# Patient Record
Sex: Female | Born: 1945 | Race: Black or African American | Hispanic: No | Smoking: Former smoker
Health system: Southern US, Community
[De-identification: ages and names within clinical notes are randomized; demographics above are authoritative.]

## PROBLEM LIST (undated history)

## (undated) ENCOUNTER — Ambulatory Visit: Payer: Medicare HMO

## (undated) DIAGNOSIS — C801 Malignant (primary) neoplasm, unspecified: Secondary | ICD-10-CM

## (undated) DIAGNOSIS — I82409 Acute embolism and thrombosis of unspecified deep veins of unspecified lower extremity: Secondary | ICD-10-CM

## (undated) DIAGNOSIS — Z87442 Personal history of urinary calculi: Secondary | ICD-10-CM

## (undated) DIAGNOSIS — T7840XA Allergy, unspecified, initial encounter: Secondary | ICD-10-CM

## (undated) DIAGNOSIS — D649 Anemia, unspecified: Secondary | ICD-10-CM

## (undated) DIAGNOSIS — C189 Malignant neoplasm of colon, unspecified: Secondary | ICD-10-CM

## (undated) DIAGNOSIS — I499 Cardiac arrhythmia, unspecified: Secondary | ICD-10-CM

## (undated) HISTORY — DX: Anemia, unspecified: D64.9

## (undated) HISTORY — PX: KIDNEY STONE SURGERY: SHX686

## (undated) HISTORY — PX: COLON SURGERY: SHX602

## (undated) HISTORY — PX: TONSILLECTOMY: SUR1361

## (undated) HISTORY — DX: Cardiac arrhythmia, unspecified: I49.9

## (undated) HISTORY — DX: Allergy, unspecified, initial encounter: T78.40XA

## (undated) HISTORY — PX: CHOLECYSTECTOMY: SHX55

## (undated) HISTORY — DX: Malignant neoplasm of colon, unspecified: C18.9

## (undated) HISTORY — PX: OTHER SURGICAL HISTORY: SHX169

## (undated) HISTORY — PX: COLONOSCOPY: SHX174

---

## 1978-06-25 DIAGNOSIS — Z5189 Encounter for other specified aftercare: Secondary | ICD-10-CM

## 1978-06-25 HISTORY — DX: Encounter for other specified aftercare: Z51.89

## 2004-02-06 ENCOUNTER — Other Ambulatory Visit: Payer: Self-pay

## 2004-11-30 ENCOUNTER — Emergency Department (HOSPITAL_COMMUNITY): Admission: EM | Admit: 2004-11-30 | Discharge: 2004-11-30 | Payer: Self-pay | Admitting: *Deleted

## 2005-09-28 ENCOUNTER — Emergency Department (HOSPITAL_COMMUNITY): Admission: EM | Admit: 2005-09-28 | Discharge: 2005-09-28 | Payer: Self-pay | Admitting: Emergency Medicine

## 2006-01-13 ENCOUNTER — Emergency Department (HOSPITAL_COMMUNITY): Admission: EM | Admit: 2006-01-13 | Discharge: 2006-01-13 | Payer: Self-pay | Admitting: Emergency Medicine

## 2008-03-11 ENCOUNTER — Emergency Department (HOSPITAL_COMMUNITY): Admission: EM | Admit: 2008-03-11 | Discharge: 2008-03-11 | Payer: Self-pay | Admitting: Emergency Medicine

## 2008-11-14 ENCOUNTER — Emergency Department (HOSPITAL_COMMUNITY): Admission: EM | Admit: 2008-11-14 | Discharge: 2008-11-14 | Payer: Self-pay | Admitting: Emergency Medicine

## 2010-10-03 LAB — PROTIME-INR
INR: 1 (ref 0.00–1.49)
Prothrombin Time: 13.9 seconds (ref 11.6–15.2)

## 2010-10-03 LAB — BASIC METABOLIC PANEL
CO2: 30 mEq/L (ref 19–32)
Calcium: 9.1 mg/dL (ref 8.4–10.5)
Creatinine, Ser: 0.6 mg/dL (ref 0.4–1.2)
GFR calc Af Amer: >60 mL/min (ref >60)

## 2010-10-03 LAB — CBC
Hemoglobin: 12.9 g/dL (ref 12.0–15.0)
MCV: 88.9 fL (ref 78.0–100.0)
RBC: 4.23 MIL/uL (ref 3.87–5.11)
WBC: 7.1 10*3/uL (ref 4.0–10.5)

## 2010-10-03 LAB — DIFFERENTIAL
Basophils Absolute: 0 10*3/uL (ref 0.0–0.1)
Basophils Relative: 1 % (ref 0–1)
Eosinophils Absolute: 0.2 10*3/uL (ref 0.0–0.7)
Monocytes Relative: 7 % (ref 3–12)
Neutro Abs: 3.1 10*3/uL (ref 1.7–7.7)

## 2011-03-16 ENCOUNTER — Emergency Department (HOSPITAL_COMMUNITY)
Admission: EM | Admit: 2011-03-16 | Discharge: 2011-03-16 | Disposition: A | Discharge status: Home / Self Care | Payer: BC Managed Care – PPO | Attending: Emergency Medicine | Admitting: Emergency Medicine

## 2011-03-16 DIAGNOSIS — R5383 Other fatigue: Secondary | ICD-10-CM | POA: Insufficient documentation

## 2011-03-16 DIAGNOSIS — R42 Dizziness and giddiness: Secondary | ICD-10-CM | POA: Insufficient documentation

## 2011-03-16 DIAGNOSIS — R5381 Other malaise: Secondary | ICD-10-CM | POA: Insufficient documentation

## 2011-03-16 DIAGNOSIS — N390 Urinary tract infection, site not specified: Secondary | ICD-10-CM | POA: Insufficient documentation

## 2011-03-16 DIAGNOSIS — K089 Disorder of teeth and supporting structures, unspecified: Secondary | ICD-10-CM | POA: Insufficient documentation

## 2011-03-16 LAB — CBC
Hemoglobin: 13.1 g/dL (ref 12.0–15.0)
MCH: 29.8 pg (ref 26.0–34.0)
Platelets: 288 10*3/uL (ref 150–400)
RBC: 4.39 MIL/uL (ref 3.87–5.11)
WBC: 7.6 10*3/uL (ref 4.0–10.5)

## 2011-03-16 LAB — URINALYSIS, ROUTINE W REFLEX MICROSCOPIC
Glucose, UA: NEGATIVE mg/dL
Ketones, ur: NEGATIVE mg/dL
Nitrite: POSITIVE — AB
Specific Gravity, Urine: 1.021 (ref 1.005–1.030)

## 2011-03-16 LAB — DIFFERENTIAL
Basophils Absolute: 0 10*3/uL (ref 0.0–0.1)
Basophils Relative: 0 % (ref 0–1)
Monocytes Absolute: 0.5 10*3/uL (ref 0.1–1.0)
Monocytes Relative: 7 % (ref 3–12)
Neutrophils Relative %: 44 % (ref 43–77)

## 2011-03-16 LAB — URINE MICROSCOPIC-ADD ON

## 2011-03-16 LAB — BASIC METABOLIC PANEL
BUN: 10 mg/dL (ref 6–23)
Chloride: 104 mEq/L (ref 96–112)
Creatinine, Ser: 0.71 mg/dL (ref 0.50–1.10)
Potassium: 3.3 mEq/L — ABNORMAL LOW (ref 3.5–5.1)

## 2011-03-20 LAB — URINE CULTURE: Colony Count: >=100000

## 2011-07-31 ENCOUNTER — Emergency Department (HOSPITAL_COMMUNITY)
Admission: EM | Admit: 2011-07-31 | Discharge: 2011-07-31 | Disposition: A | Discharge status: Home / Self Care | Payer: BC Managed Care – PPO | Attending: Emergency Medicine | Admitting: Emergency Medicine

## 2011-07-31 ENCOUNTER — Encounter (HOSPITAL_COMMUNITY): Payer: Self-pay | Admitting: Emergency Medicine

## 2011-07-31 DIAGNOSIS — K089 Disorder of teeth and supporting structures, unspecified: Secondary | ICD-10-CM | POA: Insufficient documentation

## 2011-07-31 DIAGNOSIS — K029 Dental caries, unspecified: Secondary | ICD-10-CM | POA: Insufficient documentation

## 2011-07-31 DIAGNOSIS — K0889 Other specified disorders of teeth and supporting structures: Secondary | ICD-10-CM

## 2011-07-31 MED ORDER — CLINDAMYCIN HCL 150 MG PO CAPS: 150.0000 mg | ORAL_CAPSULE | Freq: Four times a day (QID) | ORAL | Status: DC

## 2011-07-31 MED ORDER — IBUPROFEN 600 MG PO TABS: 600.0000 mg | ORAL_TABLET | Freq: Three times a day (TID) | ORAL | Status: DC | PRN

## 2011-07-31 MED ORDER — CLINDAMYCIN HCL 150 MG PO CAPS: 150.0000 mg | ORAL_CAPSULE | Freq: Four times a day (QID) | ORAL | Status: AC

## 2011-07-31 MED ORDER — HYDROCODONE-ACETAMINOPHEN 5-325 MG PO TABS: 1.0000 | ORAL_TABLET | ORAL | Status: AC | PRN

## 2011-07-31 MED ORDER — HYDROCODONE-ACETAMINOPHEN 5-500 MG PO TABS: 1.0000 | ORAL_TABLET | Freq: Four times a day (QID) | ORAL | Status: DC | PRN

## 2011-07-31 MED ORDER — OXYCODONE-ACETAMINOPHEN 5-325 MG PO TABS
1.0000 | ORAL_TABLET | Freq: Once | ORAL | Status: DC
  Filled 2011-07-31: qty 1

## 2011-07-31 MED ORDER — HYDROCODONE-ACETAMINOPHEN 5-325 MG PO TABS: 1.0000 | ORAL_TABLET | ORAL | Status: DC | PRN

## 2011-07-31 MED ORDER — ACETAMINOPHEN 325 MG PO TABS
650.0000 mg | ORAL_TABLET | Freq: Once | ORAL | Status: AC
  Administered 2011-07-31: 650 mg via ORAL
  Filled 2011-07-31: qty 2

## 2011-07-31 NOTE — ED Notes (Signed)
Pt reports cap came off her tooth several weeks ago and pain has gotten worse. States was at work last night and had BM at 330am and had another BM with blood this am prior to arrival. Pt states she saw red on the stool only, not a large amount, states tooth pain is her biggest problem. Pt denies abd pain but states her abd is sore sometimes.

## 2011-07-31 NOTE — ED Provider Notes (Signed)
History     CSN: [id number]  Arrival date & time 07/31/11  1191   First MD Initiated Contact with Patient 07/31/11 0920      Chief Complaint  Patient presents with  . Dental Pain     Patient is a 66 y.o. female presenting with tooth pain. The history is provided by the patient.  Dental Pain  the patient reports several weeks of worsening right upper tooth pain.  Her pain is constant.  She has not seen a dentist.  She has helped insurance and dental insurance however she has not seen a physician or a dentist in many years.  She's never had a colonoscopy or mammogram.  She reports that she does not like physicians or dentists and she does not like to followup with them.  She finally presented today because her pain became more severe.  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  Her pain is moderate to severe.  Her pain is worsened by chewing and movement.  Her pain is improved by nothing.  She does not like to take narcotics she only likes to take ibuprofen and Tylenol.  History reviewed. No pertinent past medical history.  Past Surgical History  Procedure Date  . Cholecystectomy     No family history on file.  History  Substance Use Topics  . Smoking status: Current Everyday Smoker  . Smokeless tobacco: Not on file  . Alcohol Use: No    OB History    Grav Para Term Preterm Abortions TAB SAB Ect Mult Living                  Review of Systems  All other systems reviewed and are negative.    Allergies  Aspirin and Penicillins  Home Medications   Current Outpatient Rx  Name Route Sig Dispense Refill  . ACETAMINOPHEN 500 MG PO TABS Oral Take 1,000 mg by mouth every 6 (six) hours as needed. For pain    . BENZOCAINE 7.5 % MT GEL Mouth/Throat Use as directed 1 application in the mouth or throat 3 (three) times daily as needed.    Marland Kitchen CLINDAMYCIN HCL 150 MG PO CAPS Oral Take 1 capsule (150 mg total) by mouth every 6 (six) hours. 28 capsule 0  . HYDROCODONE-ACETAMINOPHEN 5-325 MG  PO TABS Oral Take 1 tablet by mouth every 4 (four) hours as needed for pain. 15 tablet 0    BP 142/78  Pulse 72  Temp 98.1 F (36.7 C)  Resp 16  Ht 5\' 5"  (1.651 m)  Wt 160 lb (72.576 kg)  BMI 26.63 kg/m2  SpO2 100%  Physical Exam  Constitutional: She is oriented to person, place, and time. She appears well-developed and well-nourished.  HENT:       Poor dentition throughout.  The patient is missing several teeth.  She has tenderness of her right upper first molar with evidence of decay.  She has no gingival swelling or fluctuance.  Her posterior pharynx is patent.  She has no sublingual swelling  Eyes: EOM are normal.  Neck: Normal range of motion.  Pulmonary/Chest: Effort normal.  Musculoskeletal: Normal range of motion.  Neurological: She is alert and oriented to person, place, and time.  Psychiatric: She has a normal mood and affect.    ED Course  Procedures (including critical care time)  Labs Reviewed - No data to display No results found.   1. Pain, dental       MDM  Dental Pain. Home with  antibiotics and pain medicine. Recommend dental follow up. No signs of gingival abscess. Tolerating secretions. Airway patent. No sub lingular swelling         Lyanne Co, MD 07/31/11 [phone]

## 2011-10-02 ENCOUNTER — Emergency Department (HOSPITAL_COMMUNITY)
Admission: EM | Admit: 2011-10-02 | Discharge: 2011-10-02 | Disposition: A | Discharge status: Home Health | Payer: BC Managed Care – PPO | Attending: Emergency Medicine | Admitting: Emergency Medicine

## 2011-10-02 ENCOUNTER — Emergency Department (HOSPITAL_COMMUNITY): Payer: BC Managed Care – PPO

## 2011-10-02 ENCOUNTER — Encounter (HOSPITAL_COMMUNITY): Payer: Self-pay | Admitting: Emergency Medicine

## 2011-10-02 DIAGNOSIS — K639 Disease of intestine, unspecified: Secondary | ICD-10-CM

## 2011-10-02 DIAGNOSIS — K6389 Other specified diseases of intestine: Secondary | ICD-10-CM | POA: Insufficient documentation

## 2011-10-02 DIAGNOSIS — R1031 Right lower quadrant pain: Secondary | ICD-10-CM | POA: Insufficient documentation

## 2011-10-02 LAB — LIPASE, BLOOD: Lipase: 30 U/L (ref 11–59)

## 2011-10-02 LAB — URINE MICROSCOPIC-ADD ON

## 2011-10-02 LAB — COMPREHENSIVE METABOLIC PANEL
AST: 18 U/L (ref 0–37)
Albumin: 3.9 g/dL (ref 3.5–5.2)
Calcium: 9.6 mg/dL (ref 8.4–10.5)
Chloride: 104 mEq/L (ref 96–112)
Creatinine, Ser: 0.6 mg/dL (ref 0.50–1.10)
Total Protein: 7.2 g/dL (ref 6.0–8.3)

## 2011-10-02 LAB — DIFFERENTIAL
Basophils Absolute: 0 K/uL (ref 0.0–0.1)
Basophils Relative: 0 % (ref 0–1)
Eosinophils Absolute: 0.2 K/uL (ref 0.0–0.7)
Eosinophils Relative: 2 % (ref 0–5)
Lymphocytes Relative: 35 % (ref 12–46)
Lymphs Abs: 3.7 K/uL (ref 0.7–4.0)
Monocytes Absolute: 0.7 K/uL (ref 0.1–1.0)
Monocytes Relative: 6 % (ref 3–12)
Neutro Abs: 5.8 K/uL (ref 1.7–7.7)
Neutrophils Relative %: 56 % (ref 43–77)

## 2011-10-02 LAB — CBC
HCT: 38.7 % (ref 36.0–46.0)
Hemoglobin: 12.7 g/dL (ref 12.0–15.0)
MCH: 30 pg (ref 26.0–34.0)
MCHC: 32.8 g/dL (ref 30.0–36.0)
MCV: 91.5 fL (ref 78.0–100.0)
Platelets: 252 K/uL (ref 150–400)
RBC: 4.23 MIL/uL (ref 3.87–5.11)
RDW: 13.1 % (ref 11.5–15.5)
WBC: 10.4 K/uL (ref 4.0–10.5)

## 2011-10-02 LAB — URINALYSIS, ROUTINE W REFLEX MICROSCOPIC
Hgb urine dipstick: NEGATIVE
Specific Gravity, Urine: 1.014 (ref 1.005–1.030)

## 2011-10-02 MED ORDER — MORPHINE SULFATE 4 MG/ML IJ SOLN
4.0000 mg | Freq: Once | INTRAMUSCULAR | Status: AC
  Administered 2011-10-02: 4 mg via INTRAVENOUS
  Filled 2011-10-02: qty 1

## 2011-10-02 MED ORDER — ONDANSETRON 4 MG PO TBDP: 4.0000 mg | ORAL_TABLET | Freq: Three times a day (TID) | ORAL | Status: DC | PRN

## 2011-10-02 MED ORDER — ONDANSETRON HCL 4 MG/2ML IJ SOLN
4.0000 mg | Freq: Once | INTRAMUSCULAR | Status: AC
  Administered 2011-10-02: 4 mg via INTRAVENOUS
  Filled 2011-10-02: qty 2

## 2011-10-02 MED ORDER — HYDROCODONE-ACETAMINOPHEN 5-325 MG PO TABS: 1.0000 | ORAL_TABLET | ORAL | Status: DC | PRN

## 2011-10-02 MED ORDER — IOHEXOL 300 MG/ML  SOLN
100.0000 mL | Freq: Once | INTRAMUSCULAR | Status: AC | PRN
  Administered 2011-10-02: 100 mL via INTRAVENOUS

## 2011-10-02 MED ORDER — SODIUM CHLORIDE 0.9 % IV SOLN
Freq: Once | INTRAVENOUS | Status: AC
  Administered 2011-10-02: 03:00:00 via INTRAVENOUS

## 2011-10-02 NOTE — ED Provider Notes (Signed)
Medical screening examination/treatment/procedure(s) were conducted as a shared visit with non-physician practitioner(s) and myself.  I personally evaluated the patient during the encounter. Patient with right lower abdominal pain occurring during shift at work. Pain improves with curling up and worse with laying flat. CT scan with mass noted in the opposite side of abdomen, no specific cause for right-sided pain. Patient has been set up for further evaluation of this mass by GI tomorrow  Robin Mackie, MD 10/02/11 [phone]

## 2011-10-02 NOTE — ED Notes (Signed)
Per MD, Otter discontinued pelvic cart.

## 2011-10-02 NOTE — Discharge Instructions (Signed)
Your bloodwork today was normal. Your CT scan did not show an obvious cause for your pain today, but did show a possible lesion of the colon. You have been set up for an appointment with Willette Cluster, NP with [facility] tomorrow morning at 10 am. Please plan to arrive 15 minutes early for the appointment. You have been given medications to treat pain and nausea if needed. Return to the ER with worsening pain, high fever, nausea and vomiting not controlled by medication, or other worrisome symptoms.  Abdominal Pain Abdominal pain can be caused by many things. Your caregiver decides the seriousness of your pain by an examination and possibly blood tests and X-rays. Many cases can be observed and treated at home. Most abdominal pain is not caused by a disease and will probably improve without treatment. However, in many cases, more time must pass before a clear cause of the pain can be found. Before that point, it may not be known if you need more testing, or if hospitalization or surgery is needed. HOME CARE INSTRUCTIONS   Do not take laxatives unless directed by your caregiver.   Take pain medicine only as directed by your caregiver.   Only take over-the-counter or prescription medicines for pain, discomfort, or fever as directed by your caregiver.   Try a clear liquid diet (broth, tea, or water) for as long as directed by your caregiver. Slowly move to a bland diet as tolerated.  SEEK IMMEDIATE MEDICAL CARE IF:   The pain does not go away.   You have a fever.   You keep throwing up (vomiting).   The pain is felt only in portions of the abdomen. Pain in the right side could possibly be appendicitis. In an adult, pain in the left lower portion of the abdomen could be colitis or diverticulitis.   You pass bloody or black tarry stools.  MAKE SURE YOU:   Understand these instructions.   Will watch your condition.   Will get help right away if you are not doing well or get worse.  Document  Released: 03/21/2005 Document Revised: 05/31/2011 Document Reviewed: 01/28/2008 [facility] Patient Information 2012 [place], [place].

## 2011-10-02 NOTE — ED Provider Notes (Signed)
History     CSN: [id number]  Arrival date & time 10/02/11  0008   First MD Initiated Contact with Patient 10/02/11 0154      Chief Complaint  Patient presents with  . Abdominal Pain    (Consider location/radiation/quality/duration/timing/severity/associated sxs/prior treatment) HPI History from patient. 66 year old female presents with abdominal pain. 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 Pain is described as sharp and is constant in nature. Pain improves with sitting up or leaning forward. No treatment prior to arrival. This was accompanied by a brief episode of nausea without emesis. She has had no diarrhea, urinary symptoms, or vaginal discharge or bleeding. Denies fever, chills. Surgical history is pertinent for cholecystectomy.  History reviewed. No pertinent past medical history.  Past Surgical History  Procedure Date  . Cholecystectomy   . Goiter removal      Family History  Problem Relation Age of Onset  . Cancer Mother   . Hypertension Other     History  Substance Use Topics  . Smoking status: Current Everyday Smoker  . Smokeless tobacco: Not on file  . Alcohol Use: No    OB History    Grav Para Term Preterm Abortions TAB SAB Ect Mult Living                  Review of Systems  Constitutional: Negative for fever, chills, activity change, appetite change and unexpected weight change.  Respiratory: Negative for chest tightness and shortness of breath.   Cardiovascular: Negative for chest pain and palpitations.  Gastrointestinal: Negative for nausea, vomiting and abdominal pain.  Musculoskeletal: Negative for myalgias.  Skin: Negative for color change and rash.  Neurological: Negative for dizziness and weakness.    Allergies  Aspirin; Advil; and Penicillins  Home Medications   Current Outpatient  Rx  Name Route Sig Dispense Refill  . ACETAMINOPHEN 500 MG PO TABS Oral Take 1,000 mg by mouth every 6 (six) hours as needed. For pain      BP 136/68  Pulse 52  Temp(Src) 97.7 F (36.5 C) (Oral)  Resp 16  Ht 5\' 5"  (1.651 m)  Wt 166 lb (75.297 kg)  BMI 27.62 kg/m2  SpO2 99%  Physical Exam  Nursing note and vitals reviewed. Constitutional: She is oriented to person, place, and time. She appears well-developed and well-nourished. No distress.  HENT:  Head: Normocephalic and atraumatic.  Right Ear: External ear normal.  Left Ear: External ear normal.  Eyes: EOM are normal. Pupils are equal, round, and reactive to light.  Neck: Normal range of motion.  Cardiovascular: Normal rate, regular rhythm and normal heart sounds.   Pulmonary/Chest: Effort normal and breath sounds normal. She exhibits no tenderness.  Abdominal: Soft. Bowel sounds are normal. There is no rebound and no guarding.       Mild tenderness to palpation to suprapubic area of her midline and right lower quadrant. No palpable mass. No rebound or guarding. More uncomfortable with lying flat.  Musculoskeletal: Normal range of motion.  Neurological: She is alert and oriented to person, place, and time.  Skin: Skin is warm and dry. No rash noted. She is not diaphoretic.  Psychiatric: She has a normal mood and affect.    ED Course  Procedures (including critical care time)  Labs Reviewed  URINALYSIS, ROUTINE W REFLEX MICROSCOPIC - Abnormal;  Notable for the following:    Leukocytes, UA MODERATE (*)    All other components within normal limits  URINE MICROSCOPIC-ADD ON - Abnormal; Notable for the following:    Squamous Epithelial / LPF FEW (*)    Bacteria, UA MANY (*)    All other components within normal limits  CBC  DIFFERENTIAL  COMPREHENSIVE METABOLIC PANEL  LIPASE, BLOOD  URINE CULTURE   Ct Abdomen Pelvis W Contrast  10/02/2011  **ADDENDUM** CREATED: 10/02/2011 08:23:45  These results were called by telephone on  10/02/2011  at  0820 hours to  Dr. Oletta Lamas, who verbally acknowledged these results.  **END ADDENDUM** SIGNED BY: Charline Bills, M.D.    10/02/2011  *RADIOLOGY REPORT*  Clinical Data: Right lower quadrant abdominal pain radiating to back, nausea  CT ABDOMEN AND PELVIS WITH CONTRAST  Technique:  Multidetector CT imaging of the abdomen and pelvis was performed following the standard protocol during bolus administration of intravenous contrast.  Contrast: OMNIPAQUE IOHEXOL 300 MG/ML  SOLN  Comparison: None.  Findings: Patchy atelectasis at the lung bases.  1.8 cm hemangioma at the right hepatic dome (series 2/image 13). 1.1 cm cyst in the lateral segment left hepatic lobe (series 2/image 19).  9 mm hypoenhancing splenic lesion (series 2/image 18), likely benign.  Pancreas and adrenal glands are within normal limits.  Status post cholecystectomy.  No intrahepatic ductal dilatation.  Small bilateral renal cysts measuring up to 11 mm (series 7/image 16).  No hydronephrosis.  No evidence of bowel obstruction.  Wall thickening involving a loop of jejunum in the left lower abdomen (for example, series 2/image 57), presumed to be related to peristalsis.  Normal appendix. 3.3 x 4.0 cm soft tissue lesion in the distal transverse colon (series 2/image 33), worrisome for possible primary colonic neoplasm.  Atherosclerotic calcifications of the abdominal aorta and branch vessels.  No suspicious abdominopelvic lymphadenopathy.  Small volume pelvic ascites.  The uterus is notable for a prominent endometrium, measuring 2.1 cm (series 2/image 73), possibly corresponding to biopsy-proven endometrial polyp although poorly evaluated by CT.  Dystrophic calcifications along the left aspect of the uterus.  No adnexal masses.  Bladder is within normal limits.  Degenerative changes of the visualized thoracolumbar spine.  IMPRESSION:  3.3 x 4.0 cm soft tissue lesion in the distal transverse colon, worrisome for possible primary colonic  neoplasm.  Colonoscopy is suggested for further evaluation.  Normal appendix.  No evidence of bowel obstruction.  Endometrium measures 2.1 cm, enlarged, possibly corresponding to biopsy-proven endometrial polyp although poorly evaluated by CT.  Original Report Authenticated By: Charline Bills, M.D.     1. Abdominal pain   2. Lesion of colon       MDM  Pt with abd pain, located most in the RLQ. Labs are generally unremarkable. She does have moderate leukocytes in her urine with 11-20 white cells. Urine is sent for culture. Given the sudden onset of her pain, I am unsure that this is the etiology of her pain. Based on her presentation, we did proceed with a CT of the abdomen and pelvis. This showed an incidental finding of a 3.3 x 4.0 soft tissue lesion in the distal transverse colon which is worrisome for possible primary colonic malignancy. The patient does not have a primary care doctor whom she sees. I contacted the PA on-call for [facility] to secure followup for the patient. An appointment was made for tomorrow morning at 10 AM. I discussed the CT findings with the patient  and the importance of keeping followup appointment with GI. She verbalized understanding and stated that she would be sure to keep the appointment. Given prescriptions for pain and nausea medicine. Return precautions discussed.        Grant Fontana, [place] 10/02/11 [id number]

## 2011-10-02 NOTE — ED Notes (Signed)
Pt states she was at work tonight and started having abd pain that goes into her back  Pt states pain is worse when she stands up straight  Pt states the pain is mainly in her lower abdomen and radiates around to her back  Pt feels nausea but has not had any vomiting

## 2011-10-03 ENCOUNTER — Encounter: Payer: Self-pay | Admitting: Gastroenterology

## 2011-10-03 ENCOUNTER — Encounter: Payer: Self-pay | Admitting: Nurse Practitioner

## 2011-10-03 ENCOUNTER — Ambulatory Visit (INDEPENDENT_AMBULATORY_CARE_PROVIDER_SITE_OTHER): Payer: BC Managed Care – PPO | Admitting: Nurse Practitioner

## 2011-10-03 VITALS — BP 128/80 | HR 64 | Ht 65.0 in | Wt 166.0 lb

## 2011-10-03 DIAGNOSIS — R933 Abnormal findings on diagnostic imaging of other parts of digestive tract: Secondary | ICD-10-CM

## 2011-10-03 MED ORDER — MOVIPREP 100 G PO SOLR: ORAL | Status: DC

## 2011-10-03 NOTE — Patient Instructions (Addendum)
You have been given a separate informational sheet regarding your tobacco use, the importance of quitting and local resources to help you quit.  We scheduled the Colonoscopy with Dr. Rob Bunting on 10-16-2011. Directions provided.  Colonoscopy A colonoscopy is an exam to evaluate your entire colon. In this exam, your colon is cleansed. A long fiberoptic tube is inserted through your rectum and into your colon. The fiberoptic scope (endoscope) is a long bundle of enclosed and very flexible fibers. These fibers transmit light to the area examined and send images from that area to your caregiver. Discomfort is usually minimal. You may be given a drug to help you sleep (sedative) during or prior to the procedure. This exam helps to detect lumps (tumors), polyps, inflammation, and areas of bleeding. Your caregiver may also take a small piece of tissue (biopsy) that will be examined under a microscope. LET YOUR CAREGIVER KNOW ABOUT:   Allergies to food or medicine.   Medicines taken, including vitamins, herbs, eyedrops, over-the-counter medicines, and creams.   Use of steroids (by mouth or creams).   Previous problems with anesthetics or numbing medicines.   History of bleeding problems or blood clots.   Previous surgery.   Other health problems, including diabetes and kidney problems.   Possibility of pregnancy, if this applies.  BEFORE THE PROCEDURE   A clear liquid diet may be required for 2 days before the exam.   Ask your caregiver about changing or stopping your regular medications.   Liquid injections (enemas) or laxatives may be required.   A large amount of electrolyte solution may be given to you to drink over a short period of time. This solution is used to clean out your colon.   You should be present 60 minutes prior to your procedure or as directed by your caregiver.  AFTER THE PROCEDURE   If you received a sedative or pain relieving medication, you will need to arrange  for someone to drive you home.   Occasionally, there is a little blood passed with the first bowel movement. Do not be concerned.  FINDING OUT THE RESULTS OF YOUR TEST Not all test results are available during your visit. If your test results are not back during the visit, make an appointment with your caregiver to find out the results. Do not assume everything is normal if you have not heard from your caregiver or the medical facility. It is important for you to follow up on all of your test results. HOME CARE INSTRUCTIONS   It is not unusual to pass moderate amounts of gas and experience mild abdominal cramping following the procedure. This is due to air being used to inflate your colon during the exam. Walking or a warm pack on your belly (abdomen) may help.   You may resume all normal meals and activities after sedatives and medicines have worn off.   Only take over-the-counter or prescription medicines for pain, discomfort, or fever as directed by your caregiver. Do not use aspirin or blood thinners if a biopsy was taken. Consult your caregiver for medicine usage if biopsies were taken.  SEEK IMMEDIATE MEDICAL CARE IF:   You have a fever.   You pass large blood clots or fill a toilet with blood following the procedure. This may also occur 10 to 14 days following the procedure. This is more likely if a biopsy was taken.   You develop abdominal pain that keeps getting worse and cannot be relieved with medicine.  Document Released:  06/08/2000 Document Revised: 05/31/2011 Document Reviewed: 01/22/2008 [facility] Patient Information 2012 [place], [place].

## 2011-10-03 NOTE — Progress Notes (Signed)
10/03/2011 [name] [id number] [birth year]   HISTORY OF PRESENT ILLNESS: Patient is a 66 year old female who went to ED yesterday for evaluation of RLQ pain. Labs including CBC,CMET, and lipase were normal. CT scan of the abdomen and pelvis with contrast revealed a 3.3 x 4.0 cm soft tissue lesion in the distal transverse colon and some wall thickening involving a loop of jejunum in the left lower abdomen, presumed to be related to peristalsis. No suspicious lymphadenopathy. Patient has not had any bowel changes or rectal bleeding. No weight loss, in fact she has gained weight.   History reviewed. No pertinent past medical history. Past Surgical History  Procedure Date  . Cholecystectomy   . Goiter removal      reports that she has been smoking.  She has never used smokeless tobacco. She reports that she does not drink alcohol or use illicit drugs. family history includes Breast cancer in her mother; Heart disease in her mother; and Hypertension in her other. Allergies  Allergen Reactions  . Aspirin Nausea And Vomiting  . Advil (Ibuprofen) Palpitations  . Penicillins Rash      Outpatient Encounter Prescriptions as of 10/03/2011  Medication Sig Dispense Refill  . acetaminophen (TYLENOL) 500 MG tablet Take 1,000 mg by mouth every 6 (six) hours as needed. For pain      . DISCONTD: HYDROcodone-acetaminophen (NORCO) 5-325 MG per tablet Take 1 tablet by mouth every 4 (four) hours as needed for pain.  20 tablet  0  . DISCONTD: ondansetron (ZOFRAN ODT) 4 MG disintegrating tablet Take 1 tablet (4 mg total) by mouth every 8 (eight) hours as needed for nausea.  20 tablet  0    REVIEW OF SYSTEMS  : positive for headaches. All other systems reviewed and negative except where noted in the History of Present Illness.  PHYSICAL EXAM: BP 128/80  Pulse 64  Ht 5\' 5"  (1.651 m)  Wt 166 lb (75.297 kg)  BMI 27.62 kg/m2 General: Well developed black female in no acute distress Head: Normocephalic  and atraumatic Eyes:  sclerae anicteric,conjunctive pink. Ears: Normal auditory acuity Mouth: No deformity or lesions Neck: Supple, no masses.  Lungs: Clear throughout to auscultation Heart: Regular rate and rhythm Abdomen: Soft, non distended, mild to moderate right lower quadrant tenderness. No masses or hepatomegaly noted. Normal Bowel sounds Rectal: deferred until time of colon Musculoskeletal: Symmetrical with no gross deformities  Skin: No lesions on visible extremities Extremities: No edema or deformities noted Neurological: Alert oriented, grossly nonfocal Cervical Nodes:  No significant cervical adenopathy Psychological:  Alert and cooperative. Normal mood and affect  ASSESSMENT AND PLAN;   Colon mass. CT scan in the ED done for RLQ pain reveals a  3.3 x 4.0 cm distal transverse colon mass   Patient needs a colonoscopy for further evaluation. The risks, benefits, and alternatives to colonoscopy with possible biopsy and possible polypectomy were discussed with the patient and she consents to proceed.

## 2011-10-04 NOTE — Progress Notes (Signed)
i agree with the plan outlined in this note 

## 2011-10-16 ENCOUNTER — Encounter: Payer: Self-pay | Admitting: Gastroenterology

## 2011-10-16 ENCOUNTER — Ambulatory Visit (AMBULATORY_SURGERY_CENTER): Payer: BC Managed Care – PPO | Admitting: Gastroenterology

## 2011-10-16 VITALS — BP 167/74 | HR 68 | Temp 97.7°F | Resp 28 | Ht 65.0 in | Wt 166.0 lb

## 2011-10-16 DIAGNOSIS — D126 Benign neoplasm of colon, unspecified: Secondary | ICD-10-CM

## 2011-10-16 DIAGNOSIS — R933 Abnormal findings on diagnostic imaging of other parts of digestive tract: Secondary | ICD-10-CM

## 2011-10-16 DIAGNOSIS — K5730 Diverticulosis of large intestine without perforation or abscess without bleeding: Secondary | ICD-10-CM

## 2011-10-16 MED ORDER — SODIUM CHLORIDE 0.9 % IV SOLN: 500.0000 mL | INTRAVENOUS | Status: DC

## 2011-10-16 NOTE — Patient Instructions (Addendum)
One of your biggest health concerns is your smoking.  This increases your risk for most cancers and serious cardiovascular diseases such as strokes, heart attacks.  You should try your best to stop.  If you need assistance, please contact your PCP or Smoking Cessation Class at [facility] [phone]) or [place] (1-800-QUIT-NOW). YOU HAD AN ENDOSCOPIC PROCEDURE TODAY AT [facility]: Refer to the procedure report that was given to you for any specific questions about what was found during the examination.  If the procedure report does not answer your questions, please call your gastroenterologist to clarify.  If you requested that your care partner not be given the details of your procedure findings, then the procedure report has been included in a sealed envelope for you to review at your convenience later.  YOU SHOULD EXPECT: Some feelings of bloating in the abdomen. Passage of more gas than usual.  Walking can help get rid of the air that was put into your GI tract during the procedure and reduce the bloating. If you had a lower endoscopy (such as a colonoscopy or flexible sigmoidoscopy) you may notice spotting of blood in your stool or on the toilet paper. If you underwent a bowel prep for your procedure, then you may not have a normal bowel movement for a few days.  DIET: Your first meal following the procedure should be a light meal and then it is ok to progress to your normal diet.  A half-sandwich or bowl of soup is an example of a good first meal.  Heavy or fried foods are harder to digest and may make you feel nauseous or bloated.  Likewise meals heavy in dairy and vegetables can cause extra gas to form and this can also increase the bloating.  Drink plenty of fluids but you should avoid alcoholic beverages for 24 hours.  ACTIVITY: Your care partner should take you home directly after the procedure.  You should plan to take it easy, moving slowly for the rest of the  day.  You can resume normal activity the day after the procedure however you should NOT DRIVE or use heavy machinery for 24 hours (because of the sedation medicines used during the test).    SYMPTOMS TO REPORT IMMEDIATELY: A gastroenterologist can be reached at any hour.  During normal business hours, 8:30 AM to 5:00 PM Monday through Friday, call [phone].  After hours and on weekends, please call the GI answering service at [phone] who will take a message and have the physician on call contact you.   Following lower endoscopy (colonoscopy or flexible sigmoidoscopy):  Excessive amounts of blood in the stool  Significant tenderness or worsening of abdominal pains  Swelling of the abdomen that is new, acute  Fever of 100F or higher  Following upper endoscopy (EGD)  Vomiting of blood or coffee ground material  New chest pain or pain under the shoulder blades  Painful or persistently difficult swallowing  New shortness of breath  Fever of 100F or higher  Black, tarry-looking stools  FOLLOW UP: If any biopsies were taken you will be contacted by phone or by letter within the next 1-3 weeks.  Call your gastroenterologist if you have not heard about the biopsies in 3 weeks.  Our staff will call the home number listed on your records the next business day following your procedure to check on you and address any questions or concerns that you may have at that time regarding the information  given to you following your procedure. This is a courtesy call and so if there is no answer at the home number and we have not heard from you through the emergency physician on call, we will assume that you have returned to your regular daily activities without incident.  SIGNATURES/CONFIDENTIALITY: You and/or your care partner have signed paperwork which will be entered into your electronic medical record.  These signatures attest to the fact that that the information above on your After Visit  Summary has been reviewed and is understood.  Full responsibility of the confidentiality of this discharge information lies with you and/or your care-partner.

## 2011-10-16 NOTE — Progress Notes (Signed)
Patient did not have preoperative order for IV antibiotic SSI prophylaxis. (G8918)  Patient did not experience any of the following events: a burn prior to discharge; a fall within the facility; wrong site/side/patient/procedure/implant event; or a hospital transfer or hospital admission upon discharge from the facility. (G8907)  

## 2011-10-16 NOTE — Op Note (Signed)
[facility] [address] [place], [place]  [address]  COLONOSCOPY PROCEDURE REPORT  PATIENT:  [name], [name]  MR#:  [id number] BIRTHDATE:  27-Jul-1945, 65 yrs. old  GENDER:  female ENDOSCOPIST:  Rachael Fee, MD PROCEDURE DATE:  10/16/2011 PROCEDURE:  Colonoscopy with biopsy and snare polypectomy, Colonoscopy with submucosal injection ASA CLASS:  Class II INDICATIONS:  recent CT scan, done for abd pain, showed mass in transverse colon MEDICATIONS:   Fentanyl 75 mcg IV, These medications were titrated to patient response per physician's verbal order, Versed 7 mg IV  DESCRIPTION OF PROCEDURE:   After the risks benefits and alternatives of the procedure were thoroughly explained, informed consent was obtained.  Digital rectal exam was performed and revealed no rectal masses.   The LB [id number]-[id number] endoscope was introduced through the anus and advanced to the cecum, which was identified by both the appendix and ileocecal valve, without limitations.  The quality of the prep was good..  The instrument was then slowly withdrawn as the colon was fully examined. <<PROCEDUREIMAGES>>  FINDINGS:  There was a 4cm, nearly circumferential, soft, villous but nodular mass in distal transverse colon. This is clearly neoplastic. Biopsies taken (path jar 2) and then the site was labeled with [place] Ink injection on distal edge of mass (see image1 and image8).  There were two sessile polyps. One was 3mm across, located in transverse colon, removed with cold snare (pathology jar 1). The other was 7mm, semipedunculated, removed with snare/cautery, located in sigmoid colon (path jar 1) (see image5 and image10).  Mild diverticulosis was found in the sigmoid to descending colon segments (see image12).  This was otherwise a normal examination of the colon (see image13, image2, and image4). Retroflexed views in the rectum revealed no abnormalities. COMPLICATIONS:  None  ENDOSCOPIC  IMPRESSION: 1) Neoplastic, villous, nodular (but soft) circumferential 4cm mass in distal transverse colon. This was biopsied and then the site was labeled with [place] Ink. 2) Two polyps, both were removed and sent to pathology 3) Mild diverticulosis in the sigmoid to descending colon segments 4) Otherwise normal examination  RECOMMENDATIONS: Dr. Christella Hartigan' office will set up further staging (CT scan chest, CEA level) as well as an expedited referral to [facility] to consider segmental resection.  ______________________________ Rachael Fee, MD  n. eSIGNED:   Rachael Fee at 10/16/2011 11:36 AM  [place], Steward [place], [phone]

## 2011-10-17 ENCOUNTER — Telehealth: Payer: Self-pay

## 2011-10-17 DIAGNOSIS — R933 Abnormal findings on diagnostic imaging of other parts of digestive tract: Secondary | ICD-10-CM

## 2011-10-17 NOTE — Telephone Encounter (Signed)
Unable to leave message. No answer

## 2011-10-18 ENCOUNTER — Telehealth: Payer: Self-pay

## 2011-10-18 NOTE — Telephone Encounter (Signed)
blanca to notify pt  

## 2011-10-18 NOTE — Telephone Encounter (Signed)
I spoke with the pt's son and asked him if he could please have the pt call me by 5 today so that I can give her the information for her CT scan

## 2011-10-18 NOTE — Telephone Encounter (Signed)
Message copied by Donata Duff on Thu Oct 18, 2011 10:37 AM ------      Message from: Marnette Burgess      Created: Thu Oct 18, 2011 10:31 AM      Regarding: Referral      Contact: [phone]       Patient scheduled to see Dr. Harriette Bouillon on 11/08/11 @ 3:30pm, arrive @ 3:00pm.  If you have any questions please call.            Thank You,      Elane Fritz      ----- Message -----         From: Donata Duff, CMA         Sent: 10/17/2011   4:40 PM           To: Marnette Burgess            Pt needs expedited appt for segmental resection for Neoplastic villous nodular circumferential 4 cm mass in distal transverse colon

## 2011-10-18 NOTE — Telephone Encounter (Signed)
Pt has been given her lab and CT appointment she was also given her [facility] appointment

## 2011-10-18 NOTE — Telephone Encounter (Signed)
With IV contrast only for CT scan per Dr Christella Hartigan

## 2011-10-18 NOTE — Telephone Encounter (Signed)
No answer on home phone and no voice mail 

## 2011-10-19 ENCOUNTER — Other Ambulatory Visit: Payer: BC Managed Care – PPO

## 2011-10-19 ENCOUNTER — Ambulatory Visit (HOSPITAL_COMMUNITY)
Admission: RE | Admit: 2011-10-19 | Discharge: 2011-10-19 | Disposition: A | Discharge status: Home / Self Care | Payer: BC Managed Care – PPO | Source: Ambulatory Visit | Attending: Gastroenterology | Admitting: Gastroenterology

## 2011-10-19 ENCOUNTER — Telehealth: Payer: Self-pay | Admitting: Gastroenterology

## 2011-10-19 DIAGNOSIS — D739 Disease of spleen, unspecified: Secondary | ICD-10-CM | POA: Insufficient documentation

## 2011-10-19 DIAGNOSIS — C189 Malignant neoplasm of colon, unspecified: Secondary | ICD-10-CM | POA: Insufficient documentation

## 2011-10-19 DIAGNOSIS — R933 Abnormal findings on diagnostic imaging of other parts of digestive tract: Secondary | ICD-10-CM

## 2011-10-19 DIAGNOSIS — D1803 Hemangioma of intra-abdominal structures: Secondary | ICD-10-CM | POA: Insufficient documentation

## 2011-10-19 DIAGNOSIS — R918 Other nonspecific abnormal finding of lung field: Secondary | ICD-10-CM | POA: Insufficient documentation

## 2011-10-19 DIAGNOSIS — K7689 Other specified diseases of liver: Secondary | ICD-10-CM | POA: Insufficient documentation

## 2011-10-19 MED ORDER — IOHEXOL 300 MG/ML  SOLN
80.0000 mL | Freq: Once | INTRAMUSCULAR | Status: AC | PRN
  Administered 2011-10-19: 80 mL via INTRAVENOUS

## 2011-10-19 NOTE — Telephone Encounter (Signed)
Dr Christella Hartigan spoke with the pt

## 2011-11-08 ENCOUNTER — Encounter (INDEPENDENT_AMBULATORY_CARE_PROVIDER_SITE_OTHER): Payer: Self-pay

## 2011-11-08 ENCOUNTER — Ambulatory Visit (INDEPENDENT_AMBULATORY_CARE_PROVIDER_SITE_OTHER): Payer: BC Managed Care – PPO | Admitting: Surgery

## 2011-11-08 ENCOUNTER — Encounter (INDEPENDENT_AMBULATORY_CARE_PROVIDER_SITE_OTHER): Payer: Self-pay | Admitting: Surgery

## 2011-11-08 VITALS — BP 140/80 | HR 72 | Temp 97.2°F | Ht 65.0 in | Wt 164.8 lb

## 2011-11-08 DIAGNOSIS — C189 Malignant neoplasm of colon, unspecified: Secondary | ICD-10-CM

## 2011-11-08 NOTE — Progress Notes (Signed)
Patient ID: [name], female   DOB: June 17, 1946, 66 y.o.   MRN: [id number]  No chief complaint on file.   HPI [name] is a 66 y.o. female.   HPI Patient sent at the request of Dr. Christella Hartigan due to a colon mass in the distal transverse colon. She's having abdominal pain about a month ago and was seen in the emergency room. She was found to have a lesion involving her distal transverse colon. Colonoscopy is performed for which showed a circumferential 4 cm mass core biopsy proven to be invasive carcinoma. CT was otherwise normal. Patient denies any recent weight loss, blood or stool or loss of appetite. She feels well today. Past Medical History  Diagnosis Date  . Kidney stone   . Abdominal pain     Past Surgical History  Procedure Date  . Cholecystectomy   . Goiter removal    . Kidney stone surgery     Family History  Problem Relation Age of Onset  . Breast cancer Mother   . Heart disease Mother   . Hypertension Other     Social History History  Substance Use Topics  . Smoking status: Current Everyday Smoker -- 0.2 packs/day    Types: Cigarettes  . Smokeless tobacco: Never Used  . Alcohol Use: No    Allergies  Allergen Reactions  . Aspirin Nausea And Vomiting  . Advil (Ibuprofen) Palpitations  . Penicillins Rash    No current outpatient prescriptions on file.   Current Facility-Administered Medications  Medication Dose Route Frequency Provider Last Rate Last Dose  . 0.9 %  sodium chloride infusion  500 mL Intravenous Continuous Rachael Fee, MD        Review of Systems Review of Systems  Constitutional: Negative for fever, chills and unexpected weight change.  HENT: Negative for hearing loss, congestion, sore throat, trouble swallowing and voice change.   Eyes: Negative for visual disturbance.  Respiratory: Negative for cough and wheezing.   Cardiovascular: Negative for chest pain, palpitations and leg swelling.  Gastrointestinal: Negative for  nausea, vomiting, abdominal pain, diarrhea, constipation, blood in stool, abdominal distention and anal bleeding.  Genitourinary: Negative for hematuria, vaginal bleeding and difficulty urinating.  Musculoskeletal: Negative for arthralgias.  Skin: Negative for rash and wound.  Neurological: Negative for seizures, syncope and headaches.  Hematological: Negative for adenopathy. Does not bruise/bleed easily.  Psychiatric/Behavioral: Negative for confusion.    Blood pressure 140/80, pulse 72, temperature 97.2 F (36.2 C), height 5\' 5"  (1.651 m), weight 164 lb 12.8 oz (74.753 kg).  Physical Exam Physical Exam  Constitutional: She is oriented to person, place, and time. She appears well-developed and well-nourished.  HENT:  Head: Normocephalic and atraumatic.  Eyes: EOM are normal. Pupils are equal, round, and reactive to light.  Neck: Normal range of motion. Neck supple.  Cardiovascular: Normal rate and regular rhythm.   Pulmonary/Chest: Effort normal and breath sounds normal.  Abdominal: Soft. Bowel sounds are normal. She exhibits no distension. There is no tenderness.  Musculoskeletal: Normal range of motion.  Neurological: She is alert and oriented to person, place, and time.  Skin: Skin is warm and dry.    Data Reviewed Colonoscopy   Lesion at distal transverse colon biopsy proven adenocarcinoma it was tattooed.  CT lesion distal  transverse colon no evidence of metastasis   Assessment    Colon cancer distal transverse colon    Plan    Laparoscopic partial colectomy.The procedure was discussed with the patient.  Laparoscopic partial colectomy discussed with the patient as well as non operative treatments. The risks of operative management include bleeding,  Infection,  Leak of anastamosis,  Ostomy formation, open procedure,  Sepsis,  Abcess,  Hernia,  DVT,  Pulmonary complications,  Cardiovascular  complications,  Injury to ureter,  Bladder,kidney,and anesthesia risks,  And  death. The patient understands.  Questions answered.   The success of the procedure is 50-100% for treating the patients symptoms. They agree to proceed.       Docia Klar A. 11/08/2011, 4:22 PM

## 2011-11-08 NOTE — Patient Instructions (Signed)
Laparoscopic Colon Resection Laparoscopic colon resection is a relatively new procedure and is not performed in all centers. It may be done to remove a piece of the colon (large intestine) that may be sore and reddened (inflamed). It may be done to remove a portion of bowel that is blocked. The intestine may be blocked because of colon cancer. It is sometimes used to treat diseases of the bowel in which there are multiple small outgrowths from the bowel wall (polyps), which may predispose a person to cancer. LET YOUR CAREGIVER KNOW ABOUT:  Allergies.   Medications taken including herbs, eye drops, over the counter medications, and creams.   Use of steroids (by mouth or creams).   Previous problems with anesthetics or novocaine.   Possibility of pregnancy, if this applies.   History of blood clots (thrombophlebitis).   History of bleeding or blood problems.   Previous surgery.   Other health problems.  RISKS AND COMPLICATIONS Some problems, which occur following this procedure, include:  Infection: A germ starts growing in the wound. This can usually be treated with medicine that kills germs (antibiotics).   Bleeding following surgery may be a complication of almost all surgeries. Your surgeon takes every precaution to keep this from happening.   Damage to other organs may occur. If damage to other organs or excessive bleeding should occur it may be necessary to convert the laparoscopic procedure into an open abdominal (belly) procedure. This means the surgery is performed by opening the abdomen and performing the surgery under direct vision. Scarring from previous surgeries or disease may also be a cause to change this procedure to an open abdominal operation.   Sometimes a leak can occur in the line where the bowel was sewn together after the portion of bowel was removed.   It is possible for the bowel to become obstructed in the area where it was sewn together. When this happens, it  is sometimes necessary to operate again to repair this. This may be accomplished using the laparoscope or opening the abdomen and operating in the usual manner without the laparoscope.  BEFORE THE PROCEDURE You should be present 2 hours prior to your procedure or as instructed.  PROCEDURE  Laparoscopic means a laparoscope (a small pencil sized telescope) is used. You are made to sleep with medicine (anesthetized). Your surgeon inflates your belly (abdomen) with a needle like device (trocar and cannula). The inflation is done with a harmless gas (carbon dioxide). This makes your organs easier to see. The laparoscope is inserted into your abdomen through a small slit (incision) that allows your surgeon to see into the abdomen. Other small instruments, such as probes and operating instruments, are inserted into the abdomen through other small openings (ports). These ports allow the surgeon to perform the operation. Often surgeons attach a video camera to the laparoscope to enlarge the view. During the procedure the portion of bowel to be removed is taken out through one of the ports. A port may have to be enlarged if the bowel is too large to be removed. In this case a small incision will be made and some times the bowel is reconnected (anastamosis) outside the abdomen. After the procedure, the gas is released, and your incisions are closed with stitches (sutures). Because these incisions are small (usually less than one-half inch), there is usually minimal discomfort following the procedure. AFTER THE PROCEDURE The recovery time, if there are no problems, is shortened compared to regular surgery. You will rest in   a recovery room until you are stable and doing well. Following this, barring other problems you will be allowed to return to your room. Recovery times vary depending on what is found at surgery, the age of the patient, general health, etc. SEEK IMMEDIATE MEDICAL CARE IF:   There is redness, swelling,  or increasing pain in the wound area.   Pus is coming from the wound.   An unexplained oral temperature above 102 F (38.9 C) develops or as directed.   You notice a foul smell coming from the wound or dressing.   There is a breaking open of a wound (edges not staying together) after sutures have been removed.   You develop increasing abdominal pain.  Document Released: 09/01/2002 Document Revised: 05/31/2011 Document Reviewed: 07/11/2007 [facility] Patient Information 2012 [place], [place].   Laparoscopic Colon Resection  Care After Please read the instructions outlined below. Refer to these instructions for the next few weeks. These discharge instructions provide you with general information on caring for yourself after surgery. Your caregiver may also give you specific instructions. While your treatment has been planned according to the most current medical practices available, unavoidable complications occasionally occur. If you have any problems or questions after discharge, please call your caregiver. HOME CARE INSTRUCTIONS   It will be normal to be sore for a couple weeks following surgery. See your caregiver if this seems to be getting worse rather than better.   Only take over-the-counter or prescription medicines for pain, discomfort, or fever as directed by your caregiver.   Use showers for bathing, until seen or as instructed.   Change dressings if necessary or as directed.   You may resume normal diet and activities as directed or allowed.   Avoid lifting or driving until you are instructed otherwise.   Make an appointment to see your surgeon for suture (stitches) or staple removal when instructed.   You may use ice on your incision for twenty minutes four times per day for the first two days.  SEEK MEDICAL CARE IF:   You have increased bleeding from wounds.   You see redness, swelling, or have increasing pain in wounds.   You have pus coming from a wound.   You  develop an unexplained temperature over 101 F (38.3 C).   You have a foul smell coming from the wound or dressing.   There is a breaking open of a wound (edges not staying together) after sutures or staples have been removed.  SEEK IMMEDIATE MEDICAL CARE IF:  You develop a rash.   You have difficulty breathing   You develop any reaction or side effects to medications given.   You develop lightheadedness or feel faint.   Your abdomen becomes distended (gets larger).   You develop nausea or vomiting or pass blood from the bowels.  Document Released: 12/29/2004 Document Revised: 05/31/2011 Document Reviewed: 07/11/2007 [facility] Patient Information 2012 [place], [place].

## 2011-11-24 DIAGNOSIS — C801 Malignant (primary) neoplasm, unspecified: Secondary | ICD-10-CM

## 2011-11-24 HISTORY — PX: PORTACATH PLACEMENT: SHX2246

## 2011-11-24 HISTORY — DX: Malignant (primary) neoplasm, unspecified: C80.1

## 2011-11-29 ENCOUNTER — Encounter (HOSPITAL_COMMUNITY): Payer: Self-pay | Admitting: Pharmacy Technician

## 2011-11-30 ENCOUNTER — Encounter (HOSPITAL_COMMUNITY): Payer: Self-pay

## 2011-11-30 ENCOUNTER — Encounter (HOSPITAL_COMMUNITY)
Admission: RE | Admit: 2011-11-30 | Discharge: 2011-11-30 | Disposition: A | Discharge status: Home / Self Care | Payer: BC Managed Care – PPO | Source: Ambulatory Visit | Attending: Surgery | Admitting: Surgery

## 2011-11-30 ENCOUNTER — Ambulatory Visit (HOSPITAL_COMMUNITY)
Admission: RE | Admit: 2011-11-30 | Discharge: 2011-11-30 | Disposition: A | Discharge status: Home / Self Care | Payer: BC Managed Care – PPO | Source: Ambulatory Visit | Attending: Surgery | Admitting: Surgery

## 2011-11-30 DIAGNOSIS — Z01812 Encounter for preprocedural laboratory examination: Secondary | ICD-10-CM | POA: Insufficient documentation

## 2011-11-30 DIAGNOSIS — C189 Malignant neoplasm of colon, unspecified: Secondary | ICD-10-CM | POA: Insufficient documentation

## 2011-11-30 DIAGNOSIS — Z01810 Encounter for preprocedural cardiovascular examination: Secondary | ICD-10-CM | POA: Insufficient documentation

## 2011-11-30 HISTORY — DX: Malignant (primary) neoplasm, unspecified: C80.1

## 2011-11-30 LAB — DIFFERENTIAL
Basophils Relative: 1 % (ref 0–1)
Eosinophils Absolute: 0.3 10*3/uL (ref 0.0–0.7)
Monocytes Relative: 7 % (ref 3–12)
Neutro Abs: 3 10*3/uL (ref 1.7–7.7)
Neutrophils Relative %: 43 % (ref 43–77)

## 2011-11-30 LAB — CBC
HCT: 40.4 % (ref 36.0–46.0)
Hemoglobin: 13.1 g/dL (ref 12.0–15.0)
MCHC: 32.4 g/dL (ref 30.0–36.0)
MCV: 91 fL (ref 78.0–100.0)
RDW: 13.1 % (ref 11.5–15.5)

## 2011-11-30 LAB — COMPREHENSIVE METABOLIC PANEL
Albumin: 3.8 g/dL (ref 3.5–5.2)
BUN: 10 mg/dL (ref 6–23)
Chloride: 105 mEq/L (ref 96–112)
Creatinine, Ser: 0.64 mg/dL (ref 0.50–1.10)
GFR calc Af Amer: >90 mL/min (ref >90)
Glucose, Bld: 100 mg/dL — ABNORMAL HIGH (ref 70–99)
Total Bilirubin: 0.2 mg/dL — ABNORMAL LOW (ref 0.3–1.2)
Total Protein: 7.2 g/dL (ref 6.0–8.3)

## 2011-11-30 LAB — SURGICAL PCR SCREEN: MRSA, PCR: NEGATIVE

## 2011-11-30 NOTE — Pre-Procedure Instructions (Signed)
Reviewed pre-op instructions with patient using Teach back method. 

## 2011-11-30 NOTE — Patient Instructions (Signed)
20 [name]  11/30/2011   Your procedure is scheduled on:  Thursday 12/06/2011 at 100pm  Report to [facility] at 1030 AM.  Call this number if you have problems the morning of surgery: [phone]   Remember:FOLLOW ONE DAY BOWEL PREP FROM DR.CORNETT'S OFFICE ON WED. 12/05/2011 -TAKE ANTIBIOTICS AS PRESCRIBED AND FOLLOW CLEAR LIQUID DIET ON WED. 12/05/2011   Do not eat food:AFTER Tuesday 12/04/2011-CLEAR LIQUID DIET ON WED. 12/05/2011  May have clear liquids:up to 6 Hours before arrival-FROM MIDNIGHT TIL 0700AM -AFTER 0700AM NOTHING  Clear liquids include soda, tea, black coffee, apple or grape juice, broth.  Take these medicines the morning of surgery with A SIP OF WATER: NONE   Do not wear jewelry, make-up or nail polish.  Do not wear lotions, powders, or perfumes. You may wear deodorant.  Do not shave 48 hours prior to surgery. Men may shave face and neck.  Do not bring valuables to the hospital.  Contacts, dentures or bridgework may not be worn into surgery.  Leave suitcase in the car. After surgery it may be brought to your room.  For patients admitted to the hospital, checkout time is 11:00 AM the day of discharge.       Special Instructions: CHG Shower Use Special Wash: 1/2 bottle night before surgery and 1/2 bottle morning of surgery.   Please read over the following fact sheets that you were given: MRSA Information, Blood transfusion sheet, Incentive Spirometry sheet, Sleep Apnea sheet

## 2011-12-06 ENCOUNTER — Encounter (HOSPITAL_COMMUNITY): Payer: Self-pay | Admitting: *Deleted

## 2011-12-06 ENCOUNTER — Encounter (HOSPITAL_COMMUNITY)
Admission: RE | Disposition: A | Discharge status: Home / Self Care | Payer: Self-pay | Source: Ambulatory Visit | Attending: Surgery

## 2011-12-06 ENCOUNTER — Ambulatory Visit (HOSPITAL_COMMUNITY)
Admission: RE | Admit: 2011-12-06 | Discharge: 2011-12-06 | Disposition: A | Discharge status: Home / Self Care | Payer: BC Managed Care – PPO | Source: Ambulatory Visit | Attending: Surgery | Admitting: Surgery

## 2011-12-06 DIAGNOSIS — Z01812 Encounter for preprocedural laboratory examination: Secondary | ICD-10-CM | POA: Insufficient documentation

## 2011-12-06 DIAGNOSIS — Z538 Procedure and treatment not carried out for other reasons: Secondary | ICD-10-CM | POA: Insufficient documentation

## 2011-12-06 DIAGNOSIS — C189 Malignant neoplasm of colon, unspecified: Secondary | ICD-10-CM | POA: Insufficient documentation

## 2011-12-06 LAB — TYPE AND SCREEN
ABO/RH(D): A POS
Antibody Screen: NEGATIVE

## 2011-12-06 SURGERY — LAPAROSCOPIC PARTIAL COLECTOMY
Anesthesia: General

## 2011-12-06 MED ORDER — SODIUM CHLORIDE 0.9 % IV SOLN
1.0000 g | INTRAVENOUS | Status: DC
  Filled 2011-12-06: qty 1

## 2011-12-06 MED ORDER — ALVIMOPAN 12 MG PO CAPS
12.0000 mg | ORAL_CAPSULE | Freq: Once | ORAL | Status: AC
  Administered 2011-12-06: 12 mg via ORAL

## 2011-12-06 MED ORDER — ALVIMOPAN 12 MG PO CAPS
ORAL_CAPSULE | ORAL | Status: AC
  Filled 2011-12-06: qty 1

## 2011-12-06 SURGICAL SUPPLY — 66 items
ADH SKN CLS APL DERMABOND .7 (GAUZE/BANDAGES/DRESSINGS)
APPLIER CLIP 5 13 M/L LIGAMAX5 (MISCELLANEOUS)
APR CLP MED LRG 5 ANG JAW (MISCELLANEOUS)
BLADE EXTENDED COATED 6.5IN (ELECTRODE) ×1 IMPLANT
BLADE HEX COATED 2.75 (ELECTRODE) ×1 IMPLANT
BLADE SURG SZ10 CARB STEEL (BLADE) ×2 IMPLANT
CABLE HIGH FREQUENCY MONO STRZ (ELECTRODE) ×1 IMPLANT
CANISTER SUCTION 2500CC (MISCELLANEOUS) ×1 IMPLANT
CANNULA ENDOPATH XCEL 11M (ENDOMECHANICALS) IMPLANT
CELLS DAT CNTRL 66122 CELL SVR (MISCELLANEOUS) IMPLANT
CLIP APPLIE 5 13 M/L LIGAMAX5 (MISCELLANEOUS) IMPLANT
CLOTH BEACON ORANGE TIMEOUT ST (SAFETY) ×1 IMPLANT
COVER MAYO STAND STRL (DRAPES) ×1 IMPLANT
DECANTER SPIKE VIAL GLASS SM (MISCELLANEOUS) ×1 IMPLANT
DERMABOND ADVANCED (GAUZE/BANDAGES/DRESSINGS)
DERMABOND ADVANCED .7 DNX12 (GAUZE/BANDAGES/DRESSINGS) IMPLANT
DRAPE LAPAROSCOPIC ABDOMINAL (DRAPES) ×1 IMPLANT
DRAPE LG THREE QUARTER DISP (DRAPES) ×1 IMPLANT
DRAPE WARM FLUID 44X44 (DRAPE) ×2 IMPLANT
ELECT REM PT RETURN 9FT ADLT (ELECTROSURGICAL)
ELECTRODE REM PT RTRN 9FT ADLT (ELECTROSURGICAL) ×1 IMPLANT
ENSEAL DEVICE STD TIP 35CM (ENDOMECHANICALS) IMPLANT
FILTER SMOKE EVAC LAPAROSHD (FILTER) ×1 IMPLANT
GLOVE BIOGEL PI IND STRL 7.0 (GLOVE) ×1 IMPLANT
GLOVE BIOGEL PI INDICATOR 7.0 (GLOVE)
GLOVE INDICATOR 8.0 STRL GRN (GLOVE) ×2 IMPLANT
GLOVE SS BIOGEL STRL SZ 8 (GLOVE) ×1 IMPLANT
GLOVE SUPERSENSE BIOGEL SZ 8 (GLOVE)
GOWN STRL NON-REIN LRG LVL3 (GOWN DISPOSABLE) ×1 IMPLANT
GOWN STRL REIN XL XLG (GOWN DISPOSABLE) ×2 IMPLANT
HAND ACTIVATED (MISCELLANEOUS) ×1 IMPLANT
KIT BASIN OR (CUSTOM PROCEDURE TRAY) ×1 IMPLANT
LEGGING LITHOTOMY PAIR STRL (DRAPES) ×1 IMPLANT
LIGASURE IMPACT 36 18CM CVD LR (INSTRUMENTS) ×1 IMPLANT
NS IRRIG 1000ML POUR BTL (IV SOLUTION) ×2 IMPLANT
PENCIL BUTTON HOLSTER BLD 10FT (ELECTRODE) ×1 IMPLANT
RETRACTOR WND ALEXIS 18 MED (MISCELLANEOUS) ×1 IMPLANT
RTRCTR WOUND ALEXIS 18CM MED (MISCELLANEOUS)
SCISSORS LAP 5X35 DISP (ENDOMECHANICALS) ×1 IMPLANT
SET IRRIG TUBING LAPAROSCOPIC (IRRIGATION / IRRIGATOR) ×1 IMPLANT
SPONGE GAUZE 4X4 12PLY (GAUZE/BANDAGES/DRESSINGS) ×1 IMPLANT
SPONGE LAP 18X18 X RAY DECT (DISPOSABLE) ×2 IMPLANT
STAPLER VISISTAT 35W (STAPLE) ×1 IMPLANT
SUCTION POOLE TIP (SUCTIONS) ×1 IMPLANT
SUT PDS AB 1 CTX 36 (SUTURE) ×2 IMPLANT
SUT PROLENE 2 0 SH DA (SUTURE) IMPLANT
SUT SILK 2 0 (SUTURE)
SUT SILK 2 0 SH CR/8 (SUTURE) ×1 IMPLANT
SUT SILK 2-0 18XBRD TIE 12 (SUTURE) ×1 IMPLANT
SUT SILK 3 0 (SUTURE)
SUT SILK 3 0 SH CR/8 (SUTURE) ×1 IMPLANT
SUT SILK 3-0 18XBRD TIE 12 (SUTURE) ×1 IMPLANT
SUT VIC AB 3-0 SH 18 (SUTURE) IMPLANT
SUT VIC AB 3-0 SH 8-18 (SUTURE) IMPLANT
SUT VICRYL 2 0 18  UND BR (SUTURE)
SUT VICRYL 2 0 18 UND BR (SUTURE) IMPLANT
SYR BULB IRRIGATION 50ML (SYRINGE) ×1 IMPLANT
TOWEL OR 17X26 10 PK STRL BLUE (TOWEL DISPOSABLE) ×1 IMPLANT
TRAY FOLEY CATH 14FRSI W/METER (CATHETERS) ×1 IMPLANT
TRAY LAP CHOLE (CUSTOM PROCEDURE TRAY) ×1 IMPLANT
TROCAR BLADELESS OPT 5 75 (ENDOMECHANICALS) ×2 IMPLANT
TROCAR XCEL BLUNT TIP 100MML (ENDOMECHANICALS) ×1 IMPLANT
TROCAR XCEL NON-BLD 11X100MML (ENDOMECHANICALS) IMPLANT
TUBING INSUFFLATION 10FT LAP (TUBING) ×1 IMPLANT
YANKAUER SUCT BULB TIP 10FT TU (MISCELLANEOUS) ×1 IMPLANT
YANKAUER SUCT BULB TIP NO VENT (SUCTIONS) ×1 IMPLANT

## 2011-12-06 NOTE — Progress Notes (Signed)
States compliant with bowel prep as directed by doctor. 

## 2011-12-06 NOTE — Progress Notes (Signed)
Dr. Luisa Hart called regarding patient ate Pancakes and bacon at 12 noon on 12/05/2011. States she was instructed to take antibiotics and clear liquids. No laxative. Had a formed brown BM this am at around 8am. Spoke with Dr. Luisa Hart who states she was instructed to do Miralax and Surgery to be canceled.

## 2011-12-06 NOTE — Progress Notes (Signed)
Prior to calling Dr. Mignon Pine. Patient told me she was instructed to take antibiotics Was not prescribed a Laxative because Dr. Luisa Hart did not want her to get dehydrated. Dr. Mignon Pine in formed of this statement. Phone given to patient for doctor to speak with patient.

## 2011-12-07 ENCOUNTER — Encounter (INDEPENDENT_AMBULATORY_CARE_PROVIDER_SITE_OTHER): Payer: Self-pay

## 2011-12-07 ENCOUNTER — Encounter (HOSPITAL_COMMUNITY): Payer: Self-pay | Admitting: Pharmacy Technician

## 2011-12-18 ENCOUNTER — Encounter (HOSPITAL_COMMUNITY): Payer: Self-pay | Admitting: Anesthesiology

## 2011-12-18 ENCOUNTER — Encounter (HOSPITAL_COMMUNITY): Payer: Self-pay | Admitting: Surgery

## 2011-12-18 ENCOUNTER — Ambulatory Visit (HOSPITAL_COMMUNITY): Payer: BC Managed Care – PPO | Admitting: Anesthesiology

## 2011-12-18 ENCOUNTER — Encounter (HOSPITAL_COMMUNITY)
Admission: RE | Disposition: A | Discharge status: Home / Self Care | Payer: Self-pay | Source: Ambulatory Visit | Attending: Surgery

## 2011-12-18 ENCOUNTER — Inpatient Hospital Stay (HOSPITAL_COMMUNITY)
Admission: RE | Admit: 2011-12-18 | Discharge: 2011-12-24 | DRG: 149 | Disposition: A | Discharge status: Home / Self Care | Payer: BC Managed Care – PPO | Source: Ambulatory Visit | Attending: Surgery | Admitting: Surgery

## 2011-12-18 DIAGNOSIS — Z888 Allergy status to other drugs, medicaments and biological substances status: Secondary | ICD-10-CM

## 2011-12-18 DIAGNOSIS — C189 Malignant neoplasm of colon, unspecified: Secondary | ICD-10-CM | POA: Insufficient documentation

## 2011-12-18 DIAGNOSIS — I10 Essential (primary) hypertension: Secondary | ICD-10-CM | POA: Diagnosis present

## 2011-12-18 DIAGNOSIS — Z87442 Personal history of urinary calculi: Secondary | ICD-10-CM

## 2011-12-18 DIAGNOSIS — C184 Malignant neoplasm of transverse colon: Principal | ICD-10-CM | POA: Diagnosis present

## 2011-12-18 DIAGNOSIS — Z9889 Other specified postprocedural states: Secondary | ICD-10-CM

## 2011-12-18 DIAGNOSIS — Z886 Allergy status to analgesic agent status: Secondary | ICD-10-CM

## 2011-12-18 DIAGNOSIS — F17200 Nicotine dependence, unspecified, uncomplicated: Secondary | ICD-10-CM | POA: Diagnosis present

## 2011-12-18 DIAGNOSIS — Z9089 Acquired absence of other organs: Secondary | ICD-10-CM

## 2011-12-18 DIAGNOSIS — Z880 Allergy status to penicillin: Secondary | ICD-10-CM

## 2011-12-18 DIAGNOSIS — C19 Malignant neoplasm of rectosigmoid junction: Secondary | ICD-10-CM

## 2011-12-18 HISTORY — DX: Malignant neoplasm of colon, unspecified: C18.9

## 2011-12-18 HISTORY — PX: COLON RESECTION: SHX5231

## 2011-12-18 LAB — CBC
HCT: 37.5 % (ref 36.0–46.0)
Hemoglobin: 12.6 g/dL (ref 12.0–15.0)
RBC: 4.22 MIL/uL (ref 3.87–5.11)
WBC: 12.1 10*3/uL — ABNORMAL HIGH (ref 4.0–10.5)

## 2011-12-18 LAB — CREATININE, SERUM
GFR calc Af Amer: >90 mL/min (ref >90)
GFR calc non Af Amer: >90 mL/min (ref >90)

## 2011-12-18 SURGERY — COLON RESECTION LAPAROSCOPIC
Anesthesia: General | Site: Abdomen | Wound class: Clean Contaminated

## 2011-12-18 MED ORDER — 0.9 % SODIUM CHLORIDE (POUR BTL) OPTIME
TOPICAL | Status: DC | PRN
  Administered 2011-12-18 (×2): 1000 mL

## 2011-12-18 MED ORDER — NEOSTIGMINE METHYLSULFATE 1 MG/ML IJ SOLN
INTRAMUSCULAR | Status: DC | PRN
  Administered 2011-12-18: 3 mg via INTRAVENOUS

## 2011-12-18 MED ORDER — ONDANSETRON HCL 4 MG/2ML IJ SOLN: 4.0000 mg | Freq: Four times a day (QID) | INTRAMUSCULAR | Status: DC | PRN

## 2011-12-18 MED ORDER — GLYCOPYRROLATE 0.2 MG/ML IJ SOLN
INTRAMUSCULAR | Status: DC | PRN
  Administered 2011-12-18: .4 mg via INTRAVENOUS

## 2011-12-18 MED ORDER — KCL IN DEXTROSE-NACL 20-5-0.45 MEQ/L-%-% IV SOLN
INTRAVENOUS | Status: DC
  Administered 2011-12-18: 1000 mL via INTRAVENOUS
  Administered 2011-12-19 – 2011-12-22 (×8): via INTRAVENOUS
  Filled 2011-12-18 (×15): qty 1000

## 2011-12-18 MED ORDER — SODIUM CHLORIDE 0.9 % IV SOLN
1.0000 g | INTRAVENOUS | Status: DC
  Filled 2011-12-18: qty 1

## 2011-12-18 MED ORDER — DIPHENHYDRAMINE HCL 50 MG/ML IJ SOLN: 12.5000 mg | Freq: Four times a day (QID) | INTRAMUSCULAR | Status: DC | PRN

## 2011-12-18 MED ORDER — ONDANSETRON HCL 4 MG/2ML IJ SOLN
4.0000 mg | Freq: Four times a day (QID) | INTRAMUSCULAR | Status: DC | PRN
  Filled 2011-12-18: qty 2

## 2011-12-18 MED ORDER — HYDROMORPHONE 0.3 MG/ML IV SOLN
INTRAVENOUS | Status: DC
  Administered 2011-12-18: 1.33 mL via INTRAVENOUS
  Administered 2011-12-18: 0.59 mg via INTRAVENOUS
  Administered 2011-12-18: 10:00:00 via INTRAVENOUS
  Administered 2011-12-19: 0.2 mg via INTRAVENOUS
  Administered 2011-12-19: 0.199 mg via INTRAVENOUS
  Administered 2011-12-19: 0.4 mg via INTRAVENOUS
  Administered 2011-12-19: 0.999 mg via INTRAVENOUS
  Administered 2011-12-19: 0.67 mL via INTRAVENOUS
  Administered 2011-12-20 (×3): 0.2 mg via INTRAVENOUS
  Administered 2011-12-20: 0.199 mg via INTRAVENOUS
  Administered 2011-12-20: 0.2 mg via INTRAVENOUS
  Administered 2011-12-21: 0.4 mg via INTRAVENOUS
  Administered 2011-12-21: 0.6 mg via INTRAVENOUS

## 2011-12-18 MED ORDER — FENTANYL CITRATE 0.05 MG/ML IJ SOLN
INTRAMUSCULAR | Status: DC | PRN
  Administered 2011-12-18: 50 ug via INTRAVENOUS
  Administered 2011-12-18: 100 ug via INTRAVENOUS
  Administered 2011-12-18 (×2): 50 ug via INTRAVENOUS

## 2011-12-18 MED ORDER — EPHEDRINE SULFATE 50 MG/ML IJ SOLN
INTRAMUSCULAR | Status: DC | PRN
  Administered 2011-12-18: 10 mg via INTRAVENOUS
  Administered 2011-12-18: 5 mg via INTRAVENOUS

## 2011-12-18 MED ORDER — SODIUM CHLORIDE 0.9 % IJ SOLN: 9.0000 mL | INTRAMUSCULAR | Status: DC | PRN

## 2011-12-18 MED ORDER — NALOXONE HCL 0.4 MG/ML IJ SOLN: 0.4000 mg | INTRAMUSCULAR | Status: DC | PRN

## 2011-12-18 MED ORDER — ONDANSETRON HCL 4 MG/2ML IJ SOLN
INTRAMUSCULAR | Status: DC | PRN
  Administered 2011-12-18: 4 mg via INTRAVENOUS

## 2011-12-18 MED ORDER — LIDOCAINE HCL (CARDIAC) 20 MG/ML IV SOLN
INTRAVENOUS | Status: DC | PRN
  Administered 2011-12-18: 80 mg via INTRAVENOUS

## 2011-12-18 MED ORDER — ONDANSETRON HCL 4 MG/2ML IJ SOLN
4.0000 mg | Freq: Four times a day (QID) | INTRAMUSCULAR | Status: DC | PRN
  Administered 2011-12-19: 4 mg via INTRAVENOUS

## 2011-12-18 MED ORDER — HYDROMORPHONE 0.3 MG/ML IV SOLN
INTRAVENOUS | Status: AC
  Filled 2011-12-18: qty 25

## 2011-12-18 MED ORDER — BUPIVACAINE-EPINEPHRINE (PF) 0.25% -1:200000 IJ SOLN
INTRAMUSCULAR | Status: DC | PRN
  Administered 2011-12-18: 12 mL

## 2011-12-18 MED ORDER — SODIUM CHLORIDE 0.9 % IV SOLN
1.0000 g | INTRAVENOUS | Status: DC | PRN
  Administered 2011-12-18: 1 g via INTRAVENOUS

## 2011-12-18 MED ORDER — OXYCODONE-ACETAMINOPHEN 5-325 MG PO TABS
1.0000 | ORAL_TABLET | ORAL | Status: DC | PRN
  Administered 2011-12-19: 1 via ORAL
  Administered 2011-12-21: 2 via ORAL
  Administered 2011-12-22 – 2011-12-23 (×2): 1 via ORAL
  Filled 2011-12-18: qty 1
  Filled 2011-12-18: qty 2
  Filled 2011-12-18 (×2): qty 1

## 2011-12-18 MED ORDER — ONDANSETRON HCL 4 MG PO TABS: 4.0000 mg | ORAL_TABLET | Freq: Four times a day (QID) | ORAL | Status: DC | PRN

## 2011-12-18 MED ORDER — DIPHENHYDRAMINE HCL 12.5 MG/5ML PO ELIX
12.5000 mg | ORAL_SOLUTION | Freq: Four times a day (QID) | ORAL | Status: DC | PRN
  Filled 2011-12-18: qty 5

## 2011-12-18 MED ORDER — SODIUM CHLORIDE 0.9 % IR SOLN
Status: DC | PRN
  Administered 2011-12-18: 1000 mL

## 2011-12-18 MED ORDER — HYDROMORPHONE HCL PF 1 MG/ML IJ SOLN: 0.2500 mg | INTRAMUSCULAR | Status: DC | PRN

## 2011-12-18 MED ORDER — LACTATED RINGERS IV SOLN
INTRAVENOUS | Status: DC | PRN
  Administered 2011-12-18 (×3): via INTRAVENOUS

## 2011-12-18 MED ORDER — BUPIVACAINE-EPINEPHRINE PF 0.25-1:200000 % IJ SOLN
INTRAMUSCULAR | Status: AC
  Filled 2011-12-18: qty 30

## 2011-12-18 MED ORDER — MIDAZOLAM HCL 5 MG/5ML IJ SOLN
INTRAMUSCULAR | Status: DC | PRN
  Administered 2011-12-18: 2 mg via INTRAVENOUS

## 2011-12-18 MED ORDER — PROPOFOL 10 MG/ML IV EMUL
INTRAVENOUS | Status: DC | PRN
  Administered 2011-12-18: 180 mg via INTRAVENOUS

## 2011-12-18 MED ORDER — ROCURONIUM BROMIDE 100 MG/10ML IV SOLN
INTRAVENOUS | Status: DC | PRN
  Administered 2011-12-18: 50 mg via INTRAVENOUS

## 2011-12-18 MED ORDER — ENOXAPARIN SODIUM 30 MG/0.3ML ~~LOC~~ SOLN
30.0000 mg | SUBCUTANEOUS | Status: DC
  Administered 2011-12-19 – 2011-12-23 (×5): 30 mg via SUBCUTANEOUS
  Filled 2011-12-18 (×7): qty 0.3

## 2011-12-18 SURGICAL SUPPLY — 81 items
ADH SKN CLS LQ APL DERMABOND (GAUZE/BANDAGES/DRESSINGS) ×1
APPLIER CLIP ROT 10 11.4 M/L (STAPLE)
APR CLP MED LRG 11.4X10 (STAPLE)
BLADE SURG 10 STRL SS (BLADE) ×2 IMPLANT
BLADE SURG ROTATE 9660 (MISCELLANEOUS) ×1 IMPLANT
CANISTER SUCTION 2500CC (MISCELLANEOUS) ×2 IMPLANT
CELLS DAT CNTRL 66122 CELL SVR (MISCELLANEOUS) IMPLANT
CHLORAPREP W/TINT 26ML (MISCELLANEOUS) ×2 IMPLANT
CLIP APPLIE ROT 10 11.4 M/L (STAPLE) IMPLANT
CLOTH BEACON ORANGE TIMEOUT ST (SAFETY) ×2 IMPLANT
COVER SURGICAL LIGHT HANDLE (MISCELLANEOUS) ×3 IMPLANT
DECANTER SPIKE VIAL GLASS SM (MISCELLANEOUS) ×2 IMPLANT
DERMABOND ADHESIVE PROPEN (GAUZE/BANDAGES/DRESSINGS) ×1
DERMABOND ADVANCED .7 DNX6 (GAUZE/BANDAGES/DRESSINGS) IMPLANT
DRAPE PROXIMA HALF (DRAPES) IMPLANT
DRAPE UTILITY 15X26 W/TAPE STR (DRAPE) ×4 IMPLANT
DRAPE WARM FLUID 44X44 (DRAPE) ×3 IMPLANT
ELECT CAUTERY BLADE 6.4 (BLADE) ×3 IMPLANT
ELECT REM PT RETURN 9FT ADLT (ELECTROSURGICAL) ×2
ELECTRODE REM PT RTRN 9FT ADLT (ELECTROSURGICAL) ×1 IMPLANT
GEL ULTRASOUND 20GR AQUASONIC (MISCELLANEOUS) IMPLANT
GLOVE BIO SURGEON STRL SZ7.5 (GLOVE) ×3 IMPLANT
GLOVE BIO SURGEON STRL SZ8 (GLOVE) ×4 IMPLANT
GLOVE BIOGEL PI IND STRL 7.0 (GLOVE) IMPLANT
GLOVE BIOGEL PI IND STRL 7.5 (GLOVE) IMPLANT
GLOVE BIOGEL PI IND STRL 8 (GLOVE) ×1 IMPLANT
GLOVE BIOGEL PI INDICATOR 7.0 (GLOVE) ×3
GLOVE BIOGEL PI INDICATOR 7.5 (GLOVE) ×2
GLOVE BIOGEL PI INDICATOR 8 (GLOVE) ×1
GLOVE ECLIPSE 6.5 STRL STRAW (GLOVE) ×1 IMPLANT
GLOVE SURG SIGNA 7.5 PF LTX (GLOVE) ×1 IMPLANT
GOWN STRL NON-REIN LRG LVL3 (GOWN DISPOSABLE) ×9 IMPLANT
KIT BASIN OR (CUSTOM PROCEDURE TRAY) ×2 IMPLANT
KIT ROOM TURNOVER OR (KITS) ×2 IMPLANT
LEGGING LITHOTOMY PAIR STRL (DRAPES) IMPLANT
LIGASURE IMPACT 36 18CM CVD LR (INSTRUMENTS) ×2 IMPLANT
NS IRRIG 1000ML POUR BTL (IV SOLUTION) ×4 IMPLANT
PAD ARMBOARD 7.5X6 YLW CONV (MISCELLANEOUS) ×4 IMPLANT
PENCIL BUTTON HOLSTER BLD 10FT (ELECTRODE) ×2 IMPLANT
RELOAD PROXIMATE 75MM BLUE (ENDOMECHANICALS) ×2 IMPLANT
RELOAD STAPLE 75 3.8 BLU REG (ENDOMECHANICALS) IMPLANT
RETRACTOR WND ALEXIS 18 MED (MISCELLANEOUS) IMPLANT
RTRCTR WOUND ALEXIS 18CM MED (MISCELLANEOUS)
SCALPEL HARMONIC ACE (MISCELLANEOUS) ×1 IMPLANT
SCISSORS LAP 5X35 DISP (ENDOMECHANICALS) IMPLANT
SET IRRIG TUBING LAPAROSCOPIC (IRRIGATION / IRRIGATOR) ×2 IMPLANT
SLEEVE ENDOPATH XCEL 5M (ENDOMECHANICALS) ×4 IMPLANT
SPECIMEN JAR LARGE (MISCELLANEOUS) ×1 IMPLANT
SPONGE GAUZE 4X4 12PLY (GAUZE/BANDAGES/DRESSINGS) ×2 IMPLANT
STAPLER GUN LINEAR PROX 60 (STAPLE) ×1 IMPLANT
STAPLER PROXIMATE 75MM BLUE (STAPLE) ×2 IMPLANT
STAPLER VISISTAT 35W (STAPLE) ×1 IMPLANT
SURGILUBE 2OZ TUBE FLIPTOP (MISCELLANEOUS) IMPLANT
SUT PDS AB 1 CT  36 (SUTURE) ×2
SUT PDS AB 1 CT 36 (SUTURE) IMPLANT
SUT PROLENE 2 0 CT2 30 (SUTURE) IMPLANT
SUT PROLENE 2 0 KS (SUTURE) IMPLANT
SUT SILK 2 0 (SUTURE) ×2
SUT SILK 2-0 18XBRD TIE 12 (SUTURE) ×1 IMPLANT
SUT SILK 3 0 (SUTURE) ×2
SUT SILK 3-0 18XBRD TIE 12 (SUTURE) ×1 IMPLANT
SUT VIC AB 3-0 54X BRD REEL (SUTURE) IMPLANT
SUT VIC AB 3-0 BRD 54 (SUTURE)
SUT VIC AB 3-0 SH 18 (SUTURE) ×2 IMPLANT
SUT VICRYL AB 2 0 TIES (SUTURE) ×2 IMPLANT
SYS LAPSCP GELPORT 120MM (MISCELLANEOUS) ×2
SYSTEM LAPSCP GELPORT 120MM (MISCELLANEOUS) IMPLANT
TAPE CLOTH SURG 4X10 WHT LF (GAUZE/BANDAGES/DRESSINGS) ×1 IMPLANT
TOWEL OR 17X24 6PK STRL BLUE (TOWEL DISPOSABLE) ×2 IMPLANT
TOWEL OR 17X26 10 PK STRL BLUE (TOWEL DISPOSABLE) ×2 IMPLANT
TOWEL OR NON WOVEN STRL DISP B (DISPOSABLE) ×2 IMPLANT
TRAY FOLEY CATH 14FRSI W/METER (CATHETERS) ×2 IMPLANT
TRAY LAPAROSCOPIC (CUSTOM PROCEDURE TRAY) ×2 IMPLANT
TRAY PROCTOSCOPIC FIBER OPTIC (SET/KITS/TRAYS/PACK) IMPLANT
TROCAR XCEL BLUNT TIP 100MML (ENDOMECHANICALS) IMPLANT
TROCAR XCEL NON-BLD 11X100MML (ENDOMECHANICALS) ×2 IMPLANT
TROCAR XCEL NON-BLD 5MMX100MML (ENDOMECHANICALS) ×2 IMPLANT
TUBE CONNECTING 12X1/4 (SUCTIONS) ×2 IMPLANT
TUBING FILTER THERMOFLATOR (ELECTROSURGICAL) ×2 IMPLANT
WATER STERILE IRR 1000ML POUR (IV SOLUTION) IMPLANT
YANKAUER SUCT BULB TIP NO VENT (SUCTIONS) ×3 IMPLANT

## 2011-12-18 NOTE — Progress Notes (Signed)
UR complete 

## 2011-12-18 NOTE — Interval H&P Note (Signed)
History and Physical Interval Note:  12/18/2011 7:10 AM  [name]  has presented today for surgery, with the diagnosis of colon cancer  The various methods of treatment have been discussed with the patient and family. After consideration of risks, benefits and other options for treatment, the patient has consented to  Procedure(s) (LRB): COLON RESECTION LAPAROSCOPIC (N/A) as a surgical intervention .  The patient's history has been reviewed, patient examined, no change in status, stable for surgery.  I have reviewed the patients' chart and labs.  Questions were answered to the patient's satisfaction.     Finbar Nippert A.

## 2011-12-18 NOTE — H&P (Signed)
[name]    MRN: [id number]   Description: 66 year old female  Provider: Dortha Schwalbe., MD  Department: [facility]        Diagnoses     Colon cancer   - Primary    153.9         Vitals - Last Recorded       BP Pulse Temp Ht Wt BMI    140/80 72 97.2 F (36.2 C) 5\' 5"  (1.651 m) 164 lb 12.8 oz (74.753 kg) 27.42 kg/m2       Progress Notes    Patient ID: [name], female   DOB: 1946-02-07, 66 y.o.   MRN: [id number]   No chief complaint on file.   HPI [name] is a 66 y.o. female.   HPI Patient sent at the request of Dr. Christella Hartigan due to a colon mass in the distal transverse colon. She's having abdominal pain about a month ago and was seen in the emergency room. She was found to have a lesion involving her distal transverse colon. Colonoscopy is performed for which showed a circumferential 4 cm mass core biopsy proven to be invasive carcinoma. CT was otherwise normal. Patient denies any recent weight loss, blood or stool or loss of appetite. She feels well today. Past Medical History   Diagnosis  Date   .  Kidney stone     .  Abdominal pain         Past Surgical History   Procedure  Date   .  Cholecystectomy     .  Goiter removal      .  Kidney stone surgery         Family History   Problem  Relation  Age of Onset   .  Breast cancer  Mother     .  Heart disease  Mother     .  Hypertension  Other        Social History History   Substance Use Topics   .  Smoking status:  Current Everyday Smoker -- 0.2 packs/day       Types:  Cigarettes   .  Smokeless tobacco:  Never Used   .  Alcohol Use:  No       Allergies   Allergen  Reactions   .  Aspirin  Nausea And Vomiting   .  Advil (Ibuprofen)  Palpitations   .  Penicillins  Rash       No current outpatient prescriptions on file.       Current Facility-Administered Medications   Medication  Dose  Route  Frequency  Provider  Last Rate  Last Dose   .  0.9 %  sodium chloride  infusion   500 mL  Intravenous  Continuous  Rachael Fee, MD            Review of Systems Review of Systems  Constitutional: Negative for fever, chills and unexpected weight change.  HENT: Negative for hearing loss, congestion, sore throat, trouble swallowing and voice change.   Eyes: Negative for visual disturbance.  Respiratory: Negative for cough and wheezing.   Cardiovascular: Negative for chest pain, palpitations and leg swelling.  Gastrointestinal: Negative for nausea, vomiting, abdominal pain, diarrhea, constipation, blood in stool, abdominal distention and anal bleeding.  Genitourinary: Negative for hematuria, vaginal bleeding and difficulty urinating.  Musculoskeletal: Negative for arthralgias.  Skin: Negative for rash and wound.  Neurological: Negative for seizures, syncope and headaches.  Hematological: Negative  for adenopathy. Does not bruise/bleed easily.  Psychiatric/Behavioral: Negative for confusion.    Blood pressure 140/80, pulse 72, temperature 97.2 F (36.2 C), height 5\' 5"  (1.651 m), weight 164 lb 12.8 oz (74.753 kg).   Physical Exam Physical Exam  Constitutional: She is oriented to person, place, and time. She appears well-developed and well-nourished.  HENT:   Head: Normocephalic and atraumatic.  Eyes: EOM are normal. Pupils are equal, round, and reactive to light.  Neck: Normal range of motion. Neck supple.  Cardiovascular: Normal rate and regular rhythm.   Pulmonary/Chest: Effort normal and breath sounds normal.  Abdominal: Soft. Bowel sounds are normal. She exhibits no distension. There is no tenderness.  Musculoskeletal: Normal range of motion.  Neurological: She is alert and oriented to person, place, and time.  Skin: Skin is warm and dry.    Data Reviewed Colonoscopy   Lesion at distal transverse colon biopsy proven adenocarcinoma it was tattooed.  CT lesion distal  transverse colon no evidence of metastasis     Assessment Colon cancer  distal transverse colon   Plan Laparoscopic partial colectomy.The procedure was discussed with the patient.  Laparoscopic partial colectomy discussed with the patient as well as non operative treatments. The risks of operative management include bleeding,  Infection,  Leak of anastamosis,  Ostomy formation, open procedure,  Sepsis,  Abcess,  Hernia,  DVT,  Pulmonary complications,  Cardiovascular  complications,  Injury to ureter,  Bladder,kidney,and anesthesia risks,  And death. The patient understands.  Questions answered.   The success of the procedure is 50-100% for treating the patients symptoms. They agree to proceed.       [name] 12/18/2011

## 2011-12-18 NOTE — Progress Notes (Signed)
Dr. Luisa Hart paged and notified of expired type and screen and lab work and Nurse asked Dr. Luisa Hart if he wanted to order more lab work. Dr. Luisa Hart stated " I don't need any lab work, I don't even need a CBC. She's good to go." Will cancel CBC in EPIC and update patient of this.

## 2011-12-18 NOTE — Anesthesia Preprocedure Evaluation (Addendum)
Anesthesia Evaluation  Patient identified by MRN, date of birth, ID band Patient awake    Reviewed: Allergy & Precautions, H&P , NPO status , Patient's Chart, lab work & pertinent test results  History of Anesthesia Complications Negative for: history of anesthetic complications  Airway Mallampati: II TM Distance: <3 FB Neck ROM: full    Dental  (+) Teeth Intact, Partial Upper and Dental Advisory Given   Pulmonary Current Smoker,          Cardiovascular negative cardio ROS  - Valvular Problems/Murmurs    Neuro/Psych negative neurological ROS  negative psych ROS   GI/Hepatic negative GI ROS, Neg liver ROS,   Endo/Other  negative endocrine ROS  Renal/GU negative Renal ROS     Musculoskeletal negative musculoskeletal ROS (+)   Abdominal   Peds negative pediatric ROS (+)  Hematology negative hematology ROS (+)   Anesthesia Other Findings   Reproductive/Obstetrics negative OB ROS                         Anesthesia Physical Anesthesia Plan  ASA: II  Anesthesia Plan: General   Post-op Pain Management:    Induction: Intravenous  Airway Management Planned: Oral ETT  Additional Equipment:   Intra-op Plan:   Post-operative Plan: Extubation in OR  Informed Consent: I have reviewed the patients History and Physical, chart, labs and discussed the procedure including the risks, benefits and alternatives for the proposed anesthesia with the patient or authorized representative who has indicated his/her understanding and acceptance.     Plan Discussed with: CRNA and Surgeon  Anesthesia Plan Comments:         Anesthesia Quick Evaluation

## 2011-12-18 NOTE — H&P (Signed)
Attempted to complete Admission Hx.  Patient does not respond to any questions asked.  Briefly opens eyes w/tactile stimulation. Okey Regal, RN notified. Earnstine Meinders C 12/18/11 1800

## 2011-12-18 NOTE — Op Note (Signed)
Laparoscopic Partial Colectomy  WITH TAKEDOWN OF SPLENIC FLEXURE  Indications: This patient presents for a laparoscopic transverse hemicolectomy for a cancer  of the transverse colon. The patient underwent a complete mechanical and antibiotic bowel prep prior to his operation.  Pre-operative Diagnosis: Cancer of the transverse colon  Post-operative Diagnosis: same  Surgeon: Harriette Bouillon A.   Assistants: Dr Ezzard Standing MD  Anesthesia: General endotracheal anesthesia and Local anesthesia 0.25.% bupivacaine, with epinephrine  ASA Class: 3  Procedure Details  The patient was seen in the Holding Room. The risks, benefits, complications, treatment options, and expected outcomes were discussed with the patient. The possibilities of reaction to medication, pulmonary aspiration, perforation of viscus, bleeding, recurrent infection, finding a normal colon, the need for additional procedures, failure to diagnose a condition, and creating a complication requiring transfusion or operation were discussed with the patient. The patient concurred with the proposed plan, giving informed consent.   The patient was taken to Operating Room # 2, identified as [name] and the procedure verified as partial colectomy. A Time Out was held and the above information confirmed.  The patient was brought to the operating room and placed supine. After induction of a general anesthetic, a Foley catheter was inserted and the abdomen was prepped and draped in standard fashion. One-quarter  percent Marcaine with epinephrine was used to anesthetize the skin in the right lower quadrant.  5 mm optiview trocar introduced and advanced under dierct vision into the abdominal cavity without difficulty.  Pneumoperitoneum to  15 mm Hg pressure created.   The laparoscope was introduced.    Exploration revealed a normal omentum, colon, small bowel, peritoneum, liver, and stomach. A left lateral ,right lateral and epigastric  5-mm trocars   were then placed after anesthetizing the skin and peritoneum with Marcaine. The descending colon and splenic flexure were then mobilized with gentle retraction of the colon in a medial direction with mobilization of the peritoneal reflection with cautery and the harmonic scalpel. Mobilization of this area was complete to expose the retroperitoneum. The ureter was not identified during this mobilization process but no structures were divided during this mobilization. There was no blood loss during this portion of the procedure.      The mobilization continued to include the splenic flexure  with the harmonic scalpel in a bloodless field. After completing mobilization, and upper midline hand port was placed through a 7 cm incision.. The colon was resected with a linear stapling device proximal and distal to the area in question in regard to the specimen. The tumor was located in the distal transverse colon and was inked preop.  The mesenteric vessels were ligated with the Ligasure.. The specimen was submitted to pathology.      An end-to-end anastomosis was performed through the small anterior incision with the linear stapling device. The mucosa was inspected and found to be hemostatic. Closure was achieved with the linear stapling device. A 3-0 vicryl suture was used to reapproximate the angle of the anastomosis. Hemostasis was confirmed. The omentum was sutured over the staple anastamosis.  No tension was noted and the anastomosis appeared viable The bowel anastomosis was returned and the incision was then closed with a running #1 PDS suture. . The soft tissue was irrigated and the incisions were closed with staples. The laparoscope was reintroduced and the peritoneum was irrigated.  No bleeding or leakage noted and the anastomosis was under no tension and was not twisted.  The remainder of the ports were removed  and CO2 allowed to escape.  Port sites closed with staples.   Instrument, sponge, and needle  counts were correct prior to abdominal closure and at the conclusion of the case.   Findings: Tumor distal transverse colon.  Estimated Blood Loss: less than 100 mL         Drains: none         Total IV Fluids: 2100 mL         Specimens:  above          Complications: None; patient tolerated the procedure well.         Disposition: PACU - hemodynamically stable.         Condition: stable

## 2011-12-18 NOTE — Preoperative (Signed)
Beta Blockers   Reason not to administer Beta Blockers:Not Applicable 

## 2011-12-18 NOTE — Anesthesia Postprocedure Evaluation (Signed)
Anesthesia Post Note  Patient: [name]  Procedure(s) Performed: Procedure(s) (LRB): COLON RESECTION LAPAROSCOPIC (N/A)  Anesthesia type: General  Patient location: PACU  Post pain: Pain level controlled and Adequate analgesia  Post assessment: Post-op Vital signs reviewed, Patient's Cardiovascular Status Stable, Respiratory Function Stable, Patent Airway and Pain level controlled  Last Vitals:  Filed Vitals:   12/18/11 1015  BP:   Pulse:   Temp: 36.5 C  Resp:     Post vital signs: Reviewed and stable  Level of consciousness: awake, alert  and oriented  Complications: No apparent anesthesia complications

## 2011-12-18 NOTE — Transfer of Care (Signed)
Immediate Anesthesia Transfer of Care Note  Patient: [name]  Procedure(s) Performed: Procedure(s) (LRB): COLON RESECTION LAPAROSCOPIC (N/A)  Patient Location: PACU  Anesthesia Type: General  Level of Consciousness: awake, alert  and oriented  Airway & Oxygen Therapy: Patient Spontanous Breathing and Patient connected to nasal cannula oxygen  Post-op Assessment: Report given to PACU RN and Post -op Vital signs reviewed and stable  Post vital signs: Reviewed and stable  Complications: No apparent anesthesia complications

## 2011-12-19 LAB — CBC
HCT: 36.5 % (ref 36.0–46.0)
Hemoglobin: 12.1 g/dL (ref 12.0–15.0)
RDW: 12.9 % (ref 11.5–15.5)
WBC: 8.3 10*3/uL (ref 4.0–10.5)

## 2011-12-19 LAB — BASIC METABOLIC PANEL
Chloride: 106 mEq/L (ref 96–112)
GFR calc Af Amer: >90 mL/min (ref >90)
GFR calc non Af Amer: >90 mL/min (ref >90)
Potassium: 3.6 mEq/L (ref 3.5–5.1)

## 2011-12-19 MED ORDER — ACETAMINOPHEN 325 MG PO TABS: 650.0000 mg | ORAL_TABLET | Freq: Four times a day (QID) | ORAL | Status: DC | PRN

## 2011-12-19 MED ORDER — HYDRALAZINE HCL 20 MG/ML IJ SOLN
10.0000 mg | INTRAMUSCULAR | Status: DC | PRN
Start: 2011-12-19 — End: 2011-12-24
  Administered 2011-12-21 (×2): 10 mg via INTRAVENOUS
  Filled 2011-12-19 (×2): qty 0.5

## 2011-12-19 MED ORDER — CHLORHEXIDINE GLUCONATE 0.12 % MT SOLN
15.0000 mL | Freq: Two times a day (BID) | OROMUCOSAL | Status: DC
  Administered 2011-12-19 – 2011-12-23 (×10): 15 mL via OROMUCOSAL
  Filled 2011-12-19 (×8): qty 15

## 2011-12-19 MED ORDER — BIOTENE DRY MOUTH MT LIQD
15.0000 mL | Freq: Two times a day (BID) | OROMUCOSAL | Status: DC
  Administered 2011-12-19 – 2011-12-23 (×10): 15 mL via OROMUCOSAL

## 2011-12-19 NOTE — Progress Notes (Signed)
1 Day Post-Op  Subjective: No complaints  Objective: Vital signs in last 24 hours: Temp:  [97.7 F (36.5 C)-99.8 F (37.7 C)] 99.8 F (37.7 C) (06/26 0532) Pulse Rate:  [67-81] 81  (06/26 0532) Resp:  [12-25] 16  (06/26 0532) BP: (124-163)/(62-84) 144/68 mmHg (06/26 0532) SpO2:  [93 %-100 %] 100 % (06/26 0532) Weight:  [164 lb (74.39 kg)] 164 lb (74.39 kg) (06/25 2029) Last BM Date: 12/17/11  Intake/Output from previous day: 06/25 0701 - 06/26 0700 In: 2774 [I.V.:2724; IV Piggyback:50] Out: 980 [Urine:920; Blood:60] Intake/Output this shift:    Resp: clear to auscultation bilaterally Cardio: regular rate and rhythm, S1, S2 normal, no murmur, click, rub or gallop Incision/Wound:dressing dry  Lab Results:   Basename 12/18/11 1416  WBC 12.1*  HGB 12.6  HCT 37.5  PLT 222   BMET  Basename 12/18/11 1416  NA --  K --  CL --  CO2 --  GLUCOSE --  BUN --  CREATININE 0.64  CALCIUM --   PT/INR No results found for this basename: LABPROT:2,INR:2 in the last 72 hours ABG No results found for this basename: PHART:2,PCO2:2,PO2:2,HCO3:2 in the last 72 hours  Studies/Results: No results found.  Anti-infectives: Anti-infectives     Start     Dose/Rate Route Frequency Ordered Stop   12/18/11 0730   ertapenem (INVANZ) 1 g in sodium chloride 0.9 % 50 mL IVPB  Status:  Discontinued        1 g 100 mL/hr over 30 Minutes Intravenous To Surgery 12/18/11 0721 12/18/11 1208          Assessment/Plan: s/p Procedure(s) (LRB): COLON RESECTION LAPAROSCOPIC (N/A) D/C foley OOB Start clears Hydralazine for BP  LOS: 1 day    Alvaro Aungst A. 12/19/2011

## 2011-12-20 ENCOUNTER — Encounter (HOSPITAL_COMMUNITY): Payer: Self-pay | Admitting: Surgery

## 2011-12-20 NOTE — Progress Notes (Signed)
General Surgery Note  LOS: 2 days  POD# 2  Assessment/Plan: 1. Right COLON RESECTION, LAPAROSCOPIC - T. Cornett - 12/18/2011  Doing well post op.  Taking clear liquids, but no bowel function.  Continue clear liquids.  Needs to ambulate more.   2.  VTE - Lovenox 3.  Hypertension  Subjective:  Doing okay with liquids.  She says she is hungry.  Granddaughter in room with patient. Objective:   Filed Vitals:   12/20/11 1200  BP:   Pulse:   Temp:   Resp: 20     Intake/Output from previous day:  06/26 0701 - 06/27 0700 In: 120 [P.O.:120] Out: 1650 [Urine:1650]  Intake/Output this shift:  Total I/O In: -  Out: 900 [Urine:900]   Physical Exam:   General: WN AA F who is alert and oriented.    HEENT: Normal. Pupils equal. .   Lungs: Clear.   Abdomen: Soft.  I do not hear any BS.   Wound: Dressings dry.   Neurologic:  Grossly intact to motor and sensory function.   Psychiatric: Has normal mood and affect. Behavior is normal   Lab Results:    Basename 12/19/11 0500 12/18/11 1416  WBC 8.3 12.1*  HGB 12.1 12.6  HCT 36.5 37.5  PLT 211 222    BMET   Basename 12/19/11 0500 12/18/11 1416  NA 141 --  K 3.6 --  CL 106 --  CO2 27 --  GLUCOSE 146* --  BUN 4* --  CREATININE 0.65 0.64  CALCIUM 8.9 --    PT/INR  No results found for this basename: LABPROT:2,INR:2 in the last 72 hours  ABG  No results found for this basename: PHART:2,PCO2:2,PO2:2,HCO3:2 in the last 72 hours   Studies/Results:  No results found.   Anti-infectives:   Anti-infectives     Start     Dose/Rate Route Frequency Ordered Stop   12/18/11 0730   ertapenem (INVANZ) 1 g in sodium chloride 0.9 % 50 mL IVPB  Status:  Discontinued        1 g 100 mL/hr over 30 Minutes Intravenous To Surgery 12/18/11 0721 12/18/11 1208          Ovidio Kin, MD, FACS Pager: [phone],   [facility] [place] [facility] Office: [phone] 12/20/2011

## 2011-12-21 MED ORDER — HYDROMORPHONE HCL PF 1 MG/ML IJ SOLN: 1.0000 mg | INTRAMUSCULAR | Status: DC | PRN

## 2011-12-21 MED ORDER — POLYETHYLENE GLYCOL 3350 17 G PO PACK
17.0000 g | PACK | Freq: Every day | ORAL | Status: DC
  Administered 2011-12-21: 17 g via ORAL
  Filled 2011-12-21 (×4): qty 1

## 2011-12-21 NOTE — Progress Notes (Signed)
3 Days Post-Op  Subjective: Bowels moving.  No nausea or vomiting.  Objective: Vital signs in last 24 hours: Temp:  [98.7 F (37.1 C)-99.1 F (37.3 C)] 99.1 F (37.3 C) (06/28 0519) Pulse Rate:  [72-113] 93  (06/28 0730) Resp:  [18-22] 22  (06/28 0755) BP: (132-176)/(62-101) 132/64 mmHg (06/28 0730) SpO2:  [97 %-100 %] 100 % (06/28 0755) FiO2 (%):  [43 %-45 %] 43 % (06/27 1631) Last BM Date: 12/17/11  Intake/Output from previous day: 06/27 0701 - 06/28 0700 In: 2049 [P.O.:960; I.V.:1089] Out: 900 [Urine:900] Intake/Output this shift:    Resp: clear to auscultation bilaterally Cardio: regular rate and rhythm, S1, S2 normal, no murmur, click, rub or gallop Incision/Wound:clean dry intact.   Lab Results:   Basename 12/19/11 0500 12/18/11 1416  WBC 8.3 12.1*  HGB 12.1 12.6  HCT 36.5 37.5  PLT 211 222   BMET  Basename 12/19/11 0500 12/18/11 1416  NA 141 --  K 3.6 --  CL 106 --  CO2 27 --  GLUCOSE 146* --  BUN 4* --  CREATININE 0.65 0.64  CALCIUM 8.9 --   PT/INR No results found for this basename: LABPROT:2,INR:2 in the last 72 hours ABG No results found for this basename: PHART:2,PCO2:2,PO2:2,HCO3:2 in the last 72 hours  Studies/Results: No results found.  Anti-infectives: Anti-infectives     Start     Dose/Rate Route Frequency Ordered Stop   12/18/11 0730   ertapenem (INVANZ) 1 g in sodium chloride 0.9 % 50 mL IVPB  Status:  Discontinued        1 g 100 mL/hr over 30 Minutes Intravenous To Surgery 12/18/11 0721 12/18/11 1208          Assessment/Plan: s/p Procedure(s) (LRB): COLON RESECTION LAPAROSCOPIC (N/A) Advance diet D/C PCA OOB Path pending.  LOS: 3 days    Meris Reede A. 12/21/2011

## 2011-12-22 NOTE — Progress Notes (Signed)
4 Days Post-Op  Subjective: Feels pretty good. hungry  Objective: Vital signs in last 24 hours: Temp:  [98.1 F (36.7 C)-99.2 F (37.3 C)] 98.6 F (37 C) (06/29 0534) Pulse Rate:  [72-110] 78  (06/29 0534) Resp:  [20] 20  (06/29 0534) BP: (125-144)/(53-57) 144/57 mmHg (06/29 0534) SpO2:  [96 %-98 %] 96 % (06/29 0534) Last BM Date: 12/21/11  Intake/Output from previous day: 06/28 0701 - 06/29 0700 In: 1796 [P.O.:340; I.V.:1456] Out: -  Intake/Output this shift: Total I/O In: 331 [I.V.:331] Out: -   GI: soft, nontender. good flatus  Lab Results:  No results found for this basename: WBC:2,HGB:2,HCT:2,PLT:2 in the last 72 hours BMET No results found for this basename: NA:2,K:2,CL:2,CO2:2,GLUCOSE:2,BUN:2,CREATININE:2,CALCIUM:2 in the last 72 hours PT/INR No results found for this basename: LABPROT:2,INR:2 in the last 72 hours ABG No results found for this basename: PHART:2,PCO2:2,PO2:2,HCO3:2 in the last 72 hours  Studies/Results: No results found.  Anti-infectives: Anti-infectives     Start     Dose/Rate Route Frequency Ordered Stop   12/18/11 0730   ertapenem (INVANZ) 1 g in sodium chloride 0.9 % 50 mL IVPB  Status:  Discontinued        1 g 100 mL/hr over 30 Minutes Intravenous To Surgery 12/18/11 0721 12/18/11 1208          Assessment/Plan: s/p Procedure(s) (LRB): COLON RESECTION LAPAROSCOPIC (N/A) Advance diet Ambulate Hopefully ready to go home tomorrow  LOS: 4 days    TOTH III,Marshay Slates S 12/22/2011

## 2011-12-23 NOTE — Progress Notes (Signed)
5 Days Post-Op  Subjective: Passing flatus, but still having cramping pain with po  Objective: Vital signs in last 24 hours: Temp:  [98.4 F (36.9 C)-98.6 F (37 C)] 98.4 F (36.9 C) (06/30 0515) Pulse Rate:  [71-86] 71  (06/30 0515) Resp:  [18-20] 18  (06/30 0515) BP: (120-125)/(56-63) 125/56 mmHg (06/30 0515) SpO2:  [95 %-99 %] 95 % (06/30 0515) Last BM Date: 12/22/11  Intake/Output from previous day: 06/29 0701 - 06/30 0700 In: 1411 [P.O.:1080; I.V.:331] Out: -  Intake/Output this shift:    Abdomen soft, non distended, almost non tender  Lab Results:  No results found for this basename: WBC:2,HGB:2,HCT:2,PLT:2 in the last 72 hours BMET No results found for this basename: NA:2,K:2,CL:2,CO2:2,GLUCOSE:2,BUN:2,CREATININE:2,CALCIUM:2 in the last 72 hours PT/INR No results found for this basename: LABPROT:2,INR:2 in the last 72 hours ABG No results found for this basename: PHART:2,PCO2:2,PO2:2,HCO3:2 in the last 72 hours  Studies/Results: No results found.  Anti-infectives: Anti-infectives     Start     Dose/Rate Route Frequency Ordered Stop   12/18/11 0730   ertapenem (INVANZ) 1 g in sodium chloride 0.9 % 50 mL IVPB  Status:  Discontinued        1 g 100 mL/hr over 30 Minutes Intravenous To Surgery 12/18/11 0721 12/18/11 1208          Assessment/Plan: s/p Procedure(s) (LRB): COLON RESECTION LAPAROSCOPIC (N/A)  Given discomfort, will keep until tomorrow  LOS: 5 days    Analycia Khokhar A 12/23/2011

## 2011-12-24 MED ORDER — OXYCODONE-ACETAMINOPHEN 5-325 MG PO TABS: 1.0000 | ORAL_TABLET | ORAL | Status: AC | PRN

## 2011-12-24 NOTE — Discharge Summary (Signed)
Physician Discharge Summary  Patient ID: [name] MRN: [id number] DOB/AGE: 64-Aug-1947 64 y.o.  Admit date: 12/18/2011 Discharge date: 12/24/2011  Admission Diagnoses: colon cancer  Discharge Diagnoses: same Active Problems:  * No active hospital problems. *   Past Medical History  Diagnosis Date  . Kidney stone   . Abdominal pain   . Cancer 11/2011    cancer in colon   Discharged Condition: good  Hospital Course: Pt did well.  Tolerated diet with some cramping.  Bowels moving at discharge.  Voiding well.  Wounds clean dry intact.  Ambulating well.  Vitals good.  No fever or chills.  Consults: None  Significant Diagnostic Studies: none  Treatments: surgery: laparoscopic partial colectomy.  Discharge Exam: Blood pressure 151/69, pulse 63, temperature 98.3 F (36.8 C), temperature source Oral, resp. rate 18, height 5\' 7"  (1.702 m), weight 164 lb (74.39 kg), SpO2 94.00%. Incision/Wound:clean dry intact.  Good bowel sounds.  Soft not tender non distended abdomen.  Disposition: 01-Home or Self Care  Discharge Orders    Future Orders Please Complete By Expires   Increase activity slowly      Driving Restrictions      Comments:   No driving for 2 weeks   Lifting restrictions      Comments:   No lifting for 2 weeks     Medication List  As of 12/24/2011  7:06 AM   TAKE these medications         oxyCODONE-acetaminophen 5-325 MG per tablet   Commonly known as: PERCOCET   Take 1-2 tablets by mouth every 4 (four) hours as needed.           Follow-up Information    Follow up with Zanaiya Calabria A., MD. Schedule an appointment as soon as possible for a visit in 1 week.   Contact information:   [employer] [address] [place] [address] [phone]          Signed: Harriette Bouillon A. 12/24/2011, 7:06 AM

## 2011-12-24 NOTE — Discharge Instructions (Signed)
[facility]      [facility] [phone]  OPEN ABDOMINAL SURGERY: POST OP INSTRUCTIONS  Always review your discharge instruction sheet given to you by the facility where your surgery was performed.  IF YOU HAVE DISABILITY OR FAMILY LEAVE FORMS, YOU MUST BRING THEM TO THE OFFICE FOR PROCESSING.  PLEASE DO NOT GIVE THEM TO YOUR DOCTOR.  1. A prescription for pain medication may be given to you upon discharge.  Take your pain medication as prescribed, if needed.  If narcotic pain medicine is not needed, then you may take acetaminophen (Tylenol) or ibuprofen (Advil) as needed. 2. Take your usually prescribed medications unless otherwise directed. 3. If you need a refill on your pain medication, please contact your pharmacy. They will contact our office to request authorization.  Prescriptions will not be filled after 5pm or on week-ends. 4. You should follow a light diet the first few days after arrival home, such as soup and crackers, pudding, etc.unless your doctor has advised otherwise. A high-fiber, low fat diet can be resumed as tolerated.   Be sure to include lots of fluids daily. Most patients will experience some swelling and bruising on the chest and neck area.  Ice packs will help.  Swelling and bruising can take several days to resolve 5. Most patients will experience some swelling and bruising in the area of the incision. Ice pack will help. Swelling and bruising can take several days to resolve..  6. It is common to experience some constipation if taking pain medication after surgery.  Increasing fluid intake and taking a stool softener will usually help or prevent this problem from occurring.  A mild laxative (Milk of Magnesia or Miralax) should be taken according to package directions if there are no bowel movements after 48 hours. 7.  You may have steri-strips (small skin tapes) in place directly over the incision.  These strips should be left on the skin for 7-10 days.  If your  surgeon used skin glue on the incision, you may shower in 24 hours.  The glue will flake off over the next 2-3 weeks.  Any sutures or staples will be removed at the office during your follow-up visit. You may find that a light gauze bandage over your incision may keep your staples from being rubbed or pulled. You may shower and replace the bandage daily. 8. ACTIVITIES:  You may resume regular (light) daily activities beginning the next day--such as daily self-care, walking, climbing stairs--gradually increasing activities as tolerated.  You may have sexual intercourse when it is comfortable.  Refrain from any heavy lifting or straining until approved by your doctor. a. You may drive when you no longer are taking prescription pain medication, you can comfortably wear a seatbelt, and you can safely maneuver your car and apply brakes b. Return to Work: ___________________________________ 9. You should see your doctor in the office for a follow-up appointment approximately two weeks after your surgery.  Make sure that you call for this appointment within a day or two after you arrive home to insure a convenient appointment time. OTHER INSTRUCTIONS:  _____________________________________________________________ _____________________________________________________________  WHEN TO CALL YOUR DOCTOR: 1. Fever over 101.0 2. Inability to urinate 3. Nausea and/or vomiting 4. Extreme swelling or bruising 5. Continued bleeding from incision. 6. Increased pain, redness, or drainage from the incision. 7. Difficulty swallowing or breathing 8. Muscle cramping or spasms. 9. Numbness or tingling in hands or feet or around lips.  The clinic staff is available to   answer your questions during regular business hours.  Please don't hesitate to call and ask to speak to one of the nurses if you have concerns.  For further questions, please visit [email or web address]   Low Fiber and Residue Restricted  Diet A low fiber diet restricts foods that contain carbohydrates that are not digested in the small intestine. A diet containing about 10 g of fiber is considered low fiber. The diet needs to be individualized to suit patient tolerances and preferences and to avoid unnecessary restrictions. Generally, the foods emphasized in a low fiber diet have no skins or seeds. They may have been processed to remove bran, germ, or husks. Cooking may not necessarily eliminate the fiber. Cooking may, in fact, enable a greater quantity of fiber to be consumed in a lesser volume. Legumes and nuts are also restricted. The term low residue has also been used to describe low fiber diets, although the two are not the same. Residue refers to any substance that adds to bowel (colonic) contents, such as sloughed cells and intestinal bacteria, in addition to fiber. Residue-containing foods, prunes and prune juice, milk, and connective tissue from meats may also need to be eliminated. It is important to eliminate these foods during sudden (acute) attacks of inflammatory bowel disease, when there is a partial obstruction due to another reason, or when minimal fecal output is desired. When these problems are gone, a more normal diet may be used. PURPOSE  Prevent blockage of a partially obstructed or narrowed gastrointestinal tract.   Reduce stool weight and volume.   Slow the movement of waste.  WHEN IS THIS DIET USED?  Acute phase of Crohn's disease, ulcerative colitis, regional enteritis, or diverticulitis.   Narrowing (stenosis) of intestinal or esophageal tubes (lumina).   Transitional diet following surgery, injury (trauma), or illness.  ADEQUACY This diet is nutritionally adequate based on individual food choices according to the Recommended Dietary Allowances of the [employer]. CHOOSING FOODS Check labels, especially on foods from the starch list. Often, dietary fiber content is listed with the  Nutrition Facts panel.  Breads and Starches  Allowed: White, [place], and pita breads, plain rolls, buns, or sweet rolls, doughnuts, waffles, pancakes, bagels. Plain muffins, sweet breads, biscuits, matzoth. Flour. Soda, saltine, or graham crackers. Pretzels, rusks, melba toast, zwieback. Cooked cereals: cornmeal, farina, cream cereals. Dry cereals: refined corn, wheat, rice, and oat cereals (check label). Potatoes prepared any way without skins, refined macaroni, spaghetti, noodles, refined rice.   Avoid: Bread, rolls, or crackers made with whole-wheat, multigrains, rye, bran seeds, nuts, or coconut. Corn tortillas, table-shells. Corn chips, tortilla chips. Cereals containing whole-grains, multigrains, bran, coconut, nuts, or raisins. Cooked or dry oatmeal. Coarse wheat cereals, granola. Cereals advertised as "high fiber." Potato skins. Whole-grain pasta, wild or brown rice. Popcorn.  Vegetables  Allowed:  Strained tomato and vegetable juices. Fresh: tender lettuce, cucumber, cabbage, spinach, bean sprouts. Cooked, canned: asparagus, bean sprouts, cut green or wax beans, cauliflower, pumpkin, beets, mushrooms, olives, spinach, yellow squash, tomato, tomato sauce (no seeds), zucchini (peeled), turnips. Canned sweet potatoes. Small amounts of celery, onion, radish, and green pepper may be used. Keep servings limited to  cup.   Avoid: Fresh, cooked, or canned: artichokes, baked beans, beet greens, broccoli, Brussels sprouts, French-style green beans, corn, kale, legumes, peas, sweet potatoes. Cooked: green or red cabbage, spinach. Avoid large servings of any vegetables.  Fruit  Allowed:  All fruit juices except prune juice. Cooked or canned: apricots applesauce, cantaloupe, cherries, grapefruit, grapes,  kiwi, mandarin oranges, peaches, pears, fruit cocktail, pineapple, plums, watermelon. Fresh: banana, grapes, cantaloupe, avocado, cherries, pineapple, grapefruit, kiwi, nectarines, peaches, oranges,  blueberries, plums. Keep servings limited to  cup or 1 piece.   Avoid: Fresh: apple with or without skin, apricots, mango, pears, raspberries, strawberries. Prune juice, stewed or dried prunes. Dried fruits, raisins, dates. Avoid large servings of all fresh fruits.  Meat and Meat Substitutes  Allowed:  Ground or well-cooked tender beef, ham, veal, lamb, pork, or poultry. Eggs, plain cheese. Fish, oysters, shrimp, lobster, other seafood. Liver, organ meats.   Avoid: Tough, fibrous meats with gristle. Peanut butter, smooth or chunky. Cheese with seeds, nuts, or other foods not allowed. Nuts, seeds, legumes, dried peas, beans, lentils.  Milk  Allowed:  All milk products except those not allowed. Milk and milk product consumption should be minimal when low residue is desired.   Avoid: Yogurt that contains nuts or seeds.  Soups and Combination Foods  Allowed:  Bouillon, broth, or cream soups made from allowed foods. Any strained soup. Casseroles or mixed dishes made with allowed foods.   Avoid: Soups made from vegetables that are not allowed or that contain other foods not allowed.  Desserts and Sweets  Allowed:  Plain cakes and cookies, pie made with allowed fruit, pudding, custard, cream pie. Gelatin, fruit, ice, sherbet, frozen ice pops. Ice cream, ice milk without nuts. Plain hard candy, honey, jelly, molasses, syrup, sugar, chocolate syrup, gumdrops, marshmallows.   Avoid: Desserts, cookies, or candies that contain nuts, peanut butter, or dried fruits. Jams, preserves with seeds, marmalade.  Fats and Oils  Allowed:  Margarine, butter, cream, mayonnaise, salad oils, plain salad dressings made from allowed foods. Plain gravy, crisp bacon without rind.   Avoid: Seeds, nuts, olives. Avocados.  Beverages  Allowed:  All, except those listed to avoid.   Avoid: Fruit juices with high pulp, prune juice.  Condiments  Allowed:  Ketchup, mustard, horseradish, vinegar, cream sauce, cheese  sauce, cocoa powder. Spices in moderation: allspice, basil, bay leaves, celery powder or leaves, cinnamon, cumin powder, curry powder, ginger, mace, marjoram, onion or garlic powder, oregano, paprika, parsley flakes, ground pepper, rosemary, sage, savory, tarragon, thyme, turmeric.   Avoid: Coconut, pickles.  SAMPLE MEAL PLAN The following menu is provided as a sample. Your daily menu plans will vary. Be sure to include a minimum of the following each day in order to provide essential nutrients for the adult:  Starch/Bread/Cereal Group, 6 servings.   Fruit/Vegetable Group, 5 servings.   Meat/Meat Substitute Group, 2 servings.   Milk/Milk Substitute Group, 2 servings.  A serving is equal to  cup for fruits, vegetables, and cooked cereals or 1 piece for foods such as a piece of bread, 1 orange, or 1 apple. For dry cereals and crackers, use serving sizes listed on the label. Combination foods may count as full or partial servings from various food groups. Fats, desserts, and sweets may be added to the meal plan after the requirements for essential nutrients are met. SAMPLE MENU Breakfast   cup orange juice.   1 boiled egg.   1 slice white toast.   Margarine.    cup cornflakes.   1 cup milk.   Beverage.  Lunch   cup chicken noodle soup.   2 to 3 oz sliced roast beef.   2 slices seedless rye bread.   Mayonnaise.    cup tomato juice.   1 small banana.   Beverage.  Dinner  3 oz baked chicken.  cup scalloped potatoes.    cup cooked beets.   White dinner roll.   Margarine.    cup canned peaches.   Beverage.  Document Released: 12/01/2001 Document Revised: 05/31/2011 Document Reviewed: 05/14/2011 [facility] Patient Information 2012 [place], [place].       May need full liquid diet if solids cause cramps.       Full Liquid Diet The full liquid diet includes those foods that are liquid or will become liquid at body temperature. This diet is very  restrictive. Its use should be limited to a short period of time and only under the advice or supervision of your caregiver or dietitian.  A high-calorie, high-protein supplement should be used to meet your nutritional requirements when the full liquid diet is continued for more than 2 or 3 days. If this diet is to be used for an extended period of time (more than 7 days), a multivitamin should be considered. REASONS FOR USE As a transition diet between the clear liquid diet and solid foods.  When patients cannot tolerate solid foods.  ADEQUACY The full liquid diet is nutritionally inadequate according to the Recommended Dietary Allowances of the [employer], except in ascorbic acid and calcium. Protein requirements can be met if adequate amounts of dairy products are consumed daily. The full liquid diet can be nutritionally adequate if it is fortified with a nutritional supplement. Your caregiver can give you recommendations on liquids that have nutritional supplements included in them. CHOOSING FOODS Breads and Starches Allowed: None are allowed except crackers that are pureed (made into a thick, smooth soup) in soup. Cooked, refined corn, oat, rice, rye, and wheat cereals are also allowed.  Avoid: Any others.  Potatoes/Pasta/Rice Allowed: None except pureed in soup.  Avoid: Any others.  Vegetables Allowed: Strained tomato or vegetable juice. Vegetables pureed in soup.  Avoid: Any others.  Fruit Allowed: Any strained fruit juices and fruit drinks. Include 1 serving of citrus or vitamin C-enriched fruit juice daily.  Avoid: Any others.  Meat and Meat Substitutes Allowed: Eggs in custard, eggnog mix, eggs used in ice cream or pudding.  Avoid: Any meat, fish, or fowl. All cheese. All other cooked or raw eggs.  Milk Allowed: Milk and milk-based beverages, including milk shakes and instant breakfast mixes. Smooth yogurt.  Avoid: Any others. Avoid dairy products if not tolerated.   Soups and Combination Foods Allowed: Broth, strained cream soups. Strained, broth-based soups.  Avoid: Any others.  Desserts and Sweets Allowed: Custard, flavored gelatin, tapioca, plain ice cream, sherbet, smooth pudding, junket, fruit ices, frozen ice pops, pudding pops. Other frozen bars with cream, frozen fudge pops, chocolate syrup. Sugar, honey, jelly, syrup.  Avoid: Any others.  Fats and Oils Allowed: Margarine, butter, cream, sour cream, oils.  Avoid: Any others.  Beverages Allowed: All.  Avoid: None.  Condiments Allowed: Iodized salt, pepper, spices, flavorings. Cocoa powder.  Avoid: Any others.  SAMPLE MEAL PLAN Breakfast  cup orange juice.  1 cup cooked wheat cereal.  1 cup milk.  1 cup beverage (coffee or tea).  Cream or sugar, if desired.  Midmorning Snack 1 cup pasteurized eggnog (made from powdered eggs mixed with milk, not raw eggs).  Lunch 1 cup cream soup.   cup fruit juice.  1 cup milk.   cup custard.  1 cup beverage (coffee or tea).  Cream or sugar, if desired.  Midafternoon Snack 1 cup milk shake.  Dinner 1 cup cream soup.   cup fruit juice.  1  cup milk.   cup pudding.  1 cup beverage (coffee or tea).  Cream or sugar, if desired.  Evening Snack 1 cup supplement.  To increase calories, add sugar, cream, butter, or margarine if possible. Nutritional supplements will also increase the total calories. The above sample meal plan cannot meet the Recommended Dietary Allowances of the [employer] without appropriate supplementation under the guidance of your caregiver or dietitian. Document Released: 06/11/2005 Document Revised: 05/31/2011 Document Reviewed: 03/14/2007 [facility] Patient Information 2012 [place], [place].

## 2011-12-28 ENCOUNTER — Ambulatory Visit (INDEPENDENT_AMBULATORY_CARE_PROVIDER_SITE_OTHER): Payer: BC Managed Care – PPO | Admitting: Surgery

## 2011-12-28 ENCOUNTER — Telehealth: Payer: Self-pay | Admitting: Oncology

## 2011-12-28 ENCOUNTER — Encounter (INDEPENDENT_AMBULATORY_CARE_PROVIDER_SITE_OTHER): Payer: Self-pay | Admitting: Surgery

## 2011-12-28 VITALS — BP 124/82 | HR 60 | Temp 97.4°F | Resp 14 | Ht 65.0 in | Wt 159.4 lb

## 2011-12-28 DIAGNOSIS — C189 Malignant neoplasm of colon, unspecified: Secondary | ICD-10-CM

## 2011-12-28 NOTE — Telephone Encounter (Signed)
S/w pt dtr in law [name] re appt for 7/11 @ 2:30 pm w/BS. Jasmine December given appt d/t/location/name/number. Pt was unavailable.

## 2011-12-28 NOTE — Progress Notes (Signed)
The patient returns after colon resection.  They are doing well.  They are having good bowel movements and tolerating a regular diet.     Final path  is a T2N1MX colon cancer.  This was from transverse colon.   On exam the incision is clean dry intact.  No redness or drainage.   S/P partial colectomy stage 3 colon cancer Follow up 1 month. Refer to oncology

## 2011-12-28 NOTE — Patient Instructions (Signed)
Return 1 month.  Refer to oncology.

## 2011-12-31 ENCOUNTER — Telehealth: Payer: Self-pay | Admitting: Oncology

## 2011-12-31 NOTE — Telephone Encounter (Signed)
Referred by Dr. Harriette Bouillon Dx- Colon Ca

## 2012-01-03 ENCOUNTER — Ambulatory Visit (HOSPITAL_BASED_OUTPATIENT_CLINIC_OR_DEPARTMENT_OTHER): Payer: BC Managed Care – PPO | Admitting: Oncology

## 2012-01-03 ENCOUNTER — Ambulatory Visit: Payer: BC Managed Care – PPO

## 2012-01-03 ENCOUNTER — Telehealth: Payer: Self-pay | Admitting: Oncology

## 2012-01-03 ENCOUNTER — Encounter: Payer: Self-pay | Admitting: Oncology

## 2012-01-03 ENCOUNTER — Encounter: Payer: Self-pay | Admitting: *Deleted

## 2012-01-03 VITALS — BP 136/72 | HR 65 | Temp 97.1°F | Ht 65.0 in | Wt 160.5 lb

## 2012-01-03 DIAGNOSIS — F17200 Nicotine dependence, unspecified, uncomplicated: Secondary | ICD-10-CM

## 2012-01-03 DIAGNOSIS — C189 Malignant neoplasm of colon, unspecified: Secondary | ICD-10-CM

## 2012-01-03 DIAGNOSIS — R911 Solitary pulmonary nodule: Secondary | ICD-10-CM

## 2012-01-03 DIAGNOSIS — C779 Secondary and unspecified malignant neoplasm of lymph node, unspecified: Secondary | ICD-10-CM

## 2012-01-03 NOTE — Telephone Encounter (Signed)
gv pt appt schedule for July. Per Dr. Truett Perna cx 7/11 ct order. Sent staff message via EPIC to Ina Kick (Dr. Rosezena Sensor nurse) @ [facility] to schedule port placement w/Dr. Luisa Hart.

## 2012-01-03 NOTE — Progress Notes (Signed)
Patient came in today as a new patient and she has one Editor, commissioning.I did explain to her our financial assistance and Co-Pay assistance program we offer here at the cancer center,she said that she was [place] with her insurance and that its been paying pretty good.
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
## 2012-01-03 NOTE — Progress Notes (Signed)
Family very interested in smoking cessation for patient. Patient agrees to look into smoking cessation materials. Provided information sheet on [employer] smoking cessation classes.

## 2012-01-04 ENCOUNTER — Other Ambulatory Visit (INDEPENDENT_AMBULATORY_CARE_PROVIDER_SITE_OTHER): Payer: Self-pay

## 2012-01-04 ENCOUNTER — Other Ambulatory Visit: Payer: Self-pay | Admitting: Radiology

## 2012-01-04 DIAGNOSIS — C189 Malignant neoplasm of colon, unspecified: Secondary | ICD-10-CM

## 2012-01-07 ENCOUNTER — Encounter (HOSPITAL_COMMUNITY): Payer: Self-pay | Admitting: Pharmacy Technician

## 2012-01-08 ENCOUNTER — Telehealth: Payer: Self-pay | Admitting: *Deleted

## 2012-01-08 ENCOUNTER — Encounter: Payer: Self-pay | Admitting: *Deleted

## 2012-01-08 NOTE — Telephone Encounter (Signed)
Called [name] to follow up discussion about participating in the CALGB 14782 clinical trial. She has declined participation.

## 2012-01-09 ENCOUNTER — Ambulatory Visit (HOSPITAL_COMMUNITY)
Admission: RE | Admit: 2012-01-09 | Discharge: 2012-01-09 | Disposition: A | Discharge status: Home / Self Care | Payer: BC Managed Care – PPO | Source: Ambulatory Visit | Attending: Surgery | Admitting: Surgery

## 2012-01-09 ENCOUNTER — Encounter (HOSPITAL_COMMUNITY): Payer: Self-pay

## 2012-01-09 ENCOUNTER — Other Ambulatory Visit (INDEPENDENT_AMBULATORY_CARE_PROVIDER_SITE_OTHER): Payer: Self-pay | Admitting: Surgery

## 2012-01-09 DIAGNOSIS — C189 Malignant neoplasm of colon, unspecified: Secondary | ICD-10-CM

## 2012-01-09 DIAGNOSIS — F17200 Nicotine dependence, unspecified, uncomplicated: Secondary | ICD-10-CM | POA: Insufficient documentation

## 2012-01-09 LAB — CBC WITH DIFFERENTIAL/PLATELET
Basophils Relative: 1 % (ref 0–1)
Eosinophils Absolute: 0.2 10*3/uL (ref 0.0–0.7)
Eosinophils Relative: 4 % (ref 0–5)
MCH: 29.2 pg (ref 26.0–34.0)
MCHC: 32.4 g/dL (ref 30.0–36.0)
MCV: 90.3 fL (ref 78.0–100.0)
Neutrophils Relative %: 38 % — ABNORMAL LOW (ref 43–77)
Platelets: 312 10*3/uL (ref 150–400)
RDW: 12.8 % (ref 11.5–15.5)

## 2012-01-09 LAB — BASIC METABOLIC PANEL
BUN: 7 mg/dL (ref 6–23)
Calcium: 9.4 mg/dL (ref 8.4–10.5)
Creatinine, Ser: 0.51 mg/dL (ref 0.50–1.10)
GFR calc non Af Amer: >90 mL/min (ref >90)
Glucose, Bld: 86 mg/dL (ref 70–99)
Sodium: 138 mEq/L (ref 135–145)

## 2012-01-09 LAB — PROTIME-INR
INR: 0.95 (ref 0.00–1.49)
Prothrombin Time: 12.9 seconds (ref 11.6–15.2)

## 2012-01-09 MED ORDER — SODIUM CHLORIDE 0.9 % IV SOLN
Freq: Once | INTRAVENOUS | Status: AC
  Administered 2012-01-09: 13:00:00 via INTRAVENOUS

## 2012-01-09 MED ORDER — VANCOMYCIN HCL IN DEXTROSE 1-5 GM/200ML-% IV SOLN
1000.0000 mg | Freq: Once | INTRAVENOUS | Status: AC
  Administered 2012-01-09: 1000 mg via INTRAVENOUS

## 2012-01-09 MED ORDER — VANCOMYCIN HCL IN DEXTROSE 1-5 GM/200ML-% IV SOLN
INTRAVENOUS | Status: AC
  Filled 2012-01-09: qty 200

## 2012-01-09 MED ORDER — FENTANYL CITRATE 0.05 MG/ML IJ SOLN
INTRAMUSCULAR | Status: AC | PRN
  Administered 2012-01-09: 50 ug via INTRAVENOUS
  Administered 2012-01-09: 100 ug via INTRAVENOUS
  Administered 2012-01-09: 50 ug via INTRAVENOUS

## 2012-01-09 MED ORDER — MIDAZOLAM HCL 5 MG/5ML IJ SOLN
INTRAMUSCULAR | Status: AC | PRN
  Administered 2012-01-09: 1 mg via INTRAVENOUS
  Administered 2012-01-09: 2 mg via INTRAVENOUS
  Administered 2012-01-09: 1 mg via INTRAVENOUS

## 2012-01-09 MED ORDER — MIDAZOLAM HCL 2 MG/2ML IJ SOLN
INTRAMUSCULAR | Status: AC
  Filled 2012-01-09: qty 6

## 2012-01-09 MED ORDER — FENTANYL CITRATE 0.05 MG/ML IJ SOLN
INTRAMUSCULAR | Status: AC
  Filled 2012-01-09: qty 6

## 2012-01-09 MED ORDER — HEPARIN SOD (PORK) LOCK FLUSH 100 UNIT/ML IV SOLN
INTRAVENOUS | Status: AC | PRN
  Administered 2012-01-09: 500 [IU]

## 2012-01-09 NOTE — Procedures (Signed)
Successful placement of right IJ approach port-a-cath with tip at superior aspect of the right atrium. The catheter is ready for immediate use. No immediate post procedural complications.  

## 2012-01-09 NOTE — H&P (Signed)
Chief Complaint: Colon cancer Referring Physician:Cornett HPI: [name] is an 66 y.o. female with hx of colon cancer and recent partial colectomy. Has now recovered and seen Oncology who is going to treat with chemotherapy. She is referred here for portacath placement. She states she has otherwise been doing well since surgery.  Past Medical History:  Past Medical History  Diagnosis Date  . Kidney stone   . Abdominal pain   . Cancer 11/2011    cancer in colon    Past Surgical History:  Past Surgical History  Procedure Date  . Cholecystectomy   . Goiter removal    . Kidney stone surgery   . Tonsillectomy     as child  . Colon resection 12/18/2011    Procedure: COLON RESECTION LAPAROSCOPIC;  Surgeon: Clovis Pu. Cornett, MD;  Location: [facility] OR;  Service: General;  Laterality: N/A;    Family History:  Family History  Problem Relation Age of Onset  . Breast cancer Mother   . Heart disease Mother   . Hypertension Other     Social History:  reports that she has been smoking Cigarettes.  She has a 8.75 pack-year smoking history. She has never used smokeless tobacco. She reports that she does not drink alcohol or use illicit drugs.  Allergies:  Allergies  Allergen Reactions  . Aspirin Nausea And Vomiting  . Advil (Ibuprofen) Palpitations  . Penicillins Rash    Medications: NO regular prescription meds.  Please HPI for pertinent positives, otherwise complete 10 system ROS negative.  Physical Exam: Temp: 98.7  HR: 63  RR: 16  BP 143/73  O2: 99% room air   General Appearance:  Alert, cooperative, no distress, appears stated age  Head:  Normocephalic, without obvious abnormality, atraumatic  ENT: Unremarkable  Neck: Supple, symmetrical, trachea midline, no adenopathy, thyroid: not enlarged, symmetric, no tenderness/mass/nodules  Lungs:   Clear to auscultation bilaterally, no w/r/r, respirations unlabored without use of accessory muscles.  Chest Wall:  No tenderness or  deformity  Heart:  Regular rate and rhythm, S1, S2 normal, no murmur, rub or gallop.  Abdomen:   Soft, non-tender, non distended. Healing surgical scars without signs of infection.  Extremities: Extremities normal, atraumatic, no cyanosis or edema  Neurologic: Normal affect, no gross deficits.   Labs: Pending  Assessment/Plan Colon cancer s/p resection For port today, discussed procedure including risks and complications. Consent signed in chart.  Brayton El PA-C 01/09/2012, 12:54 PM

## 2012-01-10 ENCOUNTER — Encounter: Payer: Self-pay | Admitting: *Deleted

## 2012-01-10 ENCOUNTER — Other Ambulatory Visit: Payer: BC Managed Care – PPO

## 2012-01-10 ENCOUNTER — Ambulatory Visit (HOSPITAL_BASED_OUTPATIENT_CLINIC_OR_DEPARTMENT_OTHER): Payer: BC Managed Care – PPO | Admitting: Nurse Practitioner

## 2012-01-10 ENCOUNTER — Ambulatory Visit: Payer: BC Managed Care – PPO | Admitting: Nutrition

## 2012-01-10 ENCOUNTER — Telehealth: Payer: Self-pay | Admitting: Oncology

## 2012-01-10 VITALS — BP 178/80 | HR 58 | Temp 97.3°F | Ht 65.0 in | Wt 163.9 lb

## 2012-01-10 DIAGNOSIS — B3789 Other sites of candidiasis: Secondary | ICD-10-CM

## 2012-01-10 DIAGNOSIS — R911 Solitary pulmonary nodule: Secondary | ICD-10-CM

## 2012-01-10 DIAGNOSIS — C189 Malignant neoplasm of colon, unspecified: Secondary | ICD-10-CM

## 2012-01-10 DIAGNOSIS — B49 Unspecified mycosis: Secondary | ICD-10-CM

## 2012-01-10 MED ORDER — PROCHLORPERAZINE MALEATE 10 MG PO TABS: 10.0000 mg | ORAL_TABLET | Freq: Four times a day (QID) | ORAL | Status: DC | PRN

## 2012-01-10 MED ORDER — FLUCONAZOLE 100 MG PO TABS: 100.0000 mg | ORAL_TABLET | Freq: Every day | ORAL | Status: AC

## 2012-01-10 MED ORDER — LIDOCAINE-PRILOCAINE 2.5-2.5 % EX CREA: TOPICAL_CREAM | CUTANEOUS | Status: DC | PRN

## 2012-01-10 NOTE — Telephone Encounter (Signed)
gv pt appt schedule for July/August. °

## 2012-01-10 NOTE — Progress Notes (Signed)
OFFICE PROGRESS NOTE  Interval history:  [name] returns as scheduled. She underwent Port-A-Cath placement on 01/09/2012. She attended the chemotherapy education class earlier today.  She overall is feeling well. Bowels moving regularly. She denies bleeding. No nausea or vomiting. She notes mild pain associated with the Port-A-Cath. She continues to note a rash in the region of the right groin.   Objective: Blood pressure 178/80, pulse 58, temperature 97.3 F (36.3 C), temperature source Oral, height 5\' 5"  (1.651 m), weight 163 lb 14.4 oz (74.345 kg).  Oropharynx is without thrush or ulceration. Lungs are clear. Regular cardiac rhythm. Abdomen is soft and nontender. No hepatomegaly. Well-healed midline incision. Steri-Strips intact. Extremities are without edema. Calves are soft and nontender. Moist appearing erythematous rash, well-demarcated, right groin.  Lab Results: Lab Results  Component Value Date   WBC 6.3 01/09/2012   HGB 12.3 01/09/2012   HCT 38.0 01/09/2012   MCV 90.3 01/09/2012   PLT 312 01/09/2012    Chemistry:    Chemistry      Component Value Date/Time   NA 138 01/09/2012 1315   K 4.1 01/09/2012 1315   CL 102 01/09/2012 1315   CO2 26 01/09/2012 1315   BUN 7 01/09/2012 1315   CREATININE 0.51 01/09/2012 1315      Component Value Date/Time   CALCIUM 9.4 01/09/2012 1315   ALKPHOS 87 11/30/2011 0900   AST 16 11/30/2011 0900   ALT 11 11/30/2011 0900   BILITOT 0.2* 11/30/2011 0900       Studies/Results: Ir Fluoro Guide Cv Line Right  01/09/2012  *RADIOLOGY REPORT*  Indication: Colon cancer, in need of intravenous access for chemotherapy administration.  IMPLANTED PORT A CATH PLACEMENT WITH ULTRASOUND AND FLUOROSCOPIC GUIDANCE  Sedation: Versed 4 mg IV; Fentanyl 200 mcg IV; Vancomycin 1 gm IV; IV antibiotic was given in an appropriate time interval prior to skin puncture.  Total Moderate Sedation Time: 25 minutes.  Contrast: None  Fluoroscopy Time: 0.6 minutes.  Complications:  None immediate  Procedure:  The procedure, risks, benefits, and alternatives were explained to the patient.  Questions regarding the procedure were encouraged and answered.  The patient understands and consents to the procedure.  The right neck and chest were prepped with chlorhexidine in a sterile fashion, and a sterile drape was applied covering the operative field.  Maximum barrier sterile technique with sterile gowns and gloves were used for the procedure.  A timeout was performed prior to the initiation of the procedure.  Local anesthesia was provided with 1% lidocaine with epinephrine.  After creating a small venotomy incision, a micropuncture kit was utilized to access the right internal jugular vein under direct, real-time ultrasound guidance.  Ultrasound image documentation was performed.  The microwire was kinked to measure appropriate catheter length.  A subcutaneous port pocket was then created along the upper chest wall utilizing a combination of sharp and blunt dissection.  The pocket was irrigated with sterile saline.  A single lumen ISP power injectable port was chosen for placement.  The 8 Fr catheter was tunneled from the port pocket site to the venotomy incision.  The port was placed in the pocket.  The external catheter was trimmed to appropriate length.  At the venotomy, an 8 Fr peel-away sheath was placed over a guidewire under fluoroscopic guidance.  The catheter was then placed through the sheath and the sheath was removed.  Final catheter positioning was confirmed and documented with a fluoroscopic spot radiograph.  The port was accessed  with a Huber needle, aspirated and flushed with heparinized saline.  The venotomy site was closed with an interrupted 4-0 Vicryl suture. The port pocket incision was closed with interrupted 2-0 Vicryl suture and the skin was opposed with a running subcuticular 4-0 Vicryl suture.  Dermabond and Steri-strips were applied to both incisions.  Dressings were  placed.  The patient tolerated the procedure well without immediate post procedural complication.  Findings:  After catheter placement, the tip lies at the superior aspect of the right atrium.  The catheter aspirates and flushes normally and is ready for immediate use.  IMPRESSION:  Successful placement of a right internal jugular approach single lumen power injectable Port-A-Cath.  The catheter is ready for immediate use.  Original Report Authenticated By: Waynard Reeds, M.D.   Ir US Guide Vasc Access Right  01/09/2012  *RADIOLOGY REPORT*  Indication: Colon cancer, in need of intravenous access for chemotherapy administration.  IMPLANTED PORT A CATH PLACEMENT WITH ULTRASOUND AND FLUOROSCOPIC GUIDANCE  Sedation: Versed 4 mg IV; Fentanyl 200 mcg IV; Vancomycin 1 gm IV; IV antibiotic was given in an appropriate time interval prior to skin puncture.  Total Moderate Sedation Time: 25 minutes.  Contrast: None  Fluoroscopy Time: 0.6 minutes.  Complications: None immediate  Procedure:  The procedure, risks, benefits, and alternatives were explained to the patient.  Questions regarding the procedure were encouraged and answered.  The patient understands and consents to the procedure.  The right neck and chest were prepped with chlorhexidine in a sterile fashion, and a sterile drape was applied covering the operative field.  Maximum barrier sterile technique with sterile gowns and gloves were used for the procedure.  A timeout was performed prior to the initiation of the procedure.  Local anesthesia was provided with 1% lidocaine with epinephrine.  After creating a small venotomy incision, a micropuncture kit was utilized to access the right internal jugular vein under direct, real-time ultrasound guidance.  Ultrasound image documentation was performed.  The microwire was kinked to measure appropriate catheter length.  A subcutaneous port pocket was then created along the upper chest wall utilizing a combination of  sharp and blunt dissection.  The pocket was irrigated with sterile saline.  A single lumen ISP power injectable port was chosen for placement.  The 8 Fr catheter was tunneled from the port pocket site to the venotomy incision.  The port was placed in the pocket.  The external catheter was trimmed to appropriate length.  At the venotomy, an 8 Fr peel-away sheath was placed over a guidewire under fluoroscopic guidance.  The catheter was then placed through the sheath and the sheath was removed.  Final catheter positioning was confirmed and documented with a fluoroscopic spot radiograph.  The port was accessed with a Huber needle, aspirated and flushed with heparinized saline.  The venotomy site was closed with an interrupted 4-0 Vicryl suture. The port pocket incision was closed with interrupted 2-0 Vicryl suture and the skin was opposed with a running subcuticular 4-0 Vicryl suture.  Dermabond and Steri-strips were applied to both incisions.  Dressings were placed.  The patient tolerated the procedure well without immediate post procedural complication.  Findings:  After catheter placement, the tip lies at the superior aspect of the right atrium.  The catheter aspirates and flushes normally and is ready for immediate use.  IMPRESSION:  Successful placement of a right internal jugular approach single lumen power injectable Port-A-Cath.  The catheter is ready for immediate use.  Original  Report Authenticated By: Waynard Reeds, M.D.    Medications: I have reviewed the patient's current medications.  Assessment/Plan:  1. 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 2. Tiny lung nodules seen on a CT of the chest 10/19/2011. 3. Ongoing tobacco use. 4. Endometrial thickening on CT of the abdomen 10/02/2011. 5. Port-A-Cath placement 01/09/2012. 6. Yeast infection right groin. She will complete a course of Diflucan.  Disposition-Ms.  Schertzer has been diagnosed with stage III colon cancer. She has decided against enrollment on the CALGB 21308 study. Dr. Truett Perna recommends adjuvant FOLFOX chemotherapy for 12 cycles. She has attended the chemotherapy education class. We again reviewed potential toxicities associated with the chemotherapy at today's visit. She will return for cycle 1 FOLFOX on 01/21/2012. She will return for a followup visit and cycle 2 FOLFOX on 02/04/2012. She will contact the office the interim with any problems.  Patient seen with Dr. Truett Perna.  [name] ANP/GNP-BC

## 2012-01-10 NOTE — Assessment & Plan Note (Signed)
This is a 66 year old female patient of Dr. Kalman Drape diagnosed with colon cancer status post hemicolectomy receiving 12 cycles of FOLFOX.  MEDICAL HISTORY INCLUDES:  Kidney stone, cholecystectomy, and tobacco usage.  MEDICATIONS INCLUDE:  Percocet.  LABS:  Glucose of 146 on June 26.  HEIGHT:  65 inches. WEIGHT:  163.9 pounds. USUAL BODY WEIGHT:  Roughly 160-165 pounds. BMI:  27.27.  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  She is in the habit of eating 1 meal at work and then reports she does not really eat much the rest of the day.  She does not like water very much but has been trying to drink a little bit more. 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  She typically consumes fast food at her 1 meal a day, but she does sometime consume cereal or bagels, cheese, whole milk and yogurt.  She typically drinks [facility] and coffee.  NUTRITION DIAGNOSIS:  Food and nutrition related knowledge deficit related to new diagnosis of colon cancer and upcoming chemotherapy as evidenced by no prior need for nutrition related information.  INTERVENTION:  I have educated the patient and her son on the importance of small frequent meals and snacks throughout the day consisting of higher protein food and adequate calories for weight maintenance.  I have given her some examples of foods she could consume at snack time. I have encouraged her to try some oral nutrition supplements and I have provided samples of these products for her to try.  I have also given her some whey protein in the form of Unjury to help with her protein intake since she consumes very little protein throughout the day.  I provided fact sheets for her to refer to and I have answered her questions.  MONITORING/EVALUATION (GOALS):  That patient will tolerate adequate calories and protein for weight maintenance  throughout treatment.  NEXT VISIT:  Monday, August 12, during chemotherapy.  ______________________________ [name], RD,CSO,LDN Clinical Nutrition Specialist BN/MEDQ  D:  01/10/2012  T:  01/10/2012  Job:  [id number] [id number]

## 2012-01-20 ENCOUNTER — Other Ambulatory Visit: Payer: Self-pay | Admitting: Oncology

## 2012-01-20 DIAGNOSIS — C801 Malignant (primary) neoplasm, unspecified: Secondary | ICD-10-CM | POA: Insufficient documentation

## 2012-01-21 ENCOUNTER — Ambulatory Visit (HOSPITAL_BASED_OUTPATIENT_CLINIC_OR_DEPARTMENT_OTHER): Payer: BC Managed Care – PPO

## 2012-01-21 VITALS — BP 157/79 | HR 74 | Temp 97.8°F

## 2012-01-21 DIAGNOSIS — Z5111 Encounter for antineoplastic chemotherapy: Secondary | ICD-10-CM

## 2012-01-21 DIAGNOSIS — C189 Malignant neoplasm of colon, unspecified: Secondary | ICD-10-CM

## 2012-01-21 DIAGNOSIS — C801 Malignant (primary) neoplasm, unspecified: Secondary | ICD-10-CM

## 2012-01-21 MED ORDER — DEXTROSE 5 % IV SOLN
85.0000 mg/m2 | Freq: Once | INTRAVENOUS | Status: AC
  Administered 2012-01-21: 155 mg via INTRAVENOUS
  Filled 2012-01-21: qty 31

## 2012-01-21 MED ORDER — SODIUM CHLORIDE 0.9 % IV SOLN
2400.0000 mg/m2 | INTRAVENOUS | Status: DC
  Administered 2012-01-21: 4450 mg via INTRAVENOUS
  Filled 2012-01-21: qty 89

## 2012-01-21 MED ORDER — ONDANSETRON 8 MG/50ML IVPB (CHCC)
8.0000 mg | Freq: Once | INTRAVENOUS | Status: AC
  Administered 2012-01-21: 8 mg via INTRAVENOUS

## 2012-01-21 MED ORDER — DEXTROSE 5 % IV SOLN
Freq: Once | INTRAVENOUS | Status: AC
  Administered 2012-01-21: 10:00:00 via INTRAVENOUS

## 2012-01-21 MED ORDER — LEUCOVORIN CALCIUM INJECTION 350 MG
400.0000 mg/m2 | Freq: Once | INTRAVENOUS | Status: AC
  Administered 2012-01-21: 740 mg via INTRAVENOUS
  Filled 2012-01-21: qty 37

## 2012-01-21 MED ORDER — FLUOROURACIL CHEMO INJECTION 2.5 GM/50ML
400.0000 mg/m2 | Freq: Once | INTRAVENOUS | Status: AC
  Administered 2012-01-21: 750 mg via INTRAVENOUS
  Filled 2012-01-21: qty 15

## 2012-01-21 MED ORDER — DEXAMETHASONE SODIUM PHOSPHATE 10 MG/ML IJ SOLN
10.0000 mg | Freq: Once | INTRAMUSCULAR | Status: AC
  Administered 2012-01-21: 10 mg via INTRAVENOUS

## 2012-01-21 NOTE — Patient Instructions (Addendum)
[facility] Discharge Instructions for Patients Receiving Chemotherapy  Today you received the following chemotherapy agents oxaliplatin,leucovorin, 5FU  To help prevent nausea and vomiting after your treatment, we encourage you to take your nausea medication. Begin taking it at 4pm and take it as often as prescribed for the next 48 hours if needed.   If you develop nausea and vomiting that is not controlled by your nausea medication, call the clinic. If it is after clinic hours your family physician or the after hours number for the clinic or go to the Emergency Department.   BELOW ARE SYMPTOMS THAT SHOULD BE REPORTED IMMEDIATELY:  *FEVER GREATER THAN 100.5 F  *CHILLS WITH OR WITHOUT FEVER  NAUSEA AND VOMITING THAT IS NOT CONTROLLED WITH YOUR NAUSEA MEDICATION  *UNUSUAL SHORTNESS OF BREATH  *UNUSUAL BRUISING OR BLEEDING  TENDERNESS IN MOUTH AND THROAT WITH OR WITHOUT PRESENCE OF ULCERS  *URINARY PROBLEMS  *BOWEL PROBLEMS  UNUSUAL RASH Items with * indicate a potential emergency and should be followed up as soon as possible.  One of the nurses will contact you 24 hours after your treatment. Please let the nurse know about any problems that you may have experienced. Feel free to call the clinic you have any questions or concerns. The clinic phone number is [phone].   I have been informed and understand all the instructions given to me. I know to contact the clinic, my physician, or go to the Emergency Department if any problems should occur. I do not have any questions at this time, but understand that I may call the clinic during office hours   should I have any questions or need assistance in obtaining follow up care.    __________________________________________  _____________  __________ Signature of Patient or Authorized Representative            Date                   Time    __________________________________________ Nurse's Signature

## 2012-01-21 NOTE — Progress Notes (Signed)
Met with patient -first chemo visit.  Patient says she is eating and drinking well and had a very good chemo education class.  She denies questions re: side effects at this time.  Her son is with her today and she states she has very good support at home.  This RN encouraged patient to call for any questions or assist.  Will continue to follow.

## 2012-01-22 ENCOUNTER — Telehealth: Payer: Self-pay | Admitting: *Deleted

## 2012-01-22 NOTE — Telephone Encounter (Signed)
Patient says she is doing well.  Does not have any complaints or questions.  Says she is doing fine and is connected to the infusion with no problems.  Asked that she call if any changes in status.

## 2012-01-22 NOTE — Telephone Encounter (Signed)
Message copied by Augusto Garbe on Tue Jan 22, 2012 11:29 AM ------      Message from: Gaylord Shih      Created: Mon Jan 21, 2012  1:58 PM      Regarding: chemo follow up       Folfox, Dr Truett Perna

## 2012-01-23 ENCOUNTER — Ambulatory Visit (HOSPITAL_BASED_OUTPATIENT_CLINIC_OR_DEPARTMENT_OTHER): Payer: BC Managed Care – PPO

## 2012-01-23 VITALS — BP 127/80 | HR 63 | Temp 98.1°F

## 2012-01-23 DIAGNOSIS — C801 Malignant (primary) neoplasm, unspecified: Secondary | ICD-10-CM

## 2012-01-23 DIAGNOSIS — C189 Malignant neoplasm of colon, unspecified: Secondary | ICD-10-CM

## 2012-01-23 DIAGNOSIS — Z452 Encounter for adjustment and management of vascular access device: Secondary | ICD-10-CM

## 2012-01-23 MED ORDER — HEPARIN SOD (PORK) LOCK FLUSH 100 UNIT/ML IV SOLN
500.0000 [IU] | Freq: Once | INTRAVENOUS | Status: AC | PRN
  Administered 2012-01-23: 500 [IU]
  Filled 2012-01-23: qty 5

## 2012-01-23 MED ORDER — SODIUM CHLORIDE 0.9 % IJ SOLN
10.0000 mL | INTRAMUSCULAR | Status: DC | PRN
  Administered 2012-01-23: 10 mL
  Filled 2012-01-23: qty 10

## 2012-01-23 NOTE — Patient Instructions (Signed)
Call md with problems

## 2012-01-31 ENCOUNTER — Encounter (INDEPENDENT_AMBULATORY_CARE_PROVIDER_SITE_OTHER): Payer: Self-pay | Admitting: Surgery

## 2012-01-31 ENCOUNTER — Ambulatory Visit (INDEPENDENT_AMBULATORY_CARE_PROVIDER_SITE_OTHER): Payer: BC Managed Care – PPO | Admitting: Surgery

## 2012-01-31 VITALS — BP 128/72 | HR 70 | Temp 97.6°F | Resp 16 | Ht 65.0 in | Wt 161.4 lb

## 2012-01-31 DIAGNOSIS — Z9889 Other specified postprocedural states: Secondary | ICD-10-CM

## 2012-01-31 NOTE — Patient Instructions (Signed)
Return 6 months

## 2012-01-31 NOTE — Progress Notes (Signed)
Patient returns in followup. She is 6 weeks out from laparoscopic partial colectomy for stage III colon cancer. She is doing well. She is undergoing chemotherapy. No complaints noted.  Exam: Abdomen soft nontender. Incisions well-healed.  Impression: Stage III colon cancer status post laparoscopic-assisted partial colectomy  Plan: Return in 6 months. Continue chemotherapy per Dr. Truett Perna.

## 2012-02-03 ENCOUNTER — Other Ambulatory Visit: Payer: Self-pay | Admitting: Oncology

## 2012-02-04 ENCOUNTER — Ambulatory Visit (HOSPITAL_BASED_OUTPATIENT_CLINIC_OR_DEPARTMENT_OTHER): Payer: BC Managed Care – PPO | Admitting: Oncology

## 2012-02-04 ENCOUNTER — Telehealth: Payer: Self-pay | Admitting: Oncology

## 2012-02-04 ENCOUNTER — Ambulatory Visit: Payer: BC Managed Care – PPO | Admitting: Nutrition

## 2012-02-04 ENCOUNTER — Other Ambulatory Visit (HOSPITAL_BASED_OUTPATIENT_CLINIC_OR_DEPARTMENT_OTHER): Payer: BC Managed Care – PPO | Admitting: Lab

## 2012-02-04 ENCOUNTER — Ambulatory Visit (HOSPITAL_BASED_OUTPATIENT_CLINIC_OR_DEPARTMENT_OTHER): Payer: BC Managed Care – PPO

## 2012-02-04 VITALS — BP 154/84 | HR 71 | Temp 97.7°F | Resp 18 | Ht 65.0 in | Wt 166.1 lb

## 2012-02-04 DIAGNOSIS — C189 Malignant neoplasm of colon, unspecified: Secondary | ICD-10-CM

## 2012-02-04 DIAGNOSIS — C801 Malignant (primary) neoplasm, unspecified: Secondary | ICD-10-CM

## 2012-02-04 DIAGNOSIS — Z5111 Encounter for antineoplastic chemotherapy: Secondary | ICD-10-CM

## 2012-02-04 LAB — COMPREHENSIVE METABOLIC PANEL
AST: 16 U/L (ref 0–37)
Albumin: 3.5 g/dL (ref 3.5–5.2)
Alkaline Phosphatase: 75 U/L (ref 39–117)
BUN: 8 mg/dL (ref 6–23)
Glucose, Bld: 113 mg/dL — ABNORMAL HIGH (ref 70–99)
Potassium: 3.4 mEq/L — ABNORMAL LOW (ref 3.5–5.3)
Sodium: 139 mEq/L (ref 135–145)
Total Bilirubin: 0.2 mg/dL — ABNORMAL LOW (ref 0.3–1.2)

## 2012-02-04 LAB — CBC WITH DIFFERENTIAL/PLATELET
BASO%: 0.9 % (ref 0.0–2.0)
Basophils Absolute: 0.1 10*3/uL (ref 0.0–0.1)
EOS%: 4.6 % (ref 0.0–7.0)
MCH: 29.8 pg (ref 25.1–34.0)
MCHC: 33.9 g/dL (ref 31.5–36.0)
MCV: 87.9 fL (ref 79.5–101.0)
MONO%: 10.3 % (ref 0.0–14.0)
NEUT%: 33.2 % — ABNORMAL LOW (ref 38.4–76.8)
RDW: 13.5 % (ref 11.2–14.5)
lymph#: 2.9 10*3/uL (ref 0.9–3.3)

## 2012-02-04 MED ORDER — ALTEPLASE 2 MG IJ SOLR
2.0000 mg | Freq: Once | INTRAMUSCULAR | Status: DC | PRN
  Filled 2012-02-04: qty 2

## 2012-02-04 MED ORDER — HEPARIN SOD (PORK) LOCK FLUSH 100 UNIT/ML IV SOLN
500.0000 [IU] | Freq: Once | INTRAVENOUS | Status: DC | PRN
  Filled 2012-02-04: qty 5

## 2012-02-04 MED ORDER — OXALIPLATIN CHEMO INJECTION 100 MG/20ML
85.0000 mg/m2 | Freq: Once | INTRAVENOUS | Status: AC
  Administered 2012-02-04: 155 mg via INTRAVENOUS
  Filled 2012-02-04: qty 31

## 2012-02-04 MED ORDER — COLD PACK MISC ONCOLOGY
1.0000 | Freq: Once | Status: DC | PRN
  Filled 2012-02-04: qty 1

## 2012-02-04 MED ORDER — DEXTROSE 5 % IV SOLN
Freq: Once | INTRAVENOUS | Status: AC
  Administered 2012-02-04: 50 mL via INTRAVENOUS

## 2012-02-04 MED ORDER — SODIUM CHLORIDE 0.9 % IV SOLN
2400.0000 mg/m2 | INTRAVENOUS | Status: DC
  Administered 2012-02-04: 4450 mg via INTRAVENOUS
  Filled 2012-02-04: qty 89

## 2012-02-04 MED ORDER — FLUOROURACIL CHEMO INJECTION 2.5 GM/50ML
400.0000 mg/m2 | Freq: Once | INTRAVENOUS | Status: AC
  Administered 2012-02-04: 750 mg via INTRAVENOUS
  Filled 2012-02-04: qty 15

## 2012-02-04 MED ORDER — SODIUM CHLORIDE 0.9 % IJ SOLN
3.0000 mL | INTRAMUSCULAR | Status: DC | PRN
  Filled 2012-02-04: qty 10

## 2012-02-04 MED ORDER — HEPARIN SOD (PORK) LOCK FLUSH 100 UNIT/ML IV SOLN
250.0000 [IU] | Freq: Once | INTRAVENOUS | Status: DC | PRN
  Filled 2012-02-04: qty 5

## 2012-02-04 MED ORDER — DEXAMETHASONE SODIUM PHOSPHATE 10 MG/ML IJ SOLN
10.0000 mg | Freq: Once | INTRAMUSCULAR | Status: AC
  Administered 2012-02-04: 10 mg via INTRAVENOUS

## 2012-02-04 MED ORDER — HOT PACK MISC ONCOLOGY
1.0000 | Freq: Once | Status: DC | PRN
  Filled 2012-02-04: qty 1

## 2012-02-04 MED ORDER — SODIUM CHLORIDE 0.9 % IJ SOLN
10.0000 mL | INTRAMUSCULAR | Status: DC | PRN
  Filled 2012-02-04: qty 10

## 2012-02-04 MED ORDER — ONDANSETRON 8 MG/50ML IVPB (CHCC)
8.0000 mg | Freq: Once | INTRAVENOUS | Status: AC
  Administered 2012-02-04: 8 mg via INTRAVENOUS

## 2012-02-04 MED ORDER — LEUCOVORIN CALCIUM INJECTION 350 MG
400.0000 mg/m2 | Freq: Once | INTRAVENOUS | Status: AC
  Administered 2012-02-04: 740 mg via INTRAVENOUS
  Filled 2012-02-04: qty 37

## 2012-02-04 NOTE — Telephone Encounter (Signed)
pts made and printed for pt aom

## 2012-02-04 NOTE — Addendum Note (Signed)
Addended by: Wandalee Ferdinand on: 02/04/2012 12:01 PM   Modules accepted: Orders

## 2012-02-04 NOTE — Progress Notes (Signed)
    [facility]    OFFICE PROGRESS NOTE   INTERVAL HISTORY:   She returns as scheduled. She completed a first cycle of FOLFOX on 01/21/2012. She reports 1 episode of nausea on 01/23/2012. No other nausea. No mouth sores, diarrhea, or neuropathy symptoms.  Objective:  Vital signs in last 24 hours:  Blood pressure 154/84, pulse 71, temperature 97.7 F (36.5 C), temperature source Oral, resp. rate 18, height 5\' 5"  (1.651 m), weight 166 lb 1.6 oz (75.342 kg).    HEENT: No thrush or ulcer Resp: Lungs clear bilaterally Cardio: Regular rate and rhythm GI: No hepatomegaly, healed midline incision Vascular: No leg edema   Portacath/PICC-without erythema  Lab Results:  Lab Results  Component Value Date   WBC 5.7 02/04/2012   HGB 11.1* 02/04/2012   HCT 32.7* 02/04/2012   MCV 87.9 02/04/2012   PLT 268 02/04/2012   ANC 1.9    Medications: I have reviewed the patient's current medications.  Assessment/Plan: 1. 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 Adjuvant FOLFOX chemotherapy was initiated on 01/21/2012. 2. Tiny lung nodules seen on a CT of the chest 10/19/2011. 3. Ongoing tobacco use. 4. Endometrial thickening on CT of the abdomen 10/02/2011. 5. Port-A-Cath placement 01/09/2012.    Disposition:  She tolerated the first cycle of chemotherapy well. She will complete cycle 2 today. Ms. Heideman will return for an office visit and cycle 3 of FOLFOX in 2 weeks.   Thornton Papas, MD  02/04/2012  11:46 AM

## 2012-02-04 NOTE — Patient Instructions (Addendum)
[facility] Discharge Instructions for Patients Receiving Chemotherapy  Today you received the following chemotherapy agents Oxaliplatin/59fu/leucovorin To help prevent nausea and vomiting after your treatment, we encourage you to take your nausea medication as per Dr. Truett Perna.   If you develop nausea and vomiting that is not controlled by your nausea medication, call the clinic. If it is after clinic hours your family physician or the after hours number for the clinic or go to the Emergency Department.   BELOW ARE SYMPTOMS THAT SHOULD BE REPORTED IMMEDIATELY:  *FEVER GREATER THAN 100.5 F  *CHILLS WITH OR WITHOUT FEVER  NAUSEA AND VOMITING THAT IS NOT CONTROLLED WITH YOUR NAUSEA MEDICATION  *UNUSUAL SHORTNESS OF BREATH  *UNUSUAL BRUISING OR BLEEDING  TENDERNESS IN MOUTH AND THROAT WITH OR WITHOUT PRESENCE OF ULCERS  *URINARY PROBLEMS  *BOWEL PROBLEMS  UNUSUAL RASH Items with * indicate a potential emergency and should be followed up as soon as possible. . Feel free to call the clinic you have any questions or concerns. The clinic phone number is [phone].   I have been informed and understand all the instructions given to me. I know to contact the clinic, my physician, or go to the Emergency Department if any problems should occur. I do not have any questions at this time, but understand that I may call the clinic during office hours   should I have any questions or need assistance in obtaining follow up care.    __________________________________________  _____________  __________ Signature of Patient or Authorized Representative            Date                   Time    __________________________________________ Nurse's Signature

## 2012-02-04 NOTE — Progress Notes (Signed)
Ms. Corwin reports she continues to eat well.  She is eating more often. She says she is eating a lot more than she used to.  Her weight has increased to 166.1 pounds documented August 12 which is slightly above her usual body weight.  She denies nutritional side effects at this time.  Her son is requesting information on an Unjury protein supplement of chicken soup, which I have provided samples for in the past.  NUTRITION DIAGNOSIS:  Food and nutrition related knowledge deficit continues.  INTERVENTION:  I have educated the patient to continue small amounts of higher calorie, higher protein foods to promote weight maintenance.  I have provided additional samples of Unjury chicken soup to provide approximately 100 calories and 21 g of protein.  I have also given them information on purchasing this product.  I have answered their questions.  The patient and son are appreciative.  MONITORING/EVALUATION/GOALS:  The patient will continue to tolerate adequate calories and protein for weight maintenance.  NEXT VISIT:  Monday, August 26 during chemotherapy.   ______________________________ Zenovia Jarred, RD, CSO, LDN Clinical Nutrition Specialist BN/MEDQ  D:  02/04/2012  T:  02/04/2012  Job:  [id number]

## 2012-02-06 ENCOUNTER — Ambulatory Visit (HOSPITAL_BASED_OUTPATIENT_CLINIC_OR_DEPARTMENT_OTHER): Payer: BC Managed Care – PPO

## 2012-02-06 VITALS — BP 158/82 | HR 55 | Temp 97.4°F

## 2012-02-06 DIAGNOSIS — C801 Malignant (primary) neoplasm, unspecified: Secondary | ICD-10-CM

## 2012-02-06 DIAGNOSIS — C189 Malignant neoplasm of colon, unspecified: Secondary | ICD-10-CM

## 2012-02-06 DIAGNOSIS — Z452 Encounter for adjustment and management of vascular access device: Secondary | ICD-10-CM

## 2012-02-06 MED ORDER — HEPARIN SOD (PORK) LOCK FLUSH 100 UNIT/ML IV SOLN
500.0000 [IU] | Freq: Once | INTRAVENOUS | Status: AC | PRN
  Administered 2012-02-06: 500 [IU]
  Filled 2012-02-06: qty 5

## 2012-02-06 MED ORDER — SODIUM CHLORIDE 0.9 % IJ SOLN
10.0000 mL | INTRAMUSCULAR | Status: DC | PRN
  Administered 2012-02-06: 10 mL
  Filled 2012-02-06: qty 10

## 2012-02-17 ENCOUNTER — Other Ambulatory Visit: Payer: Self-pay | Admitting: Oncology

## 2012-02-18 ENCOUNTER — Other Ambulatory Visit (HOSPITAL_BASED_OUTPATIENT_CLINIC_OR_DEPARTMENT_OTHER): Payer: BC Managed Care – PPO | Admitting: Lab

## 2012-02-18 ENCOUNTER — Ambulatory Visit: Payer: BC Managed Care – PPO | Admitting: Nutrition

## 2012-02-18 ENCOUNTER — Encounter: Payer: Self-pay | Admitting: *Deleted

## 2012-02-18 ENCOUNTER — Ambulatory Visit (HOSPITAL_BASED_OUTPATIENT_CLINIC_OR_DEPARTMENT_OTHER): Payer: BC Managed Care – PPO

## 2012-02-18 ENCOUNTER — Telehealth: Payer: Self-pay | Admitting: Oncology

## 2012-02-18 ENCOUNTER — Ambulatory Visit (HOSPITAL_BASED_OUTPATIENT_CLINIC_OR_DEPARTMENT_OTHER): Payer: BC Managed Care – PPO | Admitting: Nurse Practitioner

## 2012-02-18 VITALS — BP 145/78 | HR 67 | Temp 97.0°F | Resp 20 | Ht 65.0 in | Wt 164.6 lb

## 2012-02-18 DIAGNOSIS — C189 Malignant neoplasm of colon, unspecified: Secondary | ICD-10-CM

## 2012-02-18 DIAGNOSIS — C184 Malignant neoplasm of transverse colon: Secondary | ICD-10-CM

## 2012-02-18 DIAGNOSIS — Z5111 Encounter for antineoplastic chemotherapy: Secondary | ICD-10-CM

## 2012-02-18 DIAGNOSIS — C801 Malignant (primary) neoplasm, unspecified: Secondary | ICD-10-CM

## 2012-02-18 DIAGNOSIS — R110 Nausea: Secondary | ICD-10-CM

## 2012-02-18 LAB — CBC WITH DIFFERENTIAL/PLATELET
EOS%: 3 % (ref 0.0–7.0)
Eosinophils Absolute: 0.2 10*3/uL (ref 0.0–0.5)
LYMPH%: 50.3 % — ABNORMAL HIGH (ref 14.0–49.7)
MCH: 29.8 pg (ref 25.1–34.0)
MCV: 87.7 fL (ref 79.5–101.0)
MONO%: 10.3 % (ref 0.0–14.0)
NEUT#: 2.1 10*3/uL (ref 1.5–6.5)
Platelets: 172 10*3/uL (ref 145–400)
RBC: 3.83 10*6/uL (ref 3.70–5.45)
nRBC: 0 % (ref 0–0)

## 2012-02-18 LAB — COMPREHENSIVE METABOLIC PANEL (CC13)
ALT: 12 U/L (ref 0–55)
AST: 17 U/L (ref 5–34)
Alkaline Phosphatase: 76 U/L (ref 40–150)
Sodium: 141 mEq/L (ref 136–145)
Total Bilirubin: 0.2 mg/dL (ref 0.20–1.20)
Total Protein: 6.8 g/dL (ref 6.4–8.3)

## 2012-02-18 MED ORDER — OXALIPLATIN CHEMO INJECTION 100 MG/20ML
85.0000 mg/m2 | Freq: Once | INTRAVENOUS | Status: AC
  Administered 2012-02-18: 155 mg via INTRAVENOUS
  Filled 2012-02-18: qty 31

## 2012-02-18 MED ORDER — PALONOSETRON HCL INJECTION 0.25 MG/5ML
0.2500 mg | Freq: Once | INTRAVENOUS | Status: AC
  Administered 2012-02-18: 0.25 mg via INTRAVENOUS

## 2012-02-18 MED ORDER — SODIUM CHLORIDE 0.9 % IJ SOLN
3.0000 mL | INTRAMUSCULAR | Status: DC | PRN
  Filled 2012-02-18: qty 10

## 2012-02-18 MED ORDER — DEXTROSE 5 % IV SOLN
Freq: Once | INTRAVENOUS | Status: AC
  Administered 2012-02-18: 11:00:00 via INTRAVENOUS

## 2012-02-18 MED ORDER — SODIUM CHLORIDE 0.9 % IV SOLN
2400.0000 mg/m2 | INTRAVENOUS | Status: DC
  Administered 2012-02-18: 4450 mg via INTRAVENOUS
  Filled 2012-02-18: qty 89

## 2012-02-18 MED ORDER — DEXAMETHASONE SODIUM PHOSPHATE 10 MG/ML IJ SOLN
10.0000 mg | Freq: Once | INTRAMUSCULAR | Status: AC
  Administered 2012-02-18: 10 mg via INTRAVENOUS

## 2012-02-18 MED ORDER — FLUOROURACIL CHEMO INJECTION 2.5 GM/50ML
400.0000 mg/m2 | Freq: Once | INTRAVENOUS | Status: AC
  Administered 2012-02-18: 750 mg via INTRAVENOUS
  Filled 2012-02-18: qty 15

## 2012-02-18 MED ORDER — DEXTROSE 5 % IV SOLN
400.0000 mg/m2 | Freq: Once | INTRAVENOUS | Status: AC
  Administered 2012-02-18: 740 mg via INTRAVENOUS
  Filled 2012-02-18: qty 37

## 2012-02-18 MED ORDER — LORAZEPAM 0.5 MG PO TABS: 0.5000 mg | ORAL_TABLET | Freq: Four times a day (QID) | ORAL | Status: AC | PRN

## 2012-02-18 NOTE — Telephone Encounter (Signed)
gv pt appt schedule for September.  °

## 2012-02-18 NOTE — Patient Instructions (Addendum)
[facility] Discharge Instructions for Patients Receiving Chemotherapy  Today you received the following chemotherapy agents Oxaliplatin, Leucovorin, Fluorouracil  To help prevent nausea and vomiting after your treatment, we encourage you to take your nausea medication Begin taking it at 7 pm and take it as often as prescribed for the next 24 to 72 hours.   If you develop nausea and vomiting that is not controlled by your nausea medication, call the clinic. If it is after clinic hours your family physician or the after hours number for the clinic or go to the Emergency Department.   BELOW ARE SYMPTOMS THAT SHOULD BE REPORTED IMMEDIATELY:  *FEVER GREATER THAN 100.5 F  *CHILLS WITH OR WITHOUT FEVER  NAUSEA AND VOMITING THAT IS NOT CONTROLLED WITH YOUR NAUSEA MEDICATION  *UNUSUAL SHORTNESS OF BREATH  *UNUSUAL BRUISING OR BLEEDING  TENDERNESS IN MOUTH AND THROAT WITH OR WITHOUT PRESENCE OF ULCERS  *URINARY PROBLEMS  *BOWEL PROBLEMS  UNUSUAL RASH Items with * indicate a potential emergency and should be followed up as soon as possible.  One of the nurses will contact you 24 hours after your treatment. Please let the nurse know about any problems that you may have experienced. Feel free to call the clinic you have any questions or concerns. The clinic phone number is [phone].   I have been informed and understand all the instructions given to me. I know to contact the clinic, my physician, or go to the Emergency Department if any problems should occur. I do not have any questions at this time, but understand that I may call the clinic during office hours   should I have any questions or need assistance in obtaining follow up care.    __________________________________________  _____________  __________ Signature of Patient or Authorized Representative            Date                   Time    __________________________________________ Nurse's  Signature    

## 2012-02-18 NOTE — Progress Notes (Signed)
OFFICE PROGRESS NOTE  Interval history:  [name] returns as scheduled. She completed cycle 2 FOLFOX 02/04/2012. She developed nausea on the day of pump discontinuation. Compazine was not effective. She had no vomiting. She denies mouth sores. She had loose stools lasting approximately one day. She noted the cold sensitivity lasted greater than 5 days. She denies persistent neuropathy symptoms.   Objective: Blood pressure 145/78, pulse 67, temperature 97 F (36.1 C), temperature source Oral, resp. rate 20, height 5\' 5"  (1.651 m), weight 164 lb 9.6 oz (74.662 kg).  Oropharynx is without thrush or ulceration. Lungs are clear. Regular cardiac rhythm. Port-A-Cath site is without erythema. Abdomen is soft and nontender. No hepatomegaly. Healed midline incision. Extremities without edema. Vibratory sense very mildly decreased over the fingertips.  Lab Results: Lab Results  Component Value Date   WBC 5.7 02/18/2012   HGB 11.4* 02/18/2012   HCT 33.6* 02/18/2012   MCV 87.7 02/18/2012   PLT 172 02/18/2012    Chemistry:    Chemistry      Component Value Date/Time   NA 139 02/04/2012 1049   K 3.4* 02/04/2012 1049   CL 104 02/04/2012 1049   CO2 27 02/04/2012 1049   BUN 8 02/04/2012 1049   CREATININE 0.52 02/04/2012 1049      Component Value Date/Time   CALCIUM 9.0 02/04/2012 1049   ALKPHOS 75 02/04/2012 1049   AST 16 02/04/2012 1049   ALT 10 02/04/2012 1049   BILITOT 0.2* 02/04/2012 1049       Studies/Results: No results found.  Medications: I have reviewed the patient's current medications.  Assessment/Plan:  1. 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 Adjuvant FOLFOX chemotherapy was initiated on 01/21/2012. 2. Delayed nausea following cycle 2 FOLFOX. Add Aloxi beginning with cycle 3. 3. Tiny lung nodules seen on a CT of the chest 10/19/2011. 4. Ongoing tobacco use. 5. Endometrial thickening on CT of the  abdomen 10/02/2011. 6. Port-A-Cath placement 01/09/2012.  Disposition-[name] appears stable. She has completed 2 cycles of FOLFOX chemotherapy. Plan to proceed with cycle 3 today as scheduled. She had delayed nausea following cycle 2. She will receive Aloxi beginning with today's treatment. She was also given a prescription for Ativan 0.5 mg every 6 hours as needed for nausea. She will return for a followup visit and cycle 4 in 2 weeks. She will contact the office in the interim with any problems.  [name] ANP/GNP-BC

## 2012-02-19 NOTE — Progress Notes (Signed)
[name] reports that she is doing well.  She continues to eat.  She denies nutrition side effects.  Her weight has declined slightly to 164.6 pounds, but this is within her usual body weight range.   NUTRITION DIAGNOSIS:  Food and nutrition-related knowledge deficit has improved.  INTERVENTION:  The patient was encouraged to continue higher protein meals and snacks throughout the day to promote weight maintenance.  I provided some additional coupons for the patient today.  The patient was appreciative.  MONITORING/EVALUATION/GOALS:  The patient has been able to tolerate adequate calories and protein for weight maintenance.  NEXT VISIT:  Monday, September 23rd during chemotherapy.   ______________________________ Zenovia Jarred, RD, CSO, LDN Clinical Nutrition Specialist BN/MEDQ  D:  02/18/2012  T:  02/19/2012  Job:  [id number]

## 2012-02-20 ENCOUNTER — Ambulatory Visit (HOSPITAL_BASED_OUTPATIENT_CLINIC_OR_DEPARTMENT_OTHER): Payer: BC Managed Care – PPO

## 2012-02-20 VITALS — BP 128/72 | HR 86 | Temp 98.1°F | Resp 20

## 2012-02-20 DIAGNOSIS — Z452 Encounter for adjustment and management of vascular access device: Secondary | ICD-10-CM

## 2012-02-20 DIAGNOSIS — C801 Malignant (primary) neoplasm, unspecified: Secondary | ICD-10-CM

## 2012-02-20 DIAGNOSIS — C184 Malignant neoplasm of transverse colon: Secondary | ICD-10-CM

## 2012-02-20 MED ORDER — HEPARIN SOD (PORK) LOCK FLUSH 100 UNIT/ML IV SOLN
500.0000 [IU] | Freq: Once | INTRAVENOUS | Status: AC | PRN
  Administered 2012-02-20: 500 [IU]
  Filled 2012-02-20: qty 5

## 2012-02-20 MED ORDER — SODIUM CHLORIDE 0.9 % IJ SOLN
10.0000 mL | INTRAMUSCULAR | Status: DC | PRN
  Administered 2012-02-20: 10 mL
  Filled 2012-02-20: qty 10

## 2012-02-27 ENCOUNTER — Telehealth: Payer: Self-pay | Admitting: *Deleted

## 2012-02-27 NOTE — Telephone Encounter (Signed)
Received walk in form at 10:30 from escort stating patient has left and asked for a call "to sign paper work".  Called patient at 11:30.  [name] has a short term disability form that is due tomorrow and she needs MD signature along with her signature needs to be witnessed.  Reports having previously mentioning this to MD but she moved.  The form was lost in the mail and she has just received it.    Asked that she get the form to [place] ASAP for [employer].  Explained that forms take 7 to 10 business days to complete but we'll work to get this ready.  Asked that she contact her HR Dept. Because MD is off today but due to return tomorrow.

## 2012-02-28 ENCOUNTER — Encounter: Payer: Self-pay | Admitting: Oncology

## 2012-02-28 NOTE — Progress Notes (Signed)
Faxed disability form to [facility] @ [employer] [phone].

## 2012-03-01 ENCOUNTER — Other Ambulatory Visit: Payer: Self-pay | Admitting: Oncology

## 2012-03-03 ENCOUNTER — Telehealth: Payer: Self-pay | Admitting: Oncology

## 2012-03-03 ENCOUNTER — Ambulatory Visit (HOSPITAL_BASED_OUTPATIENT_CLINIC_OR_DEPARTMENT_OTHER): Payer: BC Managed Care – PPO

## 2012-03-03 ENCOUNTER — Other Ambulatory Visit (HOSPITAL_BASED_OUTPATIENT_CLINIC_OR_DEPARTMENT_OTHER): Payer: BC Managed Care – PPO | Admitting: Lab

## 2012-03-03 ENCOUNTER — Ambulatory Visit (HOSPITAL_BASED_OUTPATIENT_CLINIC_OR_DEPARTMENT_OTHER): Payer: BC Managed Care – PPO | Admitting: Oncology

## 2012-03-03 VITALS — BP 124/69 | HR 79 | Temp 97.9°F | Resp 20 | Wt 162.1 lb

## 2012-03-03 DIAGNOSIS — C189 Malignant neoplasm of colon, unspecified: Secondary | ICD-10-CM

## 2012-03-03 DIAGNOSIS — Z5111 Encounter for antineoplastic chemotherapy: Secondary | ICD-10-CM

## 2012-03-03 DIAGNOSIS — R918 Other nonspecific abnormal finding of lung field: Secondary | ICD-10-CM

## 2012-03-03 DIAGNOSIS — F17200 Nicotine dependence, unspecified, uncomplicated: Secondary | ICD-10-CM

## 2012-03-03 DIAGNOSIS — C801 Malignant (primary) neoplasm, unspecified: Secondary | ICD-10-CM

## 2012-03-03 DIAGNOSIS — R110 Nausea: Secondary | ICD-10-CM

## 2012-03-03 LAB — COMPREHENSIVE METABOLIC PANEL (CC13)
ALT: 17 U/L (ref 0–55)
AST: 23 U/L (ref 5–34)
Calcium: 9 mg/dL (ref 8.4–10.4)
Chloride: 108 mEq/L — ABNORMAL HIGH (ref 98–107)
Creatinine: 0.7 mg/dL (ref 0.6–1.1)
Sodium: 141 mEq/L (ref 136–145)

## 2012-03-03 LAB — CBC WITH DIFFERENTIAL/PLATELET
BASO%: 0.6 % (ref 0.0–2.0)
EOS%: 2.9 % (ref 0.0–7.0)
HCT: 34.8 % (ref 34.8–46.6)
MCH: 29.7 pg (ref 25.1–34.0)
MCHC: 33.3 g/dL (ref 31.5–36.0)
NEUT%: 44.3 % (ref 38.4–76.8)
RBC: 3.9 10*6/uL (ref 3.70–5.45)
RDW: 15.3 % — ABNORMAL HIGH (ref 11.2–14.5)
lymph#: 2.2 10*3/uL (ref 0.9–3.3)

## 2012-03-03 MED ORDER — SODIUM CHLORIDE 0.9 % IV SOLN
2400.0000 mg/m2 | INTRAVENOUS | Status: DC
  Administered 2012-03-03: 4450 mg via INTRAVENOUS
  Filled 2012-03-03: qty 89

## 2012-03-03 MED ORDER — SODIUM CHLORIDE 0.9 % IV SOLN
150.0000 mg | Freq: Once | INTRAVENOUS | Status: AC
  Administered 2012-03-03: 150 mg via INTRAVENOUS
  Filled 2012-03-03: qty 5

## 2012-03-03 MED ORDER — DEXTROSE 5 % IV SOLN
Freq: Once | INTRAVENOUS | Status: AC
  Administered 2012-03-03: 11:00:00 via INTRAVENOUS

## 2012-03-03 MED ORDER — DEXAMETHASONE SODIUM PHOSPHATE 10 MG/ML IJ SOLN
10.0000 mg | Freq: Once | INTRAMUSCULAR | Status: AC
  Administered 2012-03-03: 10 mg via INTRAVENOUS

## 2012-03-03 MED ORDER — PALONOSETRON HCL INJECTION 0.25 MG/5ML
0.2500 mg | Freq: Once | INTRAVENOUS | Status: AC
  Administered 2012-03-03: 0.25 mg via INTRAVENOUS

## 2012-03-03 MED ORDER — DEXTROSE 5 % IV SOLN
400.0000 mg/m2 | Freq: Once | INTRAVENOUS | Status: AC
  Administered 2012-03-03: 740 mg via INTRAVENOUS
  Filled 2012-03-03: qty 37

## 2012-03-03 MED ORDER — FLUOROURACIL CHEMO INJECTION 2.5 GM/50ML
400.0000 mg/m2 | Freq: Once | INTRAVENOUS | Status: AC
  Administered 2012-03-03: 750 mg via INTRAVENOUS
  Filled 2012-03-03: qty 15

## 2012-03-03 MED ORDER — OXALIPLATIN CHEMO INJECTION 100 MG/20ML
85.0000 mg/m2 | Freq: Once | INTRAVENOUS | Status: AC
  Administered 2012-03-03: 155 mg via INTRAVENOUS
  Filled 2012-03-03: qty 31

## 2012-03-03 NOTE — Patient Instructions (Signed)
[name] [birth year] [id number]  [facility] Discharge Instructions  Your exam findings, labs and results were discussed with your MD today.  Filed Vitals:   03/03/12 0941  BP: 124/69  Pulse: 79  Temp: 97.9 F (36.6 C)  Resp: 20   Current outpatient prescriptions:lidocaine-prilocaine (EMLA) cream, Apply topically as needed. Apply to portacath site 1 hour prior to use, Disp: 30 g, Rfl: 2;  prochlorperazine (COMPAZINE) 10 MG tablet, Take 10 mg by mouth every 6 (six) hours as needed., Disp: , Rfl:   Please visit scheduling to obtain calendar for future appointments.  Please call the [facility] at [phone] during business hours should you have any further questions or need assistance in obtaining follow-up care. If you have a medical emergency, please dial 911.  Special Instructions:

## 2012-03-03 NOTE — Progress Notes (Signed)
   [facility]    OFFICE PROGRESS NOTE   INTERVAL HISTORY:   She returns as scheduled. She completed another cycle of FOLFOX on 02/18/2012. She reports nausea beginning on day 3 following chemotherapy. The nausea was not relieved with Compazine. Cold sensitivity for several days following chemotherapy. This has resolved. No neuropathy symptoms today. No mouth sores or diarrhea.  Objective:  Vital signs in last 24 hours:  Blood pressure 124/69, pulse 79, temperature 97.9 F (36.6 C), temperature source Oral, resp. rate 20, weight 162 lb 1.6 oz (73.528 kg).    HEENT: No thrush or ulcers Resp: Lungs clear bilaterally Cardio: Regular rate and rhythm GI: No hepatosplenomegaly Vascular: No leg edema  Skin: Hyperpigmentation at the palms   Portacath/PICC-without erythema  Lab Results:  Lab Results  Component Value Date   WBC 5.2 03/03/2012   HGB 11.6 03/03/2012   HCT 34.8 03/03/2012   MCV 89.2 03/03/2012   PLT 152 03/03/2012   ANC 2.3   Medications: I have reviewed the patient's current medications.  Assessment/Plan: 1. 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 Adjuvant FOLFOX chemotherapy was initiated on 01/21/2012. 2. Delayed nausea following cycle 2 FOLFOX. Aloxi was added with cycle 3. Persistent delayed nausea. Emend will be added with cycle 4. She will use Ativan as needed. She declines steroid prophylaxis.  3. Tiny lung nodules seen on a CT of the chest 10/19/2011. 4. Ongoing tobacco use. 5. Endometrial thickening on CT of the abdomen 10/02/2011. 6. Port-A-Cath placement 01/09/2012.  Disposition:  She has completed 3 cycles of adjuvant FOLFOX chemotherapy. The chemotherapy has been complicated by delayed nausea. Emend was added to the antiemetic premedication today. She will use Ativan as needed for persistent nausea.  [name] will return for an office visit and cycle 5  FOLFOX in 2 weeks.   Thornton Papas, MD  03/03/2012  10:21 AM

## 2012-03-03 NOTE — Telephone Encounter (Signed)
gv pt appt schedule for September/October.  °

## 2012-03-03 NOTE — Patient Instructions (Signed)
[facility] Discharge Instructions for Patients Receiving Chemotherapy  Today you received the following chemotherapy agents 5 Fu/Leucovorin/Oxaliplatin To help prevent nausea and vomiting after your treatment, we encourage you to take your nausea medication as prescribed.  If you develop nausea and vomiting that is not controlled by your nausea medication, call the clinic. If it is after clinic hours your family physician or the after hours number for the clinic or go to the Emergency Department.   BELOW ARE SYMPTOMS THAT SHOULD BE REPORTED IMMEDIATELY:  *FEVER GREATER THAN 100.5 F  *CHILLS WITH OR WITHOUT FEVER  NAUSEA AND VOMITING THAT IS NOT CONTROLLED WITH YOUR NAUSEA MEDICATION  *UNUSUAL SHORTNESS OF BREATH  *UNUSUAL BRUISING OR BLEEDING  TENDERNESS IN MOUTH AND THROAT WITH OR WITHOUT PRESENCE OF ULCERS  *URINARY PROBLEMS  *BOWEL PROBLEMS  UNUSUAL RASH Items with * indicate a potential emergency and should be followed up as soon as possible.  One of the nurses will contact you 24 hours after your treatment. Please let the nurse know about any problems that you may have experienced. Feel free to call the clinic you have any questions or concerns. The clinic phone number is [phone].   I have been informed and understand all the instructions given to me. I know to contact the clinic, my physician, or go to the Emergency Department if any problems should occur. I do not have any questions at this time, but understand that I may call the clinic during office hours   should I have any questions or need assistance in obtaining follow up care.    __________________________________________  _____________  __________ Signature of Patient or Authorized Representative            Date                   Time    __________________________________________ Nurse's Signature    

## 2012-03-05 ENCOUNTER — Ambulatory Visit (HOSPITAL_BASED_OUTPATIENT_CLINIC_OR_DEPARTMENT_OTHER): Payer: BC Managed Care – PPO

## 2012-03-05 VITALS — BP 150/83 | HR 62 | Temp 98.3°F | Resp 18

## 2012-03-05 DIAGNOSIS — Z452 Encounter for adjustment and management of vascular access device: Secondary | ICD-10-CM

## 2012-03-05 DIAGNOSIS — C801 Malignant (primary) neoplasm, unspecified: Secondary | ICD-10-CM

## 2012-03-05 DIAGNOSIS — C189 Malignant neoplasm of colon, unspecified: Secondary | ICD-10-CM

## 2012-03-05 MED ORDER — SODIUM CHLORIDE 0.9 % IJ SOLN
10.0000 mL | INTRAMUSCULAR | Status: DC | PRN
  Administered 2012-03-05: 10 mL
  Filled 2012-03-05: qty 10

## 2012-03-05 MED ORDER — HEPARIN SOD (PORK) LOCK FLUSH 100 UNIT/ML IV SOLN
500.0000 [IU] | Freq: Once | INTRAVENOUS | Status: AC | PRN
  Administered 2012-03-05: 500 [IU]
  Filled 2012-03-05: qty 5

## 2012-03-10 ENCOUNTER — Ambulatory Visit: Payer: BC Managed Care – PPO | Admitting: Nurse Practitioner

## 2012-03-16 ENCOUNTER — Other Ambulatory Visit: Payer: Self-pay | Admitting: Oncology

## 2012-03-17 ENCOUNTER — Telehealth: Payer: Self-pay | Admitting: Oncology

## 2012-03-17 ENCOUNTER — Ambulatory Visit (HOSPITAL_BASED_OUTPATIENT_CLINIC_OR_DEPARTMENT_OTHER): Payer: BC Managed Care – PPO

## 2012-03-17 ENCOUNTER — Other Ambulatory Visit (HOSPITAL_BASED_OUTPATIENT_CLINIC_OR_DEPARTMENT_OTHER): Payer: BC Managed Care – PPO | Admitting: Lab

## 2012-03-17 ENCOUNTER — Ambulatory Visit: Payer: BC Managed Care – PPO | Admitting: Nutrition

## 2012-03-17 ENCOUNTER — Ambulatory Visit (HOSPITAL_BASED_OUTPATIENT_CLINIC_OR_DEPARTMENT_OTHER): Payer: BC Managed Care – PPO | Admitting: Nurse Practitioner

## 2012-03-17 VITALS — BP 172/91 | HR 71 | Temp 98.5°F | Resp 20 | Ht 65.0 in | Wt 162.5 lb

## 2012-03-17 DIAGNOSIS — D6959 Other secondary thrombocytopenia: Secondary | ICD-10-CM

## 2012-03-17 DIAGNOSIS — C189 Malignant neoplasm of colon, unspecified: Secondary | ICD-10-CM

## 2012-03-17 DIAGNOSIS — C801 Malignant (primary) neoplasm, unspecified: Secondary | ICD-10-CM

## 2012-03-17 DIAGNOSIS — F17200 Nicotine dependence, unspecified, uncomplicated: Secondary | ICD-10-CM

## 2012-03-17 DIAGNOSIS — R911 Solitary pulmonary nodule: Secondary | ICD-10-CM

## 2012-03-17 DIAGNOSIS — Z5111 Encounter for antineoplastic chemotherapy: Secondary | ICD-10-CM

## 2012-03-17 LAB — COMPREHENSIVE METABOLIC PANEL (CC13)
Albumin: 3.6 g/dL (ref 3.5–5.0)
Alkaline Phosphatase: 91 U/L (ref 40–150)
CO2: 24 mEq/L (ref 22–29)
Chloride: 108 mEq/L — ABNORMAL HIGH (ref 98–107)
Glucose: 110 mg/dl — ABNORMAL HIGH (ref 70–99)
Potassium: 3.6 mEq/L (ref 3.5–5.1)
Sodium: 143 mEq/L (ref 136–145)
Total Protein: 6.3 g/dL — ABNORMAL LOW (ref 6.4–8.3)

## 2012-03-17 LAB — CBC WITH DIFFERENTIAL/PLATELET
Basophils Absolute: 0 10*3/uL (ref 0.0–0.1)
EOS%: 2.3 % (ref 0.0–7.0)
Eosinophils Absolute: 0.1 10*3/uL (ref 0.0–0.5)
HGB: 11.5 g/dL — ABNORMAL LOW (ref 11.6–15.9)
MONO#: 0.5 10*3/uL (ref 0.1–0.9)
NEUT#: 1.4 10*3/uL — ABNORMAL LOW (ref 1.5–6.5)
RDW: 15.6 % — ABNORMAL HIGH (ref 11.2–14.5)
WBC: 5.3 10*3/uL (ref 3.9–10.3)
lymph#: 3.3 10*3/uL (ref 0.9–3.3)

## 2012-03-17 MED ORDER — SODIUM CHLORIDE 0.9 % IV SOLN
2400.0000 mg/m2 | INTRAVENOUS | Status: DC
  Filled 2012-03-17: qty 89

## 2012-03-17 MED ORDER — DEXTROSE 5 % IV SOLN
400.0000 mg/m2 | Freq: Once | INTRAVENOUS | Status: AC
  Administered 2012-03-17: 740 mg via INTRAVENOUS
  Filled 2012-03-17: qty 37

## 2012-03-17 MED ORDER — HEPARIN SOD (PORK) LOCK FLUSH 100 UNIT/ML IV SOLN
500.0000 [IU] | Freq: Once | INTRAVENOUS | Status: AC | PRN
  Administered 2012-03-17: 500 [IU]
  Filled 2012-03-17: qty 5

## 2012-03-17 MED ORDER — DEXAMETHASONE SODIUM PHOSPHATE 10 MG/ML IJ SOLN
10.0000 mg | Freq: Once | INTRAMUSCULAR | Status: AC
  Administered 2012-03-17: 10 mg via INTRAVENOUS

## 2012-03-17 MED ORDER — FLUOROURACIL CHEMO INJECTION 2.5 GM/50ML
400.0000 mg/m2 | Freq: Once | INTRAVENOUS | Status: DC
  Filled 2012-03-17: qty 15

## 2012-03-17 MED ORDER — PALONOSETRON HCL INJECTION 0.25 MG/5ML
0.2500 mg | Freq: Once | INTRAVENOUS | Status: AC
  Administered 2012-03-17: 0.25 mg via INTRAVENOUS

## 2012-03-17 MED ORDER — DEXTROSE 5 % IV SOLN
Freq: Once | INTRAVENOUS | Status: AC
  Administered 2012-03-17: 10:00:00 via INTRAVENOUS

## 2012-03-17 MED ORDER — OXALIPLATIN CHEMO INJECTION 100 MG/20ML
85.0000 mg/m2 | Freq: Once | INTRAVENOUS | Status: AC
  Administered 2012-03-17: 155 mg via INTRAVENOUS
  Filled 2012-03-17: qty 31

## 2012-03-17 MED ORDER — SODIUM CHLORIDE 0.9 % IJ SOLN
10.0000 mL | INTRAMUSCULAR | Status: DC | PRN
  Administered 2012-03-17: 10 mL
  Filled 2012-03-17: qty 10

## 2012-03-17 MED ORDER — SODIUM CHLORIDE 0.9 % IV SOLN
150.0000 mg | Freq: Once | INTRAVENOUS | Status: AC
  Administered 2012-03-17: 150 mg via INTRAVENOUS
  Filled 2012-03-17: qty 5

## 2012-03-17 NOTE — Patient Instructions (Addendum)
 [facility] Discharge Instructions for Patients Receiving Chemotherapy  Today you received the following chemotherapy agents 6fu, Oxaliplatin, Leucovorin To help prevent nausea and vomiting after your treatment, we encourage you to take your nausea medication as directed by MD.  If you develop nausea and vomiting that is not controlled by your nausea medication, call the clinic. If it is after clinic hours your family physician or the after hours number for the clinic or go to the Emergency Department.   BELOW ARE SYMPTOMS THAT SHOULD BE REPORTED IMMEDIATELY:  *FEVER GREATER THAN 100.5 F  *CHILLS WITH OR WITHOUT FEVER  NAUSEA AND VOMITING THAT IS NOT CONTROLLED WITH YOUR NAUSEA MEDICATION  *UNUSUAL SHORTNESS OF BREATH  *UNUSUAL BRUISING OR BLEEDING  TENDERNESS IN MOUTH AND THROAT WITH OR WITHOUT PRESENCE OF ULCERS  *URINARY PROBLEMS  *BOWEL PROBLEMS  UNUSUAL RASH Items with * indicate a potential emergency and should be followed up as soon as possible.  One of the nurses will contact you 24 hours after your treatment. Please let the nurse know about any problems that you may have experienced. Feel free to call the clinic you have any questions or concerns. The clinic phone number is [phone].   I have been informed and understand all the instructions given to me. I know to contact the clinic, my physician, or go to the Emergency Department if any problems should occur. I do not have any questions at this time, but understand that I may call the clinic during office hours   should I have any questions or need assistance in obtaining follow up care.    __________________________________________  _____________  __________ Signature of Patient or Authorized Representative            Date                   Time    __________________________________________ Nurse's Signature

## 2012-03-17 NOTE — Telephone Encounter (Signed)
gv pt appt schedule for September and October.  °

## 2012-03-17 NOTE — Progress Notes (Signed)
Ok to treat per Lonna Cobb, NP despite counts.

## 2012-03-17 NOTE — Progress Notes (Signed)
OFFICE PROGRESS NOTE  Interval history:  [name] returns as scheduled. She completed cycle 4 FOLFOX on 03/03/2012. She noted a decrease in nausea following cycle 4. No vomiting. She denies mouth sores. She is having intermittent loose stools. She noted cold sensitivity in her mouth until yesterday. She denies neuropathy symptoms in the absence of cold exposure.   Objective: Blood pressure 172/91, pulse 71, temperature 98.5 F (36.9 C), temperature source Oral, resp. rate 20, height 5\' 5"  (1.651 m), weight 162 lb 8 oz (73.71 kg).  Oropharynx is without thrush or ulceration. Lungs are clear. Regular cardiac rhythm. Port-A-Cath site is without erythema. Abdomen is soft and nontender. No hepatomegaly. Extremities are without edema. Vibratory sense is mildly decreased over the fingertips per tuning fork exam.  Lab Results: Lab Results  Component Value Date   WBC 5.3 03/17/2012   HGB 11.5* 03/17/2012   HCT 35.1 03/17/2012   MCV 91.9 03/17/2012   PLT 96* 03/17/2012    Chemistry:    Chemistry      Component Value Date/Time   NA 141 03/03/2012 0924   NA 139 02/04/2012 1049   K 3.5 03/03/2012 0924   K 3.4* 02/04/2012 1049   CL 108* 03/03/2012 0924   CL 104 02/04/2012 1049   CO2 24 03/03/2012 0924   CO2 27 02/04/2012 1049   BUN 7.0 03/03/2012 0924   BUN 8 02/04/2012 1049   CREATININE 0.7 03/03/2012 0924   CREATININE 0.52 02/04/2012 1049      Component Value Date/Time   CALCIUM 9.0 03/03/2012 0924   CALCIUM 9.0 02/04/2012 1049   ALKPHOS 78 03/03/2012 0924   ALKPHOS 75 02/04/2012 1049   AST 23 03/03/2012 0924   AST 16 02/04/2012 1049   ALT 17 03/03/2012 0924   ALT 10 02/04/2012 1049   BILITOT 0.40 03/03/2012 0924   BILITOT 0.2* 02/04/2012 1049       Studies/Results: No results found.  Medications: I have reviewed the patient's current medications.  Assessment/Plan:  1. 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 Adjuvant  FOLFOX chemotherapy was initiated on 01/21/2012. 2. Delayed nausea following cycle 2 FOLFOX. Aloxi was added with cycle 3. Persistent delayed nausea. Emend was added with cycle 4. She noted improvement in the nausea following cycle 4. She declines steroid prophylaxis.  3. Tiny lung nodules seen on a CT of the chest 10/19/2011. 4. Ongoing tobacco use. 5. Endometrial thickening on CT of the abdomen 10/02/2011. 6. Port-A-Cath placement 01/09/2012. 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 8. Mild thrombocytopenia secondary to chemotherapy.  Disposition-Ms. Hunley appears stable. She has completed 4 cycles of adjuvant FOLFOX chemotherapy. Plan to proceed with cycle 5 today as scheduled. She will return for a followup visit and cycle 6 in 2 weeks. She will contact the office in he interim with any problems. We specifically discussed bruising/bleeding.  Plan reviewed with Dr. Truett Perna.  Lonna Cobb ANP/GNP-BC

## 2012-03-18 ENCOUNTER — Telehealth: Payer: Self-pay | Admitting: *Deleted

## 2012-03-18 NOTE — Progress Notes (Signed)
Ms. Astarita states she continues to eat okay.  She has had 3 diarrheal stools daily.  Her weight has decreased to 162.5 pounds September 23rd, from 164.6 pounds August 26th.  However, this is within a usual weight for her.  NUTRITION DIAGNOSIS:  Food and nutrition related knowledge deficit improved.  INTERVENTION:  I have educated the patient on various foods she could add to her diet to help with diarrhea. We have discussed foods that she should potentially avoid that could aggravate loose stools. I have encouraged her to take medications as recommended by her physician. I provided fact sheets and recipes for the patient to take with her today. Patient able to teach back foods to add to improve diarrhea.  MONITORING/EVALUATION/GOALS:  The patient has been tolerating oral intake to keep weight within her usual range.  NEXT VISIT:  Monday, October 21st, during chemotherapy.   ______________________________ Zenovia Jarred, RD, CSO, LDN Clinical Nutrition Specialist BN/MEDQ  D:  03/17/2012  T:  03/18/2012  Job:  [id number]

## 2012-03-18 NOTE — Telephone Encounter (Signed)
Left VM reporting she is having no diarrhea. Scheduled for pump d/c tomorrow.

## 2012-03-19 ENCOUNTER — Other Ambulatory Visit: Payer: Self-pay | Admitting: Oncology

## 2012-03-19 ENCOUNTER — Ambulatory Visit (HOSPITAL_BASED_OUTPATIENT_CLINIC_OR_DEPARTMENT_OTHER): Payer: BC Managed Care – PPO

## 2012-03-19 VITALS — BP 150/84 | HR 54 | Temp 98.0°F

## 2012-03-19 DIAGNOSIS — C801 Malignant (primary) neoplasm, unspecified: Secondary | ICD-10-CM

## 2012-03-19 DIAGNOSIS — Z5111 Encounter for antineoplastic chemotherapy: Secondary | ICD-10-CM

## 2012-03-19 DIAGNOSIS — C189 Malignant neoplasm of colon, unspecified: Secondary | ICD-10-CM

## 2012-03-19 MED ORDER — SODIUM CHLORIDE 0.9 % IV SOLN
2400.0000 mg/m2 | INTRAVENOUS | Status: DC
  Administered 2012-03-19: 4450 mg via INTRAVENOUS
  Filled 2012-03-19: qty 89

## 2012-03-19 NOTE — Progress Notes (Signed)
Patient denies any further diarrhea.

## 2012-03-19 NOTE — Patient Instructions (Signed)
Call MD for problems.  To return on Friday 03/21/12 @ 1145am for pump dc

## 2012-03-21 ENCOUNTER — Ambulatory Visit (HOSPITAL_BASED_OUTPATIENT_CLINIC_OR_DEPARTMENT_OTHER): Payer: BC Managed Care – PPO

## 2012-03-21 VITALS — BP 123/80 | HR 77 | Temp 98.1°F

## 2012-03-21 DIAGNOSIS — C801 Malignant (primary) neoplasm, unspecified: Secondary | ICD-10-CM

## 2012-03-21 DIAGNOSIS — C184 Malignant neoplasm of transverse colon: Secondary | ICD-10-CM

## 2012-03-21 MED ORDER — HEPARIN SOD (PORK) LOCK FLUSH 100 UNIT/ML IV SOLN
500.0000 [IU] | Freq: Once | INTRAVENOUS | Status: AC | PRN
  Administered 2012-03-21: 500 [IU]
  Filled 2012-03-21: qty 5

## 2012-03-21 MED ORDER — SODIUM CHLORIDE 0.9 % IJ SOLN
10.0000 mL | INTRAMUSCULAR | Status: DC | PRN
  Administered 2012-03-21: 10 mL
  Filled 2012-03-21: qty 10

## 2012-03-21 NOTE — Patient Instructions (Signed)
Call MD with any questions 

## 2012-03-25 ENCOUNTER — Encounter: Payer: Self-pay | Admitting: Oncology

## 2012-03-25 NOTE — Progress Notes (Signed)
Faxed disability form to [facility] @ [phone].

## 2012-03-30 ENCOUNTER — Other Ambulatory Visit: Payer: Self-pay | Admitting: Oncology

## 2012-03-31 ENCOUNTER — Ambulatory Visit (HOSPITAL_BASED_OUTPATIENT_CLINIC_OR_DEPARTMENT_OTHER): Payer: BC Managed Care – PPO | Admitting: Oncology

## 2012-03-31 ENCOUNTER — Telehealth: Payer: Self-pay | Admitting: *Deleted

## 2012-03-31 ENCOUNTER — Other Ambulatory Visit (HOSPITAL_BASED_OUTPATIENT_CLINIC_OR_DEPARTMENT_OTHER): Payer: BC Managed Care – PPO | Admitting: Lab

## 2012-03-31 ENCOUNTER — Telehealth: Payer: Self-pay | Admitting: Oncology

## 2012-03-31 ENCOUNTER — Ambulatory Visit (HOSPITAL_BASED_OUTPATIENT_CLINIC_OR_DEPARTMENT_OTHER): Payer: BC Managed Care – PPO

## 2012-03-31 VITALS — BP 171/89 | HR 68 | Temp 97.4°F | Resp 20 | Ht 65.0 in | Wt 162.0 lb

## 2012-03-31 DIAGNOSIS — C801 Malignant (primary) neoplasm, unspecified: Secondary | ICD-10-CM

## 2012-03-31 DIAGNOSIS — R110 Nausea: Secondary | ICD-10-CM

## 2012-03-31 DIAGNOSIS — F17200 Nicotine dependence, unspecified, uncomplicated: Secondary | ICD-10-CM

## 2012-03-31 DIAGNOSIS — Z5111 Encounter for antineoplastic chemotherapy: Secondary | ICD-10-CM

## 2012-03-31 DIAGNOSIS — C189 Malignant neoplasm of colon, unspecified: Secondary | ICD-10-CM

## 2012-03-31 DIAGNOSIS — R918 Other nonspecific abnormal finding of lung field: Secondary | ICD-10-CM

## 2012-03-31 DIAGNOSIS — G620 Drug-induced polyneuropathy: Secondary | ICD-10-CM

## 2012-03-31 DIAGNOSIS — D6959 Other secondary thrombocytopenia: Secondary | ICD-10-CM

## 2012-03-31 LAB — CBC WITH DIFFERENTIAL/PLATELET
BASO%: 0.5 % (ref 0.0–2.0)
Eosinophils Absolute: 0.1 10*3/uL (ref 0.0–0.5)
LYMPH%: 46.8 % (ref 14.0–49.7)
MCHC: 32.8 g/dL (ref 31.5–36.0)
MCV: 92.9 fL (ref 79.5–101.0)
MONO%: 15.7 % — ABNORMAL HIGH (ref 0.0–14.0)
NEUT#: 2.2 10*3/uL (ref 1.5–6.5)
RBC: 3.81 10*6/uL (ref 3.70–5.45)
RDW: 16 % — ABNORMAL HIGH (ref 11.2–14.5)
WBC: 6.2 10*3/uL (ref 3.9–10.3)
nRBC: 0 % (ref 0–0)

## 2012-03-31 LAB — COMPREHENSIVE METABOLIC PANEL (CC13)
ALT: 33 U/L (ref 0–55)
AST: 38 U/L — ABNORMAL HIGH (ref 5–34)
Alkaline Phosphatase: 94 U/L (ref 40–150)
CO2: 25 mEq/L (ref 22–29)
Creatinine: 0.6 mg/dL (ref 0.6–1.1)
Sodium: 139 mEq/L (ref 136–145)
Total Bilirubin: 0.3 mg/dL (ref 0.20–1.20)
Total Protein: 6.7 g/dL (ref 6.4–8.3)

## 2012-03-31 MED ORDER — DEXTROSE 5 % IV SOLN
Freq: Once | INTRAVENOUS | Status: AC
  Administered 2012-03-31: 13:00:00 via INTRAVENOUS

## 2012-03-31 MED ORDER — SODIUM CHLORIDE 0.9 % IJ SOLN
10.0000 mL | INTRAMUSCULAR | Status: DC | PRN
  Filled 2012-03-31: qty 10

## 2012-03-31 MED ORDER — LEUCOVORIN CALCIUM INJECTION 350 MG
400.0000 mg/m2 | Freq: Once | INTRAVENOUS | Status: AC
  Administered 2012-03-31: 740 mg via INTRAVENOUS
  Filled 2012-03-31: qty 37

## 2012-03-31 MED ORDER — FLUOROURACIL CHEMO INJECTION 2.5 GM/50ML
400.0000 mg/m2 | Freq: Once | INTRAVENOUS | Status: AC
  Administered 2012-03-31: 750 mg via INTRAVENOUS
  Filled 2012-03-31: qty 15

## 2012-03-31 MED ORDER — SODIUM CHLORIDE 0.9 % IV SOLN
2400.0000 mg/m2 | INTRAVENOUS | Status: DC
  Administered 2012-03-31: 4450 mg via INTRAVENOUS
  Filled 2012-03-31: qty 89

## 2012-03-31 NOTE — Telephone Encounter (Signed)
Per staff message and POF I have scheduled appt.  JMW  

## 2012-03-31 NOTE — Progress Notes (Signed)
   [facility]    OFFICE PROGRESS NOTE   INTERVAL HISTORY:   She completed another cycle of FOLFOX beginning on September 23rd. The day 1 5-fluorouracil was held until day 3 do to diarrhea. She had multiple loose stools on September 23. These resolved and she completed the infusional 5-fluorouracil beginning on 03/19/2012. She reports to loose stools over the past few weeks. She had cold sensitivity lasting 4-5 days following chemotherapy. No peripheral numbness today. She feels well.  Objective:  Vital signs in last 24 hours:  Blood pressure 171/89, pulse 68, temperature 97.4 F (36.3 C), temperature source Oral, resp. rate 20, height 5\' 5"  (1.651 m), weight 162 lb (73.483 kg).    HEENT: No thrush or ulcers Resp: Lungs clear bilaterally Cardio: Regular rate and rhythm GI: No hepatomegaly Vascular: No leg edema Neuro: The vibratory sense is intact at the fingertips bilaterally  Skin: Hyperpigmentation of the palms   Portacath/PICC-without erythema  Lab Results:  Lab Results  Component Value Date   WBC 6.2 03/31/2012   HGB 11.6 03/31/2012   HCT 35.4 03/31/2012   MCV 92.9 03/31/2012   PLT 81* 03/31/2012   ANC 2.2    Medications: I have reviewed the patient's current medications.  Assessment/Plan: 1. 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 Adjuvant FOLFOX chemotherapy was initiated on 01/21/2012. 2. Delayed nausea following cycle 2 FOLFOX. Aloxi was added with cycle 3. Persistent delayed nausea. Emend was added with cycle 4. She noted improvement in the nausea following cycle 4. She declined steroid prophylaxis.  3. Tiny lung nodules seen on a CT of the chest 10/19/2011. 4. Ongoing tobacco use. 5. Endometrial thickening on CT of the abdomen 10/02/2011. 6. Port-A-Cath placement 01/09/2012. 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 8. Mild thrombocytopenia  secondary to chemotherapy.   Disposition:  She has completed 5 cycles of adjuvant FOLFOX chemotherapy. She has tolerated chemotherapy well. Ms. Goecke has developed moderate thrombocytopenia. We decided to hold oxaliplatin with cycle 6 today. She will return for an office visit and chemotherapy in 2 weeks.   Thornton Papas, MD  03/31/2012  1:57 PM

## 2012-03-31 NOTE — Patient Instructions (Signed)
[facility] Discharge Instructions for Patients Receiving Chemotherapy  Today you received the following chemotherapy agents : Leucovorin, 5FU  To help prevent nausea and vomiting after your treatment, we encourage you to take your nausea medication as directed by your MD. If you develop nausea and vomiting that is not controlled by your nausea medication, call the clinic. If it is after clinic hours your family physician or the after hours number for the clinic or go to the Emergency Department.   BELOW ARE SYMPTOMS THAT SHOULD BE REPORTED IMMEDIATELY:  *FEVER GREATER THAN 100.5 F  *CHILLS WITH OR WITHOUT FEVER  NAUSEA AND VOMITING THAT IS NOT CONTROLLED WITH YOUR NAUSEA MEDICATION  *UNUSUAL SHORTNESS OF BREATH  *UNUSUAL BRUISING OR BLEEDING  TENDERNESS IN MOUTH AND THROAT WITH OR WITHOUT PRESENCE OF ULCERS  *URINARY PROBLEMS  *BOWEL PROBLEMS  UNUSUAL RASH Items with * indicate a potential emergency and should be followed up as soon as possible.   Feel free to call the clinic you have any questions or concerns. The clinic phone number is [phone].

## 2012-03-31 NOTE — Telephone Encounter (Signed)
Gave pt appt for October and November 2013 lab, chemo and MD °

## 2012-03-31 NOTE — Progress Notes (Signed)
Ok to treat pt today with plt count 81, verbal order received from Dr. Truett Perna and read back.

## 2012-04-02 ENCOUNTER — Ambulatory Visit (HOSPITAL_BASED_OUTPATIENT_CLINIC_OR_DEPARTMENT_OTHER): Payer: BC Managed Care – PPO

## 2012-04-02 VITALS — BP 145/82 | HR 77 | Temp 98.0°F | Resp 16

## 2012-04-02 DIAGNOSIS — C801 Malignant (primary) neoplasm, unspecified: Secondary | ICD-10-CM

## 2012-04-02 DIAGNOSIS — Z452 Encounter for adjustment and management of vascular access device: Secondary | ICD-10-CM

## 2012-04-02 DIAGNOSIS — C189 Malignant neoplasm of colon, unspecified: Secondary | ICD-10-CM

## 2012-04-02 MED ORDER — SODIUM CHLORIDE 0.9 % IJ SOLN
10.0000 mL | INTRAMUSCULAR | Status: DC | PRN
  Administered 2012-04-02: 10 mL
  Filled 2012-04-02: qty 10

## 2012-04-02 MED ORDER — HEPARIN SOD (PORK) LOCK FLUSH 100 UNIT/ML IV SOLN
500.0000 [IU] | Freq: Once | INTRAVENOUS | Status: AC | PRN
  Administered 2012-04-02: 500 [IU]
  Filled 2012-04-02: qty 5

## 2012-04-07 ENCOUNTER — Ambulatory Visit: Payer: BC Managed Care – PPO

## 2012-04-12 ENCOUNTER — Other Ambulatory Visit: Payer: Self-pay | Admitting: Oncology

## 2012-04-14 ENCOUNTER — Telehealth: Payer: Self-pay | Admitting: *Deleted

## 2012-04-14 ENCOUNTER — Ambulatory Visit (HOSPITAL_BASED_OUTPATIENT_CLINIC_OR_DEPARTMENT_OTHER): Payer: BC Managed Care – PPO | Admitting: Nurse Practitioner

## 2012-04-14 ENCOUNTER — Other Ambulatory Visit (HOSPITAL_BASED_OUTPATIENT_CLINIC_OR_DEPARTMENT_OTHER): Payer: BC Managed Care – PPO | Admitting: Lab

## 2012-04-14 ENCOUNTER — Ambulatory Visit (HOSPITAL_BASED_OUTPATIENT_CLINIC_OR_DEPARTMENT_OTHER): Payer: BC Managed Care – PPO

## 2012-04-14 ENCOUNTER — Telehealth: Payer: Self-pay | Admitting: Oncology

## 2012-04-14 ENCOUNTER — Ambulatory Visit: Payer: BC Managed Care – PPO | Admitting: Nutrition

## 2012-04-14 VITALS — BP 152/82 | HR 87 | Temp 97.5°F | Resp 20 | Ht 65.0 in | Wt 160.5 lb

## 2012-04-14 DIAGNOSIS — C189 Malignant neoplasm of colon, unspecified: Secondary | ICD-10-CM

## 2012-04-14 DIAGNOSIS — G620 Drug-induced polyneuropathy: Secondary | ICD-10-CM

## 2012-04-14 DIAGNOSIS — C801 Malignant (primary) neoplasm, unspecified: Secondary | ICD-10-CM

## 2012-04-14 DIAGNOSIS — Z5111 Encounter for antineoplastic chemotherapy: Secondary | ICD-10-CM

## 2012-04-14 LAB — COMPREHENSIVE METABOLIC PANEL (CC13)
Albumin: 3.7 g/dL (ref 3.5–5.0)
CO2: 24 mEq/L (ref 22–29)
Glucose: 107 mg/dl — ABNORMAL HIGH (ref 70–99)
Sodium: 142 mEq/L (ref 136–145)
Total Bilirubin: 0.3 mg/dL (ref 0.20–1.20)
Total Protein: 6.4 g/dL (ref 6.4–8.3)

## 2012-04-14 LAB — CBC WITH DIFFERENTIAL/PLATELET
Basophils Absolute: 0 10*3/uL (ref 0.0–0.1)
Eosinophils Absolute: 0.1 10*3/uL (ref 0.0–0.5)
HCT: 38 % (ref 34.8–46.6)
HGB: 12.4 g/dL (ref 11.6–15.9)
MCV: 93.4 fL (ref 79.5–101.0)
MONO%: 9.3 % (ref 0.0–14.0)
NEUT#: 2.9 10*3/uL (ref 1.5–6.5)
NEUT%: 52.5 % (ref 38.4–76.8)
Platelets: 146 10*3/uL (ref 145–400)
RDW: 15.4 % — ABNORMAL HIGH (ref 11.2–14.5)

## 2012-04-14 MED ORDER — SODIUM CHLORIDE 0.9 % IV SOLN
2400.0000 mg/m2 | INTRAVENOUS | Status: DC
  Administered 2012-04-14: 4450 mg via INTRAVENOUS
  Filled 2012-04-14: qty 89

## 2012-04-14 MED ORDER — SODIUM CHLORIDE 0.9 % IV SOLN
150.0000 mg | Freq: Once | INTRAVENOUS | Status: AC
  Administered 2012-04-14: 150 mg via INTRAVENOUS
  Filled 2012-04-14: qty 5

## 2012-04-14 MED ORDER — PALONOSETRON HCL INJECTION 0.25 MG/5ML
0.2500 mg | Freq: Once | INTRAVENOUS | Status: AC
  Administered 2012-04-14: 0.25 mg via INTRAVENOUS

## 2012-04-14 MED ORDER — FLUOROURACIL CHEMO INJECTION 2.5 GM/50ML
400.0000 mg/m2 | Freq: Once | INTRAVENOUS | Status: AC
  Administered 2012-04-14: 750 mg via INTRAVENOUS
  Filled 2012-04-14: qty 15

## 2012-04-14 MED ORDER — DEXAMETHASONE SODIUM PHOSPHATE 10 MG/ML IJ SOLN
10.0000 mg | Freq: Once | INTRAMUSCULAR | Status: AC
  Administered 2012-04-14: 10 mg via INTRAVENOUS

## 2012-04-14 MED ORDER — DEXTROSE 5 % IV SOLN
Freq: Once | INTRAVENOUS | Status: AC
  Administered 2012-04-14: 11:00:00 via INTRAVENOUS

## 2012-04-14 MED ORDER — LEUCOVORIN CALCIUM INJECTION 350 MG
400.0000 mg/m2 | Freq: Once | INTRAVENOUS | Status: AC
  Administered 2012-04-14: 740 mg via INTRAVENOUS
  Filled 2012-04-14: qty 37

## 2012-04-14 MED ORDER — OXALIPLATIN CHEMO INJECTION 100 MG/20ML
85.0000 mg/m2 | Freq: Once | INTRAVENOUS | Status: AC
  Administered 2012-04-14: 155 mg via INTRAVENOUS
  Filled 2012-04-14: qty 31

## 2012-04-14 MED ORDER — SODIUM CHLORIDE 0.9 % IJ SOLN
10.0000 mL | INTRAMUSCULAR | Status: DC | PRN
  Filled 2012-04-14: qty 10

## 2012-04-14 NOTE — Telephone Encounter (Signed)
Pr staff phone call and POF I have scheduled appts. JMW  

## 2012-04-14 NOTE — Patient Instructions (Addendum)
[facility] Discharge Instructions for Patients Receiving Chemotherapy  Today you received the following chemotherapy agents 5FU/LV/Oxaliplatin.  To help prevent nausea and vomiting after your treatment, we encourage you to take your nausea medication.  If you develop nausea and vomiting that is not controlled by your nausea medication, call the clinic. If it is after clinic hours your family physician or the after hours number for the clinic or go to the Emergency Department.   BELOW ARE SYMPTOMS THAT SHOULD BE REPORTED IMMEDIATELY:  *FEVER GREATER THAN 100.5 F  *CHILLS WITH OR WITHOUT FEVER  NAUSEA AND VOMITING THAT IS NOT CONTROLLED WITH YOUR NAUSEA MEDICATION  *UNUSUAL SHORTNESS OF BREATH  *UNUSUAL BRUISING OR BLEEDING  TENDERNESS IN MOUTH AND THROAT WITH OR WITHOUT PRESENCE OF ULCERS  *URINARY PROBLEMS  *BOWEL PROBLEMS  UNUSUAL RASH Items with * indicate a potential emergency and should be followed up as soon as possible.  Feel free to call the clinic you have any questions or concerns. The clinic phone number is [phone].

## 2012-04-14 NOTE — Progress Notes (Signed)
OFFICE PROGRESS NOTE  Interval history:  [name] returns as scheduled. She completed cycle 6 FOLFOX on 03/31/2012. Oxaliplatin was held with cycle 6 due to thrombocytopenia. She had no nausea or vomiting following the most recent cycle of chemotherapy. No diarrhea. No mouth sores. She denies numbness or tingling in her hands or feet.  This morning she had 2 loose stools and "regurgitated" the food she ate last night.   Objective: Blood pressure 152/82, pulse 87, temperature 97.5 F (36.4 C), resp. rate 20, height 5\' 5"  (1.651 m), weight 160 lb 8 oz (72.802 kg).  Oropharynx is without thrush or ulceration. Lungs are clear. Regular cardiac rhythm. Port-A-Cath site is without erythema. Abdomen is soft and nontender. No hepatomegaly. Extremities are without edema. Vibratory sense is mildly decreased over the fingertips per tuning fork exam. Hyperpigmentation of the palms. No erythema.  Lab Results: Lab Results  Component Value Date   WBC 5.6 04/14/2012   HGB 12.4 04/14/2012   HCT 38.0 04/14/2012   MCV 93.4 04/14/2012   PLT 146 04/14/2012    Chemistry:    Chemistry      Component Value Date/Time   NA 139 03/31/2012 1036   NA 139 02/04/2012 1049   K 3.3* 03/31/2012 1036   K 3.4* 02/04/2012 1049   CL 105 03/31/2012 1036   CL 104 02/04/2012 1049   CO2 25 03/31/2012 1036   CO2 27 02/04/2012 1049   BUN 11.0 03/31/2012 1036   BUN 8 02/04/2012 1049   CREATININE 0.6 03/31/2012 1036   CREATININE 0.52 02/04/2012 1049      Component Value Date/Time   CALCIUM 9.7 03/31/2012 1036   CALCIUM 9.0 02/04/2012 1049   ALKPHOS 94 03/31/2012 1036   ALKPHOS 75 02/04/2012 1049   AST 38* 03/31/2012 1036   AST 16 02/04/2012 1049   ALT 33 03/31/2012 1036   ALT 10 02/04/2012 1049   BILITOT 0.30 03/31/2012 1036   BILITOT 0.2* 02/04/2012 1049       Studies/Results: No results found.  Medications: I have reviewed the patient's current medications.  Assessment/Plan:  1. Stage IIIa (T2 N1) adenocarcinoma of the  distal transverse colon/splenic flexure status post partial colectomy 12/18/2011. Adjuvant FOLFOX chemotherapy was initiated on 01/21/2012. 2. Delayed nausea following cycle 2 FOLFOX. Aloxi was added with cycle 3. Persistent delayed nausea. Emend was added with cycle 4. She noted improvement in the nausea following cycle 4. She declined steroid prophylaxis.  3. Tiny lung nodules seen on a CT of the chest 10/19/2011. 4. Ongoing tobacco use. 5. Endometrial thickening on CT of the abdomen 10/02/2011. 6. Port-A-Cath placement 01/09/2012. 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 She has mild decrease in vibratory sense at the fingertips on exam today. 8. Mild thrombocytopenia secondary to chemotherapy. Oxaliplatin was held with cycle 6. The platelet count is better today.  Disposition-Ms. Muecke appears stable. She has completed 6 cycles of adjuvant FOLFOX chemotherapy. Oxaliplatin was held with cycle 6 due to thrombocytopenia. Plan to proceed with cycle 7 today as scheduled. Oxaliplatin will be resumed with today's treatment. She will return for a followup visit and cycle 8 in 2 weeks.  She had some loose stools and vomited earlier this morning. It is unlikely the symptoms are related to the chemotherapy she received 2 weeks ago. At present she feels well. She will contact the office if the symptoms persist.  Plan reviewed with Dr. Truett Perna.    [name] ANP/GNP-BC

## 2012-04-14 NOTE — Progress Notes (Signed)
Patient's weight has declined to 160 pounds on October 21. This is approximately a 2 pound weight loss since September 23. She denies problems with nausea, vomiting, diarrhea, and mouth sores after treatment. She did have one episode of vomiting after she laid down after she ate a fatty meal. She continues to complain of taste alterations. She understands she is not allowed to drink cold beverages or foods directly after her oxaliplaten.  Nutrition diagnosis: Food and nutrition related knowledge deficit continues but has improved.  Intervention: Patient was educated to continue small amounts of bland, high-calorie, high-protein foods throughout the day. I have provided her with fact sheets on warm protein recipes that she might consume directly after treatment. These are high in calories and should help her to maintain her weight. I educated her on strategies for dealing with nausea. Patient able to verbalize and teach back strategies for increasing calories and protein utilizing bland foods.  Monitoring, evaluation, goals: The patient is tolerating oral intake but has had a 2 pound weight loss.  Next visit: Monday, November 4, during chemotherapy.

## 2012-04-14 NOTE — Telephone Encounter (Signed)
Appts made and printed for pt  aom 

## 2012-04-16 ENCOUNTER — Ambulatory Visit (HOSPITAL_BASED_OUTPATIENT_CLINIC_OR_DEPARTMENT_OTHER): Payer: BC Managed Care – PPO

## 2012-04-16 VITALS — BP 134/64 | HR 65 | Temp 98.0°F

## 2012-04-16 DIAGNOSIS — Z452 Encounter for adjustment and management of vascular access device: Secondary | ICD-10-CM

## 2012-04-16 DIAGNOSIS — C189 Malignant neoplasm of colon, unspecified: Secondary | ICD-10-CM

## 2012-04-16 DIAGNOSIS — C801 Malignant (primary) neoplasm, unspecified: Secondary | ICD-10-CM

## 2012-04-16 MED ORDER — HEPARIN SOD (PORK) LOCK FLUSH 100 UNIT/ML IV SOLN
500.0000 [IU] | Freq: Once | INTRAVENOUS | Status: AC | PRN
  Administered 2012-04-16: 500 [IU]
  Filled 2012-04-16: qty 5

## 2012-04-16 MED ORDER — SODIUM CHLORIDE 0.9 % IJ SOLN
10.0000 mL | INTRAMUSCULAR | Status: DC | PRN
  Administered 2012-04-16: 10 mL
  Filled 2012-04-16: qty 10

## 2012-04-27 ENCOUNTER — Other Ambulatory Visit: Payer: Self-pay | Admitting: Oncology

## 2012-04-28 ENCOUNTER — Ambulatory Visit (HOSPITAL_BASED_OUTPATIENT_CLINIC_OR_DEPARTMENT_OTHER): Payer: BC Managed Care – PPO

## 2012-04-28 ENCOUNTER — Ambulatory Visit: Payer: BC Managed Care – PPO | Admitting: Nutrition

## 2012-04-28 ENCOUNTER — Ambulatory Visit (HOSPITAL_BASED_OUTPATIENT_CLINIC_OR_DEPARTMENT_OTHER): Payer: BC Managed Care – PPO | Admitting: Nurse Practitioner

## 2012-04-28 ENCOUNTER — Other Ambulatory Visit (HOSPITAL_BASED_OUTPATIENT_CLINIC_OR_DEPARTMENT_OTHER): Payer: BC Managed Care – PPO | Admitting: Lab

## 2012-04-28 ENCOUNTER — Telehealth: Payer: Self-pay | Admitting: Oncology

## 2012-04-28 VITALS — BP 177/83 | HR 73 | Temp 97.5°F | Resp 20 | Ht 65.0 in | Wt 160.4 lb

## 2012-04-28 DIAGNOSIS — D6959 Other secondary thrombocytopenia: Secondary | ICD-10-CM

## 2012-04-28 DIAGNOSIS — Z5111 Encounter for antineoplastic chemotherapy: Secondary | ICD-10-CM

## 2012-04-28 DIAGNOSIS — C185 Malignant neoplasm of splenic flexure: Secondary | ICD-10-CM

## 2012-04-28 DIAGNOSIS — G622 Polyneuropathy due to other toxic agents: Secondary | ICD-10-CM

## 2012-04-28 DIAGNOSIS — C801 Malignant (primary) neoplasm, unspecified: Secondary | ICD-10-CM

## 2012-04-28 DIAGNOSIS — D702 Other drug-induced agranulocytosis: Secondary | ICD-10-CM

## 2012-04-28 LAB — COMPREHENSIVE METABOLIC PANEL (CC13)
ALT: 30 U/L (ref 0–55)
AST: 41 U/L — ABNORMAL HIGH (ref 5–34)
CO2: 26 mEq/L (ref 22–29)
Chloride: 108 mEq/L — ABNORMAL HIGH (ref 98–107)
Sodium: 140 mEq/L (ref 136–145)
Total Bilirubin: 0.26 mg/dL (ref 0.20–1.20)
Total Protein: 6.9 g/dL (ref 6.4–8.3)

## 2012-04-28 LAB — CBC WITH DIFFERENTIAL/PLATELET
BASO%: 0.5 % (ref 0.0–2.0)
Eosinophils Absolute: 0.1 10*3/uL (ref 0.0–0.5)
MONO#: 0.5 10*3/uL (ref 0.1–0.9)
MONO%: 11.9 % (ref 0.0–14.0)
NEUT#: 1 10*3/uL — ABNORMAL LOW (ref 1.5–6.5)
RBC: 4.02 10*6/uL (ref 3.70–5.45)
RDW: 14.8 % — ABNORMAL HIGH (ref 11.2–14.5)
WBC: 4.3 10*3/uL (ref 3.9–10.3)
nRBC: 0 % (ref 0–0)

## 2012-04-28 MED ORDER — SODIUM CHLORIDE 0.9 % IV SOLN
150.0000 mg | Freq: Once | INTRAVENOUS | Status: AC
  Administered 2012-04-28: 150 mg via INTRAVENOUS
  Filled 2012-04-28: qty 5

## 2012-04-28 MED ORDER — DEXAMETHASONE SODIUM PHOSPHATE 10 MG/ML IJ SOLN
10.0000 mg | Freq: Once | INTRAMUSCULAR | Status: AC
  Administered 2012-04-28: 10 mg via INTRAVENOUS

## 2012-04-28 MED ORDER — LEUCOVORIN CALCIUM INJECTION 350 MG
400.0000 mg/m2 | Freq: Once | INTRAVENOUS | Status: AC
  Administered 2012-04-28: 740 mg via INTRAVENOUS
  Filled 2012-04-28: qty 37

## 2012-04-28 MED ORDER — OXALIPLATIN CHEMO INJECTION 100 MG/20ML
85.0000 mg/m2 | Freq: Once | INTRAVENOUS | Status: AC
  Administered 2012-04-28: 155 mg via INTRAVENOUS
  Filled 2012-04-28: qty 31

## 2012-04-28 MED ORDER — PALONOSETRON HCL INJECTION 0.25 MG/5ML
0.2500 mg | Freq: Once | INTRAVENOUS | Status: AC
  Administered 2012-04-28: 0.25 mg via INTRAVENOUS

## 2012-04-28 MED ORDER — FLUOROURACIL CHEMO INJECTION 2.5 GM/50ML
400.0000 mg/m2 | Freq: Once | INTRAVENOUS | Status: AC
  Administered 2012-04-28: 750 mg via INTRAVENOUS
  Filled 2012-04-28: qty 15

## 2012-04-28 MED ORDER — SODIUM CHLORIDE 0.9 % IV SOLN
2400.0000 mg/m2 | INTRAVENOUS | Status: DC
  Administered 2012-04-28: 4450 mg via INTRAVENOUS
  Filled 2012-04-28: qty 89

## 2012-04-28 MED ORDER — DEXTROSE 5 % IV SOLN
Freq: Once | INTRAVENOUS | Status: AC
  Administered 2012-04-28: 11:00:00 via INTRAVENOUS

## 2012-04-28 NOTE — Patient Instructions (Addendum)
[facility] Discharge Instructions for Patients Receiving Chemotherapy  Today you received the following chemotherapy agents Oxaliplatin, 5FU, Leucovorin  To help prevent nausea and vomiting after your treatment, we encourage you to take your nausea medication as directed by your MD. If you develop nausea and vomiting that is not controlled by your nausea medication, call the clinic. If it is after clinic hours your family physician or the after hours number for the clinic or go to the Emergency Department.   BELOW ARE SYMPTOMS THAT SHOULD BE REPORTED IMMEDIATELY:  *FEVER GREATER THAN 100.5 F  *CHILLS WITH OR WITHOUT FEVER  NAUSEA AND VOMITING THAT IS NOT CONTROLLED WITH YOUR NAUSEA MEDICATION  *UNUSUAL SHORTNESS OF BREATH  *UNUSUAL BRUISING OR BLEEDING  TENDERNESS IN MOUTH AND THROAT WITH OR WITHOUT PRESENCE OF ULCERS  *URINARY PROBLEMS  *BOWEL PROBLEMS  UNUSUAL RASH Items with * indicate a potential emergency and should be followed up as soon as possible.   Feel free to call the clinic you have any questions or concerns. The clinic phone number is [phone].    

## 2012-04-28 NOTE — Telephone Encounter (Signed)
e-mailed Marcelino Duster to add folfox pt has Nov schedule will pick up Dec schedule nxt visit.

## 2012-04-28 NOTE — Progress Notes (Signed)
OFFICE PROGRESS NOTE  Interval history:  [name] returns as scheduled. She completed cycle 7 FOLFOX 04/14/2012. She denies nausea/vomiting. No mouth sores. No diarrhea. No numbness or tingling in her hands or feet. She noted "abdominal cramping" for 2 days following the chemotherapy.   Objective: Blood pressure 177/83, pulse 73, temperature 97.5 F (36.4 C), temperature source Oral, resp. rate 20, height 5\' 5"  (1.651 m), weight 160 lb 6.4 oz (72.757 kg).  Oropharynx is without thrush or ulceration. Lungs are clear. Regular cardiac rhythm. Port-A-Cath site is without erythema. Abdomen is soft and nontender. No hepatomegaly. Extremities are without edema. Calves are soft and nontender. Vibratory sense is mildly to moderately decreased over the fingertips per tuning fork exam.  Lab Results: Lab Results  Component Value Date   WBC 4.3 04/28/2012   HGB 12.1 04/28/2012   HCT 36.7 04/28/2012   MCV 91.3 04/28/2012   PLT 114* 04/28/2012    Chemistry:    Chemistry      Component Value Date/Time   NA 142 04/14/2012 0907   NA 139 02/04/2012 1049   K 3.5 04/14/2012 0907   K 3.4* 02/04/2012 1049   CL 110* 04/14/2012 0907   CL 104 02/04/2012 1049   CO2 24 04/14/2012 0907   CO2 27 02/04/2012 1049   BUN 11.0 04/14/2012 0907   BUN 8 02/04/2012 1049   CREATININE 0.6 04/14/2012 0907   CREATININE 0.52 02/04/2012 1049      Component Value Date/Time   CALCIUM 9.5 04/14/2012 0907   CALCIUM 9.0 02/04/2012 1049   ALKPHOS 90 04/14/2012 0907   ALKPHOS 75 02/04/2012 1049   AST 36* 04/14/2012 0907   AST 16 02/04/2012 1049   ALT 29 04/14/2012 0907   ALT 10 02/04/2012 1049   BILITOT 0.30 04/14/2012 0907   BILITOT 0.2* 02/04/2012 1049       Studies/Results: No results found.  Medications: I have reviewed the patient's current medications.  Assessment/Plan:  1. 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 Adjuvant FOLFOX  chemotherapy was initiated on 01/21/2012. 2. Delayed nausea following cycle 2 FOLFOX. Aloxi was added with cycle 3. Persistent delayed nausea. Emend was added with cycle 4. She noted improvement in the nausea following cycle 4. She declined steroid prophylaxis.  3. Tiny lung nodules seen on a CT of the chest 10/19/2011. 4. Ongoing tobacco use. 5. Endometrial thickening on CT of the abdomen 10/02/2011. 6. Port-A-Cath placement 01/09/2012. 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 She has mild to moderate decrease in vibratory sense at the fingertips on exam today. 8. Mild thrombocytopenia secondary to chemotherapy. Oxaliplatin was held with cycle 6.  9. Neutropenia secondary to chemotherapy.  Disposition-Ms. Ludvigsen appears stable. She has completed 7 cycles of adjuvant FOLFOX chemotherapy. She is  neutropenic on labs today. Plan to proceed with cycle 8 FOLFOX today as scheduled with Neulasta on the day of pump discontinuation. She will return for a followup visit and cycle 9 in 2 weeks. She will contact the office in the interim with any problems. We specifically discussed fever, chills, other signs of infection.  Plan reviewed with Dr. Truett Perna.  Lonna Cobb ANP/GNP-BC

## 2012-04-28 NOTE — Progress Notes (Signed)
Patient denies problems with oral intake. She denies nausea and vomiting. She voices no nutrition concerns.  Nutrition diagnosis: Food and nutrition related knowledge deficit has improved.  Intervention: I enforced the importance of continuing small frequent higher calorie, higher protein meals throughout the day. Patient to add oral nutrition supplements as needed.  Monitoring, evaluation, goals: The patient is tolerating oral intake.  Next visit: Monday, November 18, during chemotherapy.

## 2012-04-29 ENCOUNTER — Encounter: Payer: Self-pay | Admitting: Oncology

## 2012-04-29 NOTE — Progress Notes (Signed)
Faxed disability form to [facility] [place] @ [employer] [phone].

## 2012-04-30 ENCOUNTER — Ambulatory Visit: Payer: BC Managed Care – PPO

## 2012-04-30 ENCOUNTER — Ambulatory Visit (HOSPITAL_BASED_OUTPATIENT_CLINIC_OR_DEPARTMENT_OTHER): Payer: BC Managed Care – PPO

## 2012-04-30 VITALS — BP 152/80 | HR 60 | Temp 98.3°F

## 2012-04-30 DIAGNOSIS — Z5189 Encounter for other specified aftercare: Secondary | ICD-10-CM

## 2012-04-30 DIAGNOSIS — C801 Malignant (primary) neoplasm, unspecified: Secondary | ICD-10-CM

## 2012-04-30 DIAGNOSIS — C185 Malignant neoplasm of splenic flexure: Secondary | ICD-10-CM

## 2012-04-30 MED ORDER — SODIUM CHLORIDE 0.9 % IJ SOLN
10.0000 mL | INTRAMUSCULAR | Status: DC | PRN
  Administered 2012-04-30: 10 mL
  Filled 2012-04-30: qty 10

## 2012-04-30 MED ORDER — PEGFILGRASTIM INJECTION 6 MG/0.6ML
6.0000 mg | Freq: Once | SUBCUTANEOUS | Status: AC
  Administered 2012-04-30: 6 mg via SUBCUTANEOUS
  Filled 2012-04-30: qty 0.6

## 2012-04-30 MED ORDER — HEPARIN SOD (PORK) LOCK FLUSH 100 UNIT/ML IV SOLN
500.0000 [IU] | Freq: Once | INTRAVENOUS | Status: AC | PRN
  Administered 2012-04-30: 500 [IU]
  Filled 2012-04-30: qty 5

## 2012-04-30 NOTE — Patient Instructions (Signed)
Call MD for problems 

## 2012-05-01 ENCOUNTER — Telehealth: Payer: Self-pay | Admitting: Nurse Practitioner

## 2012-05-01 ENCOUNTER — Other Ambulatory Visit: Payer: Self-pay | Admitting: Nurse Practitioner

## 2012-05-01 DIAGNOSIS — E876 Hypokalemia: Secondary | ICD-10-CM

## 2012-05-01 MED ORDER — POTASSIUM CHLORIDE CRYS ER 20 MEQ PO TBCR: EXTENDED_RELEASE_TABLET | ORAL | Status: DC

## 2012-05-01 NOTE — Telephone Encounter (Signed)
I notified [name] of the low potassium level on 04/28/2012. A prescription was sent to her pharmacy for K-Dur 20 milliequivalents twice a day for 2 days then 20 mEq once daily. We will obtain a repeat potassium level when she returns on 05/12/2012.

## 2012-05-11 ENCOUNTER — Other Ambulatory Visit: Payer: Self-pay | Admitting: Oncology

## 2012-05-12 ENCOUNTER — Ambulatory Visit: Payer: BC Managed Care – PPO | Admitting: Nutrition

## 2012-05-12 ENCOUNTER — Ambulatory Visit (HOSPITAL_BASED_OUTPATIENT_CLINIC_OR_DEPARTMENT_OTHER): Payer: BC Managed Care – PPO | Admitting: Nurse Practitioner

## 2012-05-12 ENCOUNTER — Telehealth: Payer: Self-pay | Admitting: Oncology

## 2012-05-12 ENCOUNTER — Telehealth: Payer: Self-pay | Admitting: *Deleted

## 2012-05-12 ENCOUNTER — Other Ambulatory Visit (HOSPITAL_BASED_OUTPATIENT_CLINIC_OR_DEPARTMENT_OTHER): Payer: BC Managed Care – PPO | Admitting: Lab

## 2012-05-12 ENCOUNTER — Ambulatory Visit (HOSPITAL_BASED_OUTPATIENT_CLINIC_OR_DEPARTMENT_OTHER): Payer: BC Managed Care – PPO

## 2012-05-12 VITALS — BP 158/81 | HR 74 | Temp 97.1°F | Resp 18 | Ht 65.0 in | Wt 158.0 lb

## 2012-05-12 DIAGNOSIS — C801 Malignant (primary) neoplasm, unspecified: Secondary | ICD-10-CM

## 2012-05-12 DIAGNOSIS — Z5111 Encounter for antineoplastic chemotherapy: Secondary | ICD-10-CM

## 2012-05-12 DIAGNOSIS — C185 Malignant neoplasm of splenic flexure: Secondary | ICD-10-CM

## 2012-05-12 DIAGNOSIS — D696 Thrombocytopenia, unspecified: Secondary | ICD-10-CM

## 2012-05-12 DIAGNOSIS — C189 Malignant neoplasm of colon, unspecified: Secondary | ICD-10-CM

## 2012-05-12 LAB — COMPREHENSIVE METABOLIC PANEL (CC13)
ALT: 31 U/L (ref 0–55)
AST: 42 U/L — ABNORMAL HIGH (ref 5–34)
Alkaline Phosphatase: 119 U/L (ref 40–150)
Calcium: 9.8 mg/dL (ref 8.4–10.4)
Chloride: 108 mEq/L — ABNORMAL HIGH (ref 98–107)
Creatinine: 0.7 mg/dL (ref 0.6–1.1)
Total Bilirubin: 0.35 mg/dL (ref 0.20–1.20)

## 2012-05-12 LAB — CBC WITH DIFFERENTIAL/PLATELET
BASO%: 0.6 % (ref 0.0–2.0)
EOS%: 1 % (ref 0.0–7.0)
LYMPH%: 30.6 % (ref 14.0–49.7)
MCH: 30 pg (ref 25.1–34.0)
MCHC: 32.2 g/dL (ref 31.5–36.0)
MONO#: 1.2 10*3/uL — ABNORMAL HIGH (ref 0.1–0.9)
Platelets: 80 10*3/uL — ABNORMAL LOW (ref 145–400)
RBC: 3.9 10*6/uL (ref 3.70–5.45)
WBC: 10.6 10*3/uL — ABNORMAL HIGH (ref 3.9–10.3)
lymph#: 3.3 10*3/uL (ref 0.9–3.3)
nRBC: 1 % — ABNORMAL HIGH (ref 0–0)

## 2012-05-12 MED ORDER — LEUCOVORIN CALCIUM INJECTION 350 MG
400.0000 mg/m2 | Freq: Once | INTRAVENOUS | Status: AC
  Administered 2012-05-12: 740 mg via INTRAVENOUS
  Filled 2012-05-12: qty 37

## 2012-05-12 MED ORDER — SODIUM CHLORIDE 0.9 % IV SOLN
2400.0000 mg/m2 | INTRAVENOUS | Status: DC
  Administered 2012-05-12: 4450 mg via INTRAVENOUS
  Filled 2012-05-12: qty 89

## 2012-05-12 MED ORDER — FLUOROURACIL CHEMO INJECTION 2.5 GM/50ML
400.0000 mg/m2 | Freq: Once | INTRAVENOUS | Status: AC
  Administered 2012-05-12: 750 mg via INTRAVENOUS
  Filled 2012-05-12: qty 15

## 2012-05-12 NOTE — Progress Notes (Signed)
OFFICE PROGRESS NOTE  Interval history:   [name] returns as scheduled. She completed cycle 8 FOLFOX 04/28/2012 with Neulasta support. She denies nausea/vomiting. No mouth sores. No diarrhea. She noted cold sensitivity lasting approximately 7 days. No numbness or tingling in the absence of cold exposure. Good appetite. No fever or cough. She had mild bone pain following the Neulasta injection.   Objective: Blood pressure 158/81, pulse 74, temperature 97.1 F (36.2 C), temperature source Oral, resp. rate 18, height 5\' 5"  (1.651 m), weight 158 lb (71.668 kg).  Oropharynx is without thrush or ulceration. Lungs are clear. Regular cardiac rhythm. Port-A-Cath site is without erythema. Abdomen is soft and nontender. No hepatomegaly. Extremities are without edema. Calves are soft and nontender. Vibratory sense is mildly to moderately decreased over the fingertips per tuning fork exam.   Lab Results: Lab Results  Component Value Date   WBC 10.6* 05/12/2012   HGB 11.7 05/12/2012   HCT 36.3 05/12/2012   MCV 93.1 05/12/2012   PLT 80* 05/12/2012    Chemistry:    Chemistry      Component Value Date/Time   NA 140 04/28/2012 0838   NA 139 02/04/2012 1049   K 3.0* 04/28/2012 0838   K 3.4* 02/04/2012 1049   CL 108* 04/28/2012 0838   CL 104 02/04/2012 1049   CO2 26 04/28/2012 0838   CO2 27 02/04/2012 1049   BUN 5.0* 04/28/2012 0838   BUN 8 02/04/2012 1049   CREATININE 0.6 04/28/2012 0838   CREATININE 0.52 02/04/2012 1049      Component Value Date/Time   CALCIUM 9.8 04/28/2012 0838   CALCIUM 9.0 02/04/2012 1049   ALKPHOS 94 04/28/2012 0838   ALKPHOS 75 02/04/2012 1049   AST 41* 04/28/2012 0838   AST 16 02/04/2012 1049   ALT 30 04/28/2012 0838   ALT 10 02/04/2012 1049   BILITOT 0.26 04/28/2012 0838   BILITOT 0.2* 02/04/2012 1049       Studies/Results: No results found.  Medications: I have reviewed the patient's current medications.  Assessment/Plan:  1. Stage IIIa (T2 N1) adenocarcinoma of  the distal transverse colon/splenic flexure status post partial colectomy 12/18/2011. Adjuvant FOLFOX chemotherapy was initiated on 01/21/2012. 2. Delayed nausea following cycle 2 FOLFOX. Aloxi was added with cycle 3. Persistent delayed nausea. Emend was added with cycle 4. She noted improvement in the nausea following cycle 4. She declined steroid prophylaxis.  3. Tiny lung nodules seen on a CT of the chest 10/19/2011. 4. Ongoing tobacco use. 5. Endometrial thickening on CT of the abdomen 10/02/2011. 6. Port-A-Cath placement 01/09/2012. 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 She has mild to moderate decrease in vibratory sense at the fingertips on exam today. 8. Mild thrombocytopenia secondary to chemotherapy. Oxaliplatin was held with cycle 6 and will be held today.  9. Neutropenia secondary to chemotherapy 04/28/2012. She received Neulasta support with cycle 8. She will not receive Neulasta support with cycle 9. 10. Hypokalemia on labs 04/28/2012. She is taking a potassium supplement. Chemistry level today is pending.   Disposition-[name] appears stable. She has completed 8 cycles of adjuvant FOLFOX chemotherapy. She is thrombocytopenic on labs today. Plan to proceed with cycle 9 FOLFOX today as scheduled with Oxaliplatin to be held due to the thrombocytopenia. She will return for a followup visit and cycle 10 in 2 weeks. She will contact the office in the interim with any problems. We specifically discussed bleeding.  Plan reviewed with Dr. Truett Perna.   Lonna Cobb ANP/GNP-BC

## 2012-05-12 NOTE — Patient Instructions (Addendum)
[facility] Discharge Instructions for Patients Receiving Chemotherapy  Today you received the following chemotherapy agents 5FU, Leucovorin  To help prevent nausea and vomiting after your treatment, we encourage you to take your nausea medication    If you develop nausea and vomiting that is not controlled by your nausea medication, call the clinic.   BELOW ARE SYMPTOMS THAT SHOULD BE REPORTED IMMEDIATELY:  *FEVER GREATER THAN 100.5 F  *CHILLS WITH OR WITHOUT FEVER  NAUSEA AND VOMITING THAT IS NOT CONTROLLED WITH YOUR NAUSEA MEDICATION  *UNUSUAL SHORTNESS OF BREATH  *UNUSUAL BRUISING OR BLEEDING  TENDERNESS IN MOUTH AND THROAT WITH OR WITHOUT PRESENCE OF ULCERS  *URINARY PROBLEMS  *BOWEL PROBLEMS  UNUSUAL RASH Items with * indicate a potential emergency and should be followed up as soon as possible.  Feel free to call the clinic you have any questions or concerns. The clinic phone number is [phone].    

## 2012-05-12 NOTE — Telephone Encounter (Signed)
Per staff phone call and POF I have scheduled appts. JMW  

## 2012-05-12 NOTE — Telephone Encounter (Signed)
Gave pt appt calendar for November and December 2013 lab ,MD and chemo

## 2012-05-12 NOTE — Progress Notes (Signed)
Patient reports she has lost 2 pounds.  Her weight was documented as 158 pounds November 18 from 160.4 pounds November 4. Patient reports that she eats all day long. She has no idea why she might have lost weight. She does complain of taste alterations and poor appetite. She also states her potassium levels have been low.  Nutrition diagnosis: Food and nutrition related knowledge deficit improved.  Intervention: I have educated patient on high potassium foods she could incorporate into her meals and snacks. I've encouraged her to consume higher protein foods with meals and snacks to promote weight stabilization.  Monitoring, evaluation, goals: The patient is tolerating oral intake however her weight continues to trend down. She will work to increase oral intake to promote weight stabilization.  Next visit: Monday, December 2, during chemotherapy.

## 2012-05-14 ENCOUNTER — Ambulatory Visit (HOSPITAL_BASED_OUTPATIENT_CLINIC_OR_DEPARTMENT_OTHER): Payer: BC Managed Care – PPO

## 2012-05-14 ENCOUNTER — Telehealth: Payer: Self-pay | Admitting: *Deleted

## 2012-05-14 VITALS — BP 153/75 | HR 74 | Temp 98.1°F

## 2012-05-14 DIAGNOSIS — C185 Malignant neoplasm of splenic flexure: Secondary | ICD-10-CM

## 2012-05-14 DIAGNOSIS — C801 Malignant (primary) neoplasm, unspecified: Secondary | ICD-10-CM

## 2012-05-14 DIAGNOSIS — E876 Hypokalemia: Secondary | ICD-10-CM

## 2012-05-14 MED ORDER — SODIUM CHLORIDE 0.9 % IJ SOLN
10.0000 mL | INTRAMUSCULAR | Status: DC | PRN
  Administered 2012-05-14: 10 mL
  Filled 2012-05-14: qty 10

## 2012-05-14 MED ORDER — HEPARIN SOD (PORK) LOCK FLUSH 100 UNIT/ML IV SOLN
500.0000 [IU] | Freq: Once | INTRAVENOUS | Status: AC | PRN
  Administered 2012-05-14: 500 [IU]
  Filled 2012-05-14: qty 5

## 2012-05-14 MED ORDER — POTASSIUM CHLORIDE CRYS ER 20 MEQ PO TBCR: EXTENDED_RELEASE_TABLET | ORAL | Status: DC

## 2012-05-14 NOTE — Telephone Encounter (Signed)
Notified patient of K+ result. Confirmed she is taking daily K+ and she will continue. Recheck 05/26/12 as scheduled. Will order refills.

## 2012-05-14 NOTE — Telephone Encounter (Signed)
Message copied by Wandalee Ferdinand on Wed May 14, 2012 11:31 AM ------      Message from: Barnard, Virginia K      Created: Tue May 13, 2012  2:36 PM       Please call and have her continue daily potassium.       ----- Message -----         From: [facility] Interface         Sent: 05/12/2012   9:51 AM           To: Rana Snare, NP

## 2012-05-14 NOTE — Patient Instructions (Signed)
Call MD with any questions 

## 2012-05-24 ENCOUNTER — Other Ambulatory Visit: Payer: Self-pay | Admitting: Oncology

## 2012-05-25 DIAGNOSIS — I82409 Acute embolism and thrombosis of unspecified deep veins of unspecified lower extremity: Secondary | ICD-10-CM

## 2012-05-25 HISTORY — DX: Acute embolism and thrombosis of unspecified deep veins of unspecified lower extremity: I82.409

## 2012-05-26 ENCOUNTER — Telehealth: Payer: Self-pay | Admitting: *Deleted

## 2012-05-26 ENCOUNTER — Telehealth: Payer: Self-pay | Admitting: Oncology

## 2012-05-26 ENCOUNTER — Ambulatory Visit: Payer: BC Managed Care – PPO | Admitting: Nutrition

## 2012-05-26 ENCOUNTER — Ambulatory Visit: Payer: BC Managed Care – PPO | Admitting: Oncology

## 2012-05-26 ENCOUNTER — Ambulatory Visit (HOSPITAL_BASED_OUTPATIENT_CLINIC_OR_DEPARTMENT_OTHER): Payer: BC Managed Care – PPO

## 2012-05-26 ENCOUNTER — Other Ambulatory Visit (HOSPITAL_BASED_OUTPATIENT_CLINIC_OR_DEPARTMENT_OTHER): Payer: BC Managed Care – PPO | Admitting: Lab

## 2012-05-26 VITALS — BP 153/88 | HR 85 | Temp 97.0°F | Resp 18 | Ht 65.0 in | Wt 160.3 lb

## 2012-05-26 DIAGNOSIS — C189 Malignant neoplasm of colon, unspecified: Secondary | ICD-10-CM

## 2012-05-26 DIAGNOSIS — C801 Malignant (primary) neoplasm, unspecified: Secondary | ICD-10-CM

## 2012-05-26 DIAGNOSIS — Z5111 Encounter for antineoplastic chemotherapy: Secondary | ICD-10-CM

## 2012-05-26 LAB — COMPREHENSIVE METABOLIC PANEL (CC13)
BUN: 7 mg/dL (ref 7.0–26.0)
CO2: 24 mEq/L (ref 22–29)
Calcium: 9.3 mg/dL (ref 8.4–10.4)
Chloride: 108 mEq/L — ABNORMAL HIGH (ref 98–107)
Creatinine: 0.6 mg/dL (ref 0.6–1.1)
Glucose: 102 mg/dl — ABNORMAL HIGH (ref 70–99)

## 2012-05-26 LAB — CBC WITH DIFFERENTIAL/PLATELET
Eosinophils Absolute: 0.1 10*3/uL (ref 0.0–0.5)
HCT: 36.1 % (ref 34.8–46.6)
LYMPH%: 49.3 % (ref 14.0–49.7)
MONO#: 0.8 10*3/uL (ref 0.1–0.9)
NEUT#: 1.2 10*3/uL — ABNORMAL LOW (ref 1.5–6.5)
NEUT%: 29.1 % — ABNORMAL LOW (ref 38.4–76.8)
Platelets: 110 10*3/uL — ABNORMAL LOW (ref 145–400)
WBC: 4.1 10*3/uL (ref 3.9–10.3)

## 2012-05-26 MED ORDER — FLUOROURACIL CHEMO INJECTION 2.5 GM/50ML
400.0000 mg/m2 | Freq: Once | INTRAVENOUS | Status: AC
  Administered 2012-05-26: 750 mg via INTRAVENOUS
  Filled 2012-05-26: qty 15

## 2012-05-26 MED ORDER — LEUCOVORIN CALCIUM INJECTION 350 MG
400.0000 mg/m2 | Freq: Once | INTRAVENOUS | Status: AC
  Administered 2012-05-26: 740 mg via INTRAVENOUS
  Filled 2012-05-26: qty 37

## 2012-05-26 MED ORDER — HEPARIN SOD (PORK) LOCK FLUSH 100 UNIT/ML IV SOLN
500.0000 [IU] | Freq: Once | INTRAVENOUS | Status: DC | PRN
  Filled 2012-05-26: qty 5

## 2012-05-26 MED ORDER — SODIUM CHLORIDE 0.9 % IJ SOLN
10.0000 mL | INTRAMUSCULAR | Status: DC | PRN
  Filled 2012-05-26: qty 10

## 2012-05-26 MED ORDER — SODIUM CHLORIDE 0.9 % IV SOLN
2400.0000 mg/m2 | INTRAVENOUS | Status: DC
  Administered 2012-05-26: 4450 mg via INTRAVENOUS
  Filled 2012-05-26: qty 89

## 2012-05-26 NOTE — Progress Notes (Signed)
   [facility]    OFFICE PROGRESS NOTE   INTERVAL HISTORY:   She returns as scheduled. She completed another cycle of chemotherapy on 05/12/2012. No nausea, mouth sores, or diarrhea. She reports numbness and soreness in the fingertips. No toe numbness. The numbness has not interfere with activity.  Objective:  Vital signs in last 24 hours:  Blood pressure 153/88, pulse 85, temperature 97 F (36.1 C), temperature source Oral, resp. rate 18, height 5\' 5"  (1.651 m), weight 160 lb 4.8 oz (72.712 kg).    HEENT: No thrush or ulcers Resp: Lungs clear bilaterally Cardio: Regular rate and rhythm GI: No hepatomegaly, nontender Vascular: No leg edema Neuro: Mild decrease in vibratory sense at the fingertips bilaterally  Skin: Hyperpigmentation and skin thickening at the hands, no ulcers   Portacath/PICC-without erythema  Lab Results:  Lab Results  Component Value Date   WBC 4.1 05/26/2012   HGB 11.6 05/26/2012   HCT 36.1 05/26/2012   MCV 94.5 05/26/2012   PLT 110* 05/26/2012   ANC 1.2   Medications: I have reviewed the patient's current medications.  Assessment/Plan: 1. 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 Adjuvant FOLFOX chemotherapy was initiated on 01/21/2012. 2. Delayed nausea following cycle 2 FOLFOX. Aloxi was added with cycle 3. Persistent delayed nausea. Emend was added with cycle 4. She noted improvement in the nausea following cycle 4. She declined steroid prophylaxis.  3. Tiny lung nodules seen on a CT of the chest 10/19/2011. 4. History of tobacco use 5. Endometrial thickening on CT of the abdomen 10/02/2011. 6. Port-A-Cath placement 01/09/2012. 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 She has mild to moderate decrease in vibratory sense at the fingertips on exam today. 8. Mild thrombocytopenia secondary to chemotherapy. Oxaliplatin will be held again  today. 9. Neutropenia secondary to chemotherapy 10. Hypokalemia on labs 04/28/2012. She is taking a potassium supplement. Chemistry level today is pending.   Disposition:  She appears stable. Oxaliplatin will be held from the chemotherapy today secondary to neutropenia/thrombocytopenia and neuropathy. Ms. Marusak will return for cycle 11 of adjuvant therapy in 2 weeks. She would like to delay the final cycle of adjuvant therapy until after the new year holiday. We will consider adding oxaliplatin back into the chemotherapy regimen with the last 2 cycles if the neuropathy symptoms are no worse.   Thornton Papas, MD  05/26/2012  9:34 AM

## 2012-05-26 NOTE — Telephone Encounter (Signed)
gv and printed appt schedule for pt for Jan 2014...emailed michelle to add chemo...the patient aware °

## 2012-05-26 NOTE — Progress Notes (Signed)
Patient reports she is eating well. She has gained 2 pounds. Her weight was documented as 160.3 pounds which is increased from 158 pounds November 18. Patient reports poor appetite continues as well as taste alterations. She has found that if she eats vinegar-based food prior to her meal she can taste her food better. She denies nausea, vomiting, diarrhea, and constipation.  Nutrition diagnosis: Food and nutrition related knowledge deficit continues but has improved.  Intervention: I educated patient on other strategies for improving taste. I provided fact sheets for her to take with her today. Patient able to teach back strategies for increasing taste sensations at meal time.  Monitoring, evaluation, goals: The patient continues to tolerate oral intake for weight stabilization.  Next visit: Monday, December 16, during treatment.

## 2012-05-26 NOTE — Telephone Encounter (Signed)
Per staff message and POf I have scheduled appt.   JMW

## 2012-05-28 ENCOUNTER — Ambulatory Visit (HOSPITAL_BASED_OUTPATIENT_CLINIC_OR_DEPARTMENT_OTHER): Payer: BC Managed Care – PPO

## 2012-05-28 ENCOUNTER — Encounter: Payer: Self-pay | Admitting: Oncology

## 2012-05-28 VITALS — BP 132/65 | HR 83 | Temp 98.9°F

## 2012-05-28 DIAGNOSIS — C189 Malignant neoplasm of colon, unspecified: Secondary | ICD-10-CM

## 2012-05-28 DIAGNOSIS — C801 Malignant (primary) neoplasm, unspecified: Secondary | ICD-10-CM

## 2012-05-28 MED ORDER — SODIUM CHLORIDE 0.9 % IJ SOLN
10.0000 mL | INTRAMUSCULAR | Status: DC | PRN
Start: 2012-05-28 — End: 2012-05-28
  Administered 2012-05-28: 10 mL
  Filled 2012-05-28: qty 10

## 2012-05-28 MED ORDER — HEPARIN SOD (PORK) LOCK FLUSH 100 UNIT/ML IV SOLN
500.0000 [IU] | Freq: Once | INTRAVENOUS | Status: AC | PRN
  Administered 2012-05-28: 500 [IU]
  Filled 2012-05-28: qty 5

## 2012-05-28 NOTE — Patient Instructions (Signed)
Call MD for problems.  Keep a check on temperature with temp 98.9 today

## 2012-05-29 NOTE — Progress Notes (Signed)
[name] faxed her Disability papers to [facility] [phone].  I faxed her Cancer Policy to [employer] with invoices and path report to [phone]. Total pages 112.

## 2012-06-08 ENCOUNTER — Other Ambulatory Visit: Payer: Self-pay | Admitting: Oncology

## 2012-06-09 ENCOUNTER — Other Ambulatory Visit: Payer: Self-pay | Admitting: Oncology

## 2012-06-09 ENCOUNTER — Other Ambulatory Visit: Payer: BC Managed Care – PPO | Admitting: Lab

## 2012-06-09 ENCOUNTER — Ambulatory Visit: Payer: BC Managed Care – PPO | Admitting: Nutrition

## 2012-06-09 ENCOUNTER — Ambulatory Visit (HOSPITAL_BASED_OUTPATIENT_CLINIC_OR_DEPARTMENT_OTHER): Payer: BC Managed Care – PPO | Admitting: Oncology

## 2012-06-09 ENCOUNTER — Ambulatory Visit (HOSPITAL_BASED_OUTPATIENT_CLINIC_OR_DEPARTMENT_OTHER): Payer: BC Managed Care – PPO

## 2012-06-09 ENCOUNTER — Telehealth: Payer: Self-pay | Admitting: Oncology

## 2012-06-09 VITALS — BP 135/79 | HR 90 | Temp 98.2°F | Resp 20 | Ht 65.0 in | Wt 158.7 lb

## 2012-06-09 DIAGNOSIS — R918 Other nonspecific abnormal finding of lung field: Secondary | ICD-10-CM

## 2012-06-09 DIAGNOSIS — Z5111 Encounter for antineoplastic chemotherapy: Secondary | ICD-10-CM

## 2012-06-09 DIAGNOSIS — C189 Malignant neoplasm of colon, unspecified: Secondary | ICD-10-CM

## 2012-06-09 DIAGNOSIS — C801 Malignant (primary) neoplasm, unspecified: Secondary | ICD-10-CM

## 2012-06-09 DIAGNOSIS — G620 Drug-induced polyneuropathy: Secondary | ICD-10-CM

## 2012-06-09 DIAGNOSIS — D6959 Other secondary thrombocytopenia: Secondary | ICD-10-CM

## 2012-06-09 LAB — CBC WITH DIFFERENTIAL/PLATELET
BASO%: 0.9 % (ref 0.0–2.0)
Basophils Absolute: 0 10e3/uL (ref 0.0–0.1)
EOS%: 4.8 % (ref 0.0–7.0)
Eosinophils Absolute: 0.2 10e3/uL (ref 0.0–0.5)
HCT: 34.6 % — ABNORMAL LOW (ref 34.8–46.6)
HGB: 11.2 g/dL — ABNORMAL LOW (ref 11.6–15.9)
LYMPH%: 47.6 % (ref 14.0–49.7)
MCH: 30.4 pg (ref 25.1–34.0)
MCHC: 32.4 g/dL (ref 31.5–36.0)
MCV: 94 fL (ref 79.5–101.0)
MONO#: 0.6 10e3/uL (ref 0.1–0.9)
MONO%: 13.9 % (ref 0.0–14.0)
NEUT#: 1.5 10e3/uL (ref 1.5–6.5)
NEUT%: 32.8 % — ABNORMAL LOW (ref 38.4–76.8)
Platelets: 124 10e3/uL — ABNORMAL LOW (ref 145–400)
RBC: 3.68 10e6/uL — ABNORMAL LOW (ref 3.70–5.45)
RDW: 15.6 % — ABNORMAL HIGH (ref 11.2–14.5)
WBC: 4.6 10e3/uL (ref 3.9–10.3)
lymph#: 2.2 10e3/uL (ref 0.9–3.3)
nRBC: 0 % (ref 0–0)

## 2012-06-09 LAB — COMPREHENSIVE METABOLIC PANEL (CC13)
Albumin: 3.4 g/dL — ABNORMAL LOW (ref 3.5–5.0)
CO2: 28 mEq/L (ref 22–29)
Calcium: 9.2 mg/dL (ref 8.4–10.4)
Glucose: 118 mg/dl — ABNORMAL HIGH (ref 70–99)
Sodium: 143 mEq/L (ref 136–145)
Total Bilirubin: 0.61 mg/dL (ref 0.20–1.20)
Total Protein: 6.4 g/dL (ref 6.4–8.3)

## 2012-06-09 MED ORDER — SODIUM CHLORIDE 0.9 % IV SOLN
2400.0000 mg/m2 | INTRAVENOUS | Status: DC
  Administered 2012-06-09: 4450 mg via INTRAVENOUS
  Filled 2012-06-09: qty 89

## 2012-06-09 MED ORDER — FLUOROURACIL CHEMO INJECTION 2.5 GM/50ML
400.0000 mg/m2 | Freq: Once | INTRAVENOUS | Status: AC
  Administered 2012-06-09: 750 mg via INTRAVENOUS
  Filled 2012-06-09: qty 15

## 2012-06-09 MED ORDER — PALONOSETRON HCL INJECTION 0.25 MG/5ML: 0.2500 mg | Freq: Once | INTRAVENOUS | Status: DC

## 2012-06-09 MED ORDER — DEXAMETHASONE SODIUM PHOSPHATE 10 MG/ML IJ SOLN: 10.0000 mg | Freq: Once | INTRAMUSCULAR | Status: DC

## 2012-06-09 MED ORDER — HEPARIN SOD (PORK) LOCK FLUSH 100 UNIT/ML IV SOLN
500.0000 [IU] | Freq: Once | INTRAVENOUS | Status: DC | PRN
  Filled 2012-06-09: qty 5

## 2012-06-09 MED ORDER — LEUCOVORIN CALCIUM INJECTION 350 MG
400.0000 mg/m2 | Freq: Once | INTRAVENOUS | Status: AC
  Administered 2012-06-09: 740 mg via INTRAVENOUS
  Filled 2012-06-09: qty 37

## 2012-06-09 MED ORDER — SODIUM CHLORIDE 0.9 % IJ SOLN
10.0000 mL | INTRAMUSCULAR | Status: DC | PRN
  Filled 2012-06-09: qty 10

## 2012-06-09 MED ORDER — SODIUM CHLORIDE 0.9 % IV SOLN: 150.0000 mg | Freq: Once | INTRAVENOUS | Status: DC

## 2012-06-09 NOTE — Progress Notes (Signed)
Patient states she eats a lot throughout the day. She states her family wishes she would eat more but she is eating regularly and "quite a bit" at meals. Her weight was documented as 158.7 pounds December 16, down slightly from 160.3 pounds December 2. She has no nutrition side effects at this time.  Nutrition diagnosis food and nutrition related knowledge deficit improved.  Intervention: I educated patient to continue higher calorie, higher protein foods throughout the day to promote continued weight stabilization.  Monitoring, evaluation, goals: The patient has tolerated oral intake and has maintained her weight within several pounds throughout treatment.  Next visit: Monday, January 6, during chemotherapy.

## 2012-06-09 NOTE — Telephone Encounter (Signed)
appts confirmed with pt. 

## 2012-06-09 NOTE — Progress Notes (Signed)
   [facility]    OFFICE PROGRESS NOTE   INTERVAL HISTORY:   She returns as scheduled. She completed another cycle of chemotherapy on 05/26/2012. No nausea, mouth sores, or diarrhea. She had a mild sore throat and runny nose yesterday. There is hyperpigmentation and skin desquamation at the hands and feet. The numbness in the fingers persists. No foot numbness. The numbness does not interfere with activity.  Objective:  Vital signs in last 24 hours:  Blood pressure 135/79, pulse 90, temperature 98.2 F (36.8 C), temperature source Oral, resp. rate 20, height 5\' 5"  (1.651 m), weight 158 lb 11.2 oz (71.986 kg).    HEENT: No thrush or ulcers Resp: Lungs clear bilaterally Cardio: Regular rate and rhythm GI: No hepatomegaly, nontender Vascular: No leg edema Neuro: The vibratory sense is mildly decreased at the left fingers today Skin: Hyperpigmentation and dry desquamation at the palms and soles   Portacath/PICC-without erythema  Lab Results:  Lab Results  Component Value Date   WBC 4.6 06/09/2012   HGB 11.2* 06/09/2012   HCT 34.6* 06/09/2012   MCV 94.0 06/09/2012   PLT 124* 06/09/2012   ANC 1.5   Medications: I have reviewed the patient's current medications.  Assessment/Plan: 1. 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 Adjuvant FOLFOX chemotherapy was initiated on 01/21/2012. 2. Delayed nausea following cycle 2 FOLFOX. Aloxi was added with cycle 3. Persistent delayed nausea. Emend was added with cycle 4. She noted improvement in the nausea following cycle 4. She declined steroid prophylaxis.  3. Tiny lung nodules seen on a CT of the chest 10/19/2011. 4. History of tobacco use 5. Endometrial thickening on CT of the abdomen 10/02/2011. 6. Port-A-Cath placement 01/09/2012. 7. Oxaliplatin neuropathy with persistent numbness in the fingers  8. Mild thrombocytopenia secondary to  chemotherapy. 9. History of Neutropenia secondary to chemotherapy 10. Hypokalemia on labs 04/28/2012. She is taking a potassium supplement. Chemistry level today is pending.   Disposition:  She appears stable. She has completed 10 cycles of adjuvant chemotherapy (7 with oxaliplatin). We decided to hold the oxaliplatin again today. We will consider adding oxaliplatin back to the chemotherapy regimen for the final cycle on 07/01/2011.   Thornton Papas, MD  06/09/2012  10:30 AM

## 2012-06-09 NOTE — Progress Notes (Signed)
Per MD, Oxaliplatin to be held for today's treatment.

## 2012-06-09 NOTE — Patient Instructions (Addendum)
[facility] Discharge Instructions for Patients Receiving Chemotherapy  Today you received the following chemotherapy agent Leucovorin and 5FU  To help prevent nausea and vomiting after your treatment, we encourage you to take your nausea medication. Begin taking your nausea medication as often as prescribed for by Dr. Truett Perna.    If you develop nausea and vomiting that is not controlled by your nausea medication, call the clinic. If it is after clinic hours your family physician or the after hours number for the clinic or go to the Emergency Department.   BELOW ARE SYMPTOMS THAT SHOULD BE REPORTED IMMEDIATELY:  *FEVER GREATER THAN 100.5 F  *CHILLS WITH OR WITHOUT FEVER  NAUSEA AND VOMITING THAT IS NOT CONTROLLED WITH YOUR NAUSEA MEDICATION  *UNUSUAL SHORTNESS OF BREATH  *UNUSUAL BRUISING OR BLEEDING  TENDERNESS IN MOUTH AND THROAT WITH OR WITHOUT PRESENCE OF ULCERS  *URINARY PROBLEMS  *BOWEL PROBLEMS  UNUSUAL RASH Items with * indicate a potential emergency and should be followed up as soon as possible.  One of the nurses will contact you 24 hours after your treatment. Please let the nurse know about any problems that you may have experienced. Feel free to call the clinic you have any questions or concerns. The clinic phone number is [phone].   I have been informed and understand all the instructions given to me. I know to contact the clinic, my physician, or go to the Emergency Department if any problems should occur. I do not have any questions at this time, but understand that I may call the clinic during office hours   should I have any questions or need assistance in obtaining follow up care.    __________________________________________  _____________  __________ Signature of Patient or Authorized Representative            Date                   Time    __________________________________________ Nurse's Signature

## 2012-06-10 ENCOUNTER — Other Ambulatory Visit: Payer: Self-pay | Admitting: Oncology

## 2012-06-11 ENCOUNTER — Ambulatory Visit (HOSPITAL_BASED_OUTPATIENT_CLINIC_OR_DEPARTMENT_OTHER): Payer: BC Managed Care – PPO

## 2012-06-11 VITALS — Temp 99.4°F

## 2012-06-11 DIAGNOSIS — R5381 Other malaise: Secondary | ICD-10-CM

## 2012-06-11 DIAGNOSIS — C801 Malignant (primary) neoplasm, unspecified: Secondary | ICD-10-CM

## 2012-06-11 DIAGNOSIS — C189 Malignant neoplasm of colon, unspecified: Secondary | ICD-10-CM

## 2012-06-11 MED ORDER — ACETAMINOPHEN 325 MG PO TABS
650.0000 mg | ORAL_TABLET | Freq: Once | ORAL | Status: AC
  Administered 2012-06-11: 650 mg via ORAL

## 2012-06-11 MED ORDER — SODIUM CHLORIDE 0.9 % IJ SOLN
10.0000 mL | INTRAMUSCULAR | Status: DC | PRN
  Administered 2012-06-11: 10 mL
  Filled 2012-06-11: qty 10

## 2012-06-11 MED ORDER — HEPARIN SOD (PORK) LOCK FLUSH 100 UNIT/ML IV SOLN
500.0000 [IU] | Freq: Once | INTRAVENOUS | Status: AC | PRN
  Administered 2012-06-11: 500 [IU]
  Filled 2012-06-11: qty 5

## 2012-06-11 NOTE — Progress Notes (Signed)
Patient complains of generalized fatigue. Temperature 101 while in clinic today. Patient denies nausea, vomiting, diarrhea. Order given and carried out for Tylenol 650 mg po per Dr. Myrle Sheng. Per Dr. Myrle Sheng patient is to push po fluids and check her temperature at home and call us if the temperature does not resolve.  Patient temperature upon departure was 99.1. Patient instructed to push PO fluids and to call us if symptoms persist. Patient verbalized understanding.

## 2012-06-16 ENCOUNTER — Telehealth: Payer: Self-pay | Admitting: Oncology

## 2012-06-16 ENCOUNTER — Ambulatory Visit (HOSPITAL_COMMUNITY)
Admission: RE | Admit: 2012-06-16 | Discharge: 2012-06-16 | Disposition: A | Discharge status: Home / Self Care | Payer: BC Managed Care – PPO | Source: Ambulatory Visit | Attending: Oncology | Admitting: Oncology

## 2012-06-16 ENCOUNTER — Telehealth: Payer: Self-pay | Admitting: *Deleted

## 2012-06-16 ENCOUNTER — Ambulatory Visit (HOSPITAL_BASED_OUTPATIENT_CLINIC_OR_DEPARTMENT_OTHER): Payer: BC Managed Care – PPO | Admitting: Nurse Practitioner

## 2012-06-16 VITALS — BP 128/85 | HR 96 | Temp 100.3°F | Resp 20 | Ht 65.0 in | Wt 153.4 lb

## 2012-06-16 DIAGNOSIS — I82C19 Acute embolism and thrombosis of unspecified internal jugular vein: Secondary | ICD-10-CM

## 2012-06-16 DIAGNOSIS — I82409 Acute embolism and thrombosis of unspecified deep veins of unspecified lower extremity: Secondary | ICD-10-CM

## 2012-06-16 DIAGNOSIS — I82B19 Acute embolism and thrombosis of unspecified subclavian vein: Secondary | ICD-10-CM

## 2012-06-16 DIAGNOSIS — C801 Malignant (primary) neoplasm, unspecified: Secondary | ICD-10-CM

## 2012-06-16 DIAGNOSIS — M7989 Other specified soft tissue disorders: Secondary | ICD-10-CM | POA: Insufficient documentation

## 2012-06-16 DIAGNOSIS — M542 Cervicalgia: Secondary | ICD-10-CM

## 2012-06-16 DIAGNOSIS — R221 Localized swelling, mass and lump, neck: Secondary | ICD-10-CM

## 2012-06-16 DIAGNOSIS — M79609 Pain in unspecified limb: Secondary | ICD-10-CM

## 2012-06-16 DIAGNOSIS — C189 Malignant neoplasm of colon, unspecified: Secondary | ICD-10-CM

## 2012-06-16 DIAGNOSIS — D6959 Other secondary thrombocytopenia: Secondary | ICD-10-CM

## 2012-06-16 DIAGNOSIS — R220 Localized swelling, mass and lump, head: Secondary | ICD-10-CM | POA: Insufficient documentation

## 2012-06-16 MED ORDER — ENOXAPARIN SODIUM 100 MG/ML ~~LOC~~ SOLN
100.0000 mg | Freq: Once | SUBCUTANEOUS | Status: AC
  Administered 2012-06-16: 100 mg via SUBCUTANEOUS
  Filled 2012-06-16: qty 1

## 2012-06-16 MED ORDER — WARFARIN SODIUM 5 MG PO TABS: 5.0000 mg | ORAL_TABLET | Freq: Every day | ORAL | Status: DC

## 2012-06-16 NOTE — Progress Notes (Signed)
OFFICE PROGRESS NOTE  Interval history:  [name] is a 66 year old woman with stage IIIa colon cancer currently completing adjuvant FOLFOX chemotherapy. She completed cycle 11 beginning 06/09/2012.  She presents to the office today for an unscheduled visit for evaluation of right sided neck pain and swelling. When she woke up this morning she noted the pain and swelling. The right arm is not swollen. Her Port-A-Cath is at the right upper chest. She denies any redness or pain with the actual port site. No nausea or vomiting. No diarrhea. She continues to note numbness in the fingertips. She reports a low-grade fever and cough last week. No shortness of breath or chest pain.   Objective: Blood pressure 128/85, pulse 96, temperature 100.3 F (37.9 C), temperature source Oral, resp. rate 20, height 5\' 5"  (1.651 m), weight 153 lb 6.4 oz (69.582 kg).  Edema/fullness, tenderness at the right neck. Mild vein distention over the right chest. The Port-A-Cath site is without erythema. The right arm is not edematous. Lungs are clear. Regular cardiac rhythm. Abdomen soft and nontender. Legs are without edema.  Lab Results: Lab Results  Component Value Date   WBC 4.6 06/09/2012   HGB 11.2* 06/09/2012   HCT 34.6* 06/09/2012   MCV 94.0 06/09/2012   PLT 124* 06/09/2012    Chemistry:    Chemistry      Component Value Date/Time   NA 143 06/09/2012 0917   NA 139 02/04/2012 1049   K 3.5 06/09/2012 0917   K 3.4* 02/04/2012 1049   CL 108* 06/09/2012 0917   CL 104 02/04/2012 1049   CO2 28 06/09/2012 0917   CO2 27 02/04/2012 1049   BUN 8.0 06/09/2012 0917   BUN 8 02/04/2012 1049   CREATININE 0.7 06/09/2012 0917   CREATININE 0.52 02/04/2012 1049      Component Value Date/Time   CALCIUM 9.2 06/09/2012 0917   CALCIUM 9.0 02/04/2012 1049   ALKPHOS 92 06/09/2012 0917   ALKPHOS 75 02/04/2012 1049   AST 28 06/09/2012 0917   AST 16 02/04/2012 1049   ALT 21 06/09/2012 0917   ALT 10 02/04/2012 1049   BILITOT  0.61 06/09/2012 0917   BILITOT 0.2* 02/04/2012 1049       Studies/Results: No results found.  Medications: I have reviewed the patient's current medications.  Assessment/Plan:  1. 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 Adjuvant FOLFOX chemotherapy was initiated on 01/21/2012. 2. Delayed nausea following cycle 2 FOLFOX. Aloxi was added with cycle 3. Persistent delayed nausea. Emend was added with cycle 4. She noted improvement in the nausea following cycle 4. She declined steroid prophylaxis.  3. Tiny lung nodules seen on a CT of the chest 10/19/2011. 4. History of tobacco use 5. Endometrial thickening on CT of the abdomen 10/02/2011. 6. Port-A-Cath placement 01/09/2012. 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  8. Mild thrombocytopenia secondary to chemotherapy. 9. History of Neutropenia secondary to chemotherapy 10. Hypokalemia on labs 04/28/2012. She is taking a potassium supplement.  11. Pain/tenderness and edema right neck. Concerning for a catheter associated DVT.  Disposition-we are referring [name] for a doppler study of the right arm and neck to evaluate for a DVT. She will return to the office following the study. Anticoagulation will be initiated as indicated.  Lonna Cobb ANP/GNP-BC     Addendum-the Doppler study confirmed a right jugular and subclavian DVT. She will begin Lovenox 1.5 mg per kilogram daily (  100 mg) today as well as Coumadin 5 mg daily. The Lovenox will be continued until the Coumadin is therapeutic. She will return for a PT/INR and visit with the Coumadin clinic on 06/20/2012.  Plan reviewed with Dr. Truett Perna.

## 2012-06-16 NOTE — Telephone Encounter (Signed)
Pt sent to [facility] for doppler and aware she must return to office after doppler. Camilla in vas lab also aware pt must return to office. No additional orders.

## 2012-06-16 NOTE — Progress Notes (Signed)
VASCULAR LAB PRELIMINARY  PRELIMINARY  PRELIMINARY  PRELIMINARY  Right upper extremity venous Doppler completed.    Preliminary report:  There is acute DVT noted in the right jugular and subclavian vein.  Zarif Rathje, RVT 06/16/2012, 3:21 PM

## 2012-06-16 NOTE — Telephone Encounter (Signed)
Gave pt appt for injections every day until 06/20/12, need to talk to [facility] regarding injections for 06/18/12

## 2012-06-16 NOTE — Telephone Encounter (Signed)
PT.'S PORT A CATH SITE IS ON THE RIGHT SIDE. IT IS ALSO SWOLLEN, VERBAL ORDER AND READ BACK TO DR.SHERRILL- PT. TO SEE LISA THOMAS,NP. NOTIFIED PT. SHE VOICES UNDERSTANDING.

## 2012-06-16 NOTE — Patient Instructions (Addendum)
Start Coumadin 5 mg daily today (06/16/12).  Enoxaparin injection What is this medicine? ENOXAPARIN (ee nox a PA rin) is used after knee, hip, or abdominal surgeries to prevent blood clotting. It is also used to treat existing blood clots in the lungs or in the veins. This medicine may be used for other purposes; ask your health care provider or pharmacist if you have questions. What should I tell my health care provider before I take this medicine? They need to know if you have any of these conditions: -bleeding disorders, hemorrhage, or hemophilia -infection of the heart or heart valves -kidney or liver disease -previous stroke -prosthetic heart valve -recent surgery or delivery of a baby -ulcer in the stomach or intestine, diverticulitis, or other bowel disease -an unusual or allergic reaction to enoxaparin, heparin, pork or pork products, other medicines, foods, dyes, or preservatives -pregnant or trying to get pregnant -breast-feeding How should I use this medicine? This medicine is for injection under the skin. It is usually given by a health-care professional. You or a family member may be trained on how to give the injections. If you are to give yourself injections, make sure you understand how to use the syringe, measure the dose if necessary, and give the injection. To avoid bruising, do not rub the site where this medicine has been injected. Do not take your medicine more often than directed. Do not stop taking except on the advice of your doctor or health care professional. Make sure you receive a puncture-resistant container to dispose of the needles and syringes once you have finished with them. Do not reuse these items. Return the container to your doctor or health care professional for proper disposal. Talk to your pediatrician regarding the use of this medicine in children. Special care may be needed. Overdosage: If you think you have taken too much of this medicine contact a  poison control center or emergency room at once. NOTE: This medicine is only for you. Do not share this medicine with others. What if I miss a dose? If you miss a dose, take it as soon as you can. If it is almost time for your next dose, take only that dose. Do not take double or extra doses. What may interact with this medicine? Do not take this medicine with any of the following medications: -aspirin and aspirin-like medicines -heparin -mifepristone -warfarin This medicine may also interact with the following medications: -cilostazol -clopidogrel -dipyridamole -NSAIDs, medicines for pain and inflammation, like ibuprofen or naproxen -sulfinpyrazone -ticlopidine This list may not describe all possible interactions. Give your health care provider a list of all the medicines, herbs, non-prescription drugs, or dietary supplements you use. Also tell them if you smoke, drink alcohol, or use illegal drugs. Some items may interact with your medicine. What should I watch for while using this medicine? Visit your doctor or health care professional for regular checks on your progress. Your condition will be monitored carefully while you are receiving this medicine. Contact your doctor or health care professional and seek emergency treatment if you develop increased difficulty in breathing, chest pain, dizziness, shortness of breath, swelling in the legs or arms, abdominal pain, decreased vision, pain when walking, or pain and warmth of the arms or legs. These can be signs that your condition has gotten worse. Monitor your skin closely for easy bruising or red spots, which can be signs of bleeding. If you notice easy bruising or minor bleeding from the nose, gums/teeth, in your urine, or  stool, contact your doctor or health care professional right away. The dose of your medicine may need to be changed. If you are going to have surgery, tell your doctor or health care professional that you are taking this  medicine. Try to avoid injury while you are using this medicine. Be careful when brushing or flossing your teeth, shaving, cutting your fingernails or toenails, or when using sharp objects. Report any injuries to your doctor or health care professional. What side effects may I notice from receiving this medicine? Side effects that you should report to your doctor or health care professional as soon as possible: -allergic reactions like skin rash, itching or hives, swelling of the face, lips, or tongue -black, tarry stools -breathing problems -dark urine -feeling faint or lightheaded, falls -fever -heavy menstrual bleeding -unusual bruising or bleeding Side effects that usually do not require medical attention (report to your doctor or health care professional if they continue or are bothersome): -pain or irritation at the injection site This list may not describe all possible side effects. Call your doctor for medical advice about side effects. You may report side effects to FDA at 1-800-FDA-1088. Where should I keep my medicine? Keep out of the reach of children. Store at room temperature between 15 and 30 degrees C (59 and 86 degrees F). Do not freeze. If your injections have been specially prepared, you may need to store them in the refrigerator. Ask your pharmacist. Throw away any unused medicine after the expiration date. NOTE: This sheet is a summary. It may not cover all possible information. If you have questions about this medicine, talk to your doctor, pharmacist, or health care provider.  2013, [employer]. (08/22/2007 4:47:37 PM)

## 2012-06-17 ENCOUNTER — Ambulatory Visit (HOSPITAL_BASED_OUTPATIENT_CLINIC_OR_DEPARTMENT_OTHER): Payer: BC Managed Care – PPO

## 2012-06-17 VITALS — BP 121/73 | HR 88 | Temp 99.2°F

## 2012-06-17 DIAGNOSIS — I82409 Acute embolism and thrombosis of unspecified deep veins of unspecified lower extremity: Secondary | ICD-10-CM

## 2012-06-17 MED ORDER — ENOXAPARIN SODIUM 100 MG/ML ~~LOC~~ SOLN
100.0000 mg | Freq: Once | SUBCUTANEOUS | Status: AC
  Administered 2012-06-17: 100 mg via SUBCUTANEOUS
  Filled 2012-06-17: qty 1

## 2012-06-18 ENCOUNTER — Other Ambulatory Visit: Payer: Self-pay | Admitting: *Deleted

## 2012-06-18 ENCOUNTER — Ambulatory Visit (HOSPITAL_BASED_OUTPATIENT_CLINIC_OR_DEPARTMENT_OTHER): Payer: BC Managed Care – PPO

## 2012-06-18 DIAGNOSIS — I82409 Acute embolism and thrombosis of unspecified deep veins of unspecified lower extremity: Secondary | ICD-10-CM

## 2012-06-18 DIAGNOSIS — I82C19 Acute embolism and thrombosis of unspecified internal jugular vein: Secondary | ICD-10-CM

## 2012-06-18 MED ORDER — ENOXAPARIN SODIUM 100 MG/ML ~~LOC~~ SOLN
100.0000 mg | Freq: Once | SUBCUTANEOUS | Status: DC
  Filled 2012-06-18: qty 1

## 2012-06-19 ENCOUNTER — Ambulatory Visit: Payer: BC Managed Care – PPO

## 2012-06-19 ENCOUNTER — Ambulatory Visit (HOSPITAL_BASED_OUTPATIENT_CLINIC_OR_DEPARTMENT_OTHER): Payer: BC Managed Care – PPO

## 2012-06-19 VITALS — BP 103/65 | HR 96 | Temp 99.0°F

## 2012-06-19 DIAGNOSIS — I82409 Acute embolism and thrombosis of unspecified deep veins of unspecified lower extremity: Secondary | ICD-10-CM

## 2012-06-19 MED ORDER — ENOXAPARIN SODIUM 100 MG/ML ~~LOC~~ SOLN
100.0000 mg | Freq: Once | SUBCUTANEOUS | Status: AC
  Administered 2012-06-19: 100 mg via SUBCUTANEOUS
  Filled 2012-06-19: qty 1

## 2012-06-20 ENCOUNTER — Telehealth: Payer: Self-pay | Admitting: Oncology

## 2012-06-20 ENCOUNTER — Encounter: Payer: Self-pay | Admitting: *Deleted

## 2012-06-20 ENCOUNTER — Other Ambulatory Visit (HOSPITAL_BASED_OUTPATIENT_CLINIC_OR_DEPARTMENT_OTHER): Payer: BC Managed Care – PPO | Admitting: Lab

## 2012-06-20 ENCOUNTER — Other Ambulatory Visit: Payer: Self-pay | Admitting: Pharmacist

## 2012-06-20 ENCOUNTER — Ambulatory Visit: Payer: BC Managed Care – PPO | Admitting: Pharmacist

## 2012-06-20 ENCOUNTER — Ambulatory Visit (HOSPITAL_BASED_OUTPATIENT_CLINIC_OR_DEPARTMENT_OTHER): Payer: BC Managed Care – PPO

## 2012-06-20 ENCOUNTER — Other Ambulatory Visit: Payer: Self-pay | Admitting: *Deleted

## 2012-06-20 VITALS — BP 129/65 | HR 77 | Temp 97.2°F | Resp 18

## 2012-06-20 DIAGNOSIS — I82409 Acute embolism and thrombosis of unspecified deep veins of unspecified lower extremity: Secondary | ICD-10-CM

## 2012-06-20 LAB — PROTIME-INR: INR: 1.8 — ABNORMAL LOW (ref 2.00–3.50)

## 2012-06-20 MED ORDER — ENOXAPARIN SODIUM 100 MG/ML ~~LOC~~ SOLN
100.0000 mg | Freq: Once | SUBCUTANEOUS | Status: AC
  Administered 2012-06-20: 100 mg via SUBCUTANEOUS
  Filled 2012-06-20: qty 1

## 2012-06-20 NOTE — Telephone Encounter (Signed)
Per heather in coumadin clinic she needs to continue lovenox injs on sat and Monday and she will enter orders for this.  Pt states that she is to have a inj on Sunday as well and per heather she can skip that day.  Pt is not happy about this and i have left a note for dr Truett Perna regarding her.      anne

## 2012-06-20 NOTE — Progress Notes (Signed)
MD aware of INR 1.8 today. OK to continue Lovenox 12/28 and 12/30 (may skip 12/29 since patient unwilling to learn to self administer med). Coumadin clinic will recheck her INR on 12/31

## 2012-06-20 NOTE — Telephone Encounter (Signed)
per dr sherrill/susan the pt does not need an inj on sunday 12/29.  pt is aware    anne

## 2012-06-20 NOTE — Progress Notes (Signed)
INR subtherapeutic today (1.8) after 3 doses of Coumadin 5mg .  Pt seen for first visit to Coumadin Clinic with her daughter.  A full discussion of the nature of anticoagulants has been carried out.  A benefit risk analysis has been presented to the patient, so that they understand the justification for choosing anticoagulation at this time. The need for frequent and regular monitoring, precise dosage adjustment and compliance is stressed.  Side effects of potential bleeding are discussed.  The patient should avoid any OTC items containing aspirin or ibuprofen, and should avoid great swings in general diet. Provided "What you should know about warfarin and your diet" handout.  Avoid alcohol consumption, which pt says she never drinks.  Call if any signs of abnormal bleeding.    Pt denies any problems with bleeding or bruising or s/s of clotting besides her acute DVT in her jugular.  She states that this is still swollen and hot to the touch.  Explained to patient that it can take weeks to months for the clot to completely dissolve.  Pt communicated understanding.  Will have pt continue current dose, and recheck INR in 4 days.

## 2012-06-21 ENCOUNTER — Ambulatory Visit (HOSPITAL_BASED_OUTPATIENT_CLINIC_OR_DEPARTMENT_OTHER): Payer: BC Managed Care – PPO

## 2012-06-21 ENCOUNTER — Other Ambulatory Visit: Payer: Self-pay | Admitting: Oncology

## 2012-06-21 VITALS — BP 121/58 | HR 103 | Temp 99.3°F | Resp 20

## 2012-06-21 DIAGNOSIS — I82409 Acute embolism and thrombosis of unspecified deep veins of unspecified lower extremity: Secondary | ICD-10-CM

## 2012-06-21 MED ORDER — ENOXAPARIN SODIUM 100 MG/ML ~~LOC~~ SOLN
100.0000 mg | Freq: Once | SUBCUTANEOUS | Status: AC
  Administered 2012-06-21: 100 mg via SUBCUTANEOUS

## 2012-06-23 ENCOUNTER — Telehealth: Payer: Self-pay | Admitting: Oncology

## 2012-06-23 ENCOUNTER — Ambulatory Visit (HOSPITAL_BASED_OUTPATIENT_CLINIC_OR_DEPARTMENT_OTHER): Payer: BC Managed Care – PPO

## 2012-06-23 VITALS — BP 112/72 | HR 61 | Temp 97.1°F

## 2012-06-23 DIAGNOSIS — I82409 Acute embolism and thrombosis of unspecified deep veins of unspecified lower extremity: Secondary | ICD-10-CM

## 2012-06-23 MED ORDER — ENOXAPARIN SODIUM 100 MG/ML ~~LOC~~ SOLN
100.0000 mg | Freq: Once | SUBCUTANEOUS | Status: AC
  Administered 2012-06-23: 100 mg via SUBCUTANEOUS
  Filled 2012-06-23: qty 1

## 2012-06-23 NOTE — Progress Notes (Signed)
Patient has bruising from previous injection just left of umbilicus

## 2012-06-23 NOTE — Progress Notes (Signed)
Late entry for 06/18/12: Lovenox 100 mg sq to left abdomen. Tolerated well.

## 2012-06-23 NOTE — Telephone Encounter (Signed)
Gave patient appt for January 2014 lab and coumadin, then see ML on 06/30/12

## 2012-06-23 NOTE — Patient Instructions (Addendum)
Call MD for problems.  Next appt 06/24/12 Lab and Coumadin clinic.  Schedule given to patient

## 2012-06-24 ENCOUNTER — Other Ambulatory Visit: Payer: BC Managed Care – PPO | Admitting: Lab

## 2012-06-24 ENCOUNTER — Other Ambulatory Visit (HOSPITAL_BASED_OUTPATIENT_CLINIC_OR_DEPARTMENT_OTHER): Payer: BC Managed Care – PPO | Admitting: Lab

## 2012-06-24 ENCOUNTER — Ambulatory Visit (HOSPITAL_BASED_OUTPATIENT_CLINIC_OR_DEPARTMENT_OTHER): Payer: BC Managed Care – PPO | Admitting: Pharmacist

## 2012-06-24 DIAGNOSIS — I82409 Acute embolism and thrombosis of unspecified deep veins of unspecified lower extremity: Secondary | ICD-10-CM

## 2012-06-24 DIAGNOSIS — C801 Malignant (primary) neoplasm, unspecified: Secondary | ICD-10-CM

## 2012-06-24 DIAGNOSIS — I82B19 Acute embolism and thrombosis of unspecified subclavian vein: Secondary | ICD-10-CM

## 2012-06-24 DIAGNOSIS — I82C19 Acute embolism and thrombosis of unspecified internal jugular vein: Secondary | ICD-10-CM

## 2012-06-24 LAB — PROTIME-INR: INR: 3.5 (ref 2.00–3.50)

## 2012-06-24 NOTE — Progress Notes (Signed)
INR supratherapeutic today (3.5) on 5mg  daily. No changes in meds or diet.  No significant problems with bleeding or bruising.  Pt is very excited that her INR is better and she can come off the Lovenox shots today.  Spoke with patient about flu vaccination today.  She usually is afraid of needles, but says that I convinced her to get the shot.   Will discontinue Lovenox today, and decrease dose of Coumadin to 5mg  daily except 2.5mg  on TuThSa.  Will verify with Dr. Truett Perna that she can receive the 2013 inactivated influenza vaccine with her treatment next week.  Will recheck INR in ~1 week with existing lab/NP/infusion appts.

## 2012-06-26 ENCOUNTER — Telehealth: Payer: Self-pay | Admitting: *Deleted

## 2012-06-26 NOTE — Telephone Encounter (Signed)
Pt called requesting flu shot next office visit 06/30/12.  Per Dr. Truett Perna OK to receive flu shot 06/30/12.  Pt verbalized understanding and expressed appreciation for call back.

## 2012-06-28 ENCOUNTER — Other Ambulatory Visit: Payer: Self-pay | Admitting: Oncology

## 2012-06-30 ENCOUNTER — Ambulatory Visit (HOSPITAL_BASED_OUTPATIENT_CLINIC_OR_DEPARTMENT_OTHER): Payer: BC Managed Care – PPO | Admitting: Nurse Practitioner

## 2012-06-30 ENCOUNTER — Encounter: Payer: BC Managed Care – PPO | Admitting: Nutrition

## 2012-06-30 ENCOUNTER — Other Ambulatory Visit (HOSPITAL_BASED_OUTPATIENT_CLINIC_OR_DEPARTMENT_OTHER): Payer: BC Managed Care – PPO | Admitting: Lab

## 2012-06-30 ENCOUNTER — Telehealth: Payer: Self-pay | Admitting: Oncology

## 2012-06-30 ENCOUNTER — Ambulatory Visit: Payer: BC Managed Care – PPO | Admitting: Pharmacist

## 2012-06-30 ENCOUNTER — Ambulatory Visit: Payer: BC Managed Care – PPO | Admitting: Nutrition

## 2012-06-30 ENCOUNTER — Ambulatory Visit (HOSPITAL_BASED_OUTPATIENT_CLINIC_OR_DEPARTMENT_OTHER): Payer: BC Managed Care – PPO

## 2012-06-30 VITALS — BP 112/63 | HR 96 | Temp 98.7°F | Resp 20 | Ht 65.0 in | Wt 153.0 lb

## 2012-06-30 DIAGNOSIS — C189 Malignant neoplasm of colon, unspecified: Secondary | ICD-10-CM

## 2012-06-30 DIAGNOSIS — D649 Anemia, unspecified: Secondary | ICD-10-CM

## 2012-06-30 DIAGNOSIS — Z86718 Personal history of other venous thrombosis and embolism: Secondary | ICD-10-CM

## 2012-06-30 DIAGNOSIS — C801 Malignant (primary) neoplasm, unspecified: Secondary | ICD-10-CM

## 2012-06-30 DIAGNOSIS — Z5111 Encounter for antineoplastic chemotherapy: Secondary | ICD-10-CM

## 2012-06-30 DIAGNOSIS — G620 Drug-induced polyneuropathy: Secondary | ICD-10-CM

## 2012-06-30 DIAGNOSIS — I82409 Acute embolism and thrombosis of unspecified deep veins of unspecified lower extremity: Secondary | ICD-10-CM

## 2012-06-30 LAB — CBC WITH DIFFERENTIAL/PLATELET
BASO%: 0.4 % (ref 0.0–2.0)
EOS%: 1.9 % (ref 0.0–7.0)
HCT: 31.9 % — ABNORMAL LOW (ref 34.8–46.6)
LYMPH%: 32.1 % (ref 14.0–49.7)
MCH: 29.8 pg (ref 25.1–34.0)
MCHC: 31 g/dL — ABNORMAL LOW (ref 31.5–36.0)
MONO#: 0.8 10*3/uL (ref 0.1–0.9)
NEUT%: 54.2 % (ref 38.4–76.8)
Platelets: 299 10*3/uL (ref 145–400)
RBC: 3.32 10*6/uL — ABNORMAL LOW (ref 3.70–5.45)
WBC: 7 10*3/uL (ref 3.9–10.3)
nRBC: 0 % (ref 0–0)

## 2012-06-30 LAB — COMPREHENSIVE METABOLIC PANEL (CC13)
Albumin: 3.3 g/dL — ABNORMAL LOW (ref 3.5–5.0)
BUN: 7 mg/dL (ref 7.0–26.0)
CO2: 27 mEq/L (ref 22–29)
Calcium: 9.6 mg/dL (ref 8.4–10.4)
Chloride: 104 mEq/L (ref 98–107)
Creatinine: 0.6 mg/dL (ref 0.6–1.1)
Glucose: 99 mg/dl (ref 70–99)
Potassium: 4 mEq/L (ref 3.5–5.1)

## 2012-06-30 LAB — PROTIME-INR

## 2012-06-30 MED ORDER — SODIUM CHLORIDE 0.9 % IV SOLN
2400.0000 mg/m2 | INTRAVENOUS | Status: DC
  Administered 2012-06-30: 4450 mg via INTRAVENOUS
  Filled 2012-06-30: qty 89

## 2012-06-30 MED ORDER — FLUOROURACIL CHEMO INJECTION 2.5 GM/50ML
400.0000 mg/m2 | Freq: Once | INTRAVENOUS | Status: AC
  Administered 2012-06-30: 750 mg via INTRAVENOUS
  Filled 2012-06-30: qty 15

## 2012-06-30 MED ORDER — LEUCOVORIN CALCIUM INJECTION 350 MG
400.0000 mg/m2 | Freq: Once | INTRAVENOUS | Status: AC
  Administered 2012-06-30: 740 mg via INTRAVENOUS
  Filled 2012-06-30: qty 37

## 2012-06-30 NOTE — Progress Notes (Signed)
Patient continues to do well with her oral intake. Her weight is stable at 153 pounds today, January 6 from 153.4 pounds December 23.  She is pleased today is her last treatment. She has no nutrition concerns.   Nutrition diagnosis: Food and nutrition related knowledge deficit resolved.   I've encouraged patient to contact me if she has any questions or concerns in the future. Patient is agreeable.

## 2012-06-30 NOTE — Patient Instructions (Addendum)
[facility] Discharge Instructions for Patients Receiving Chemotherapy  Today you received the following chemotherapy agents Leucovorin and 5FU  To help prevent nausea and vomiting after your treatment, we encourage you to take your nausea medication as prescribed.   If you develop nausea and vomiting that is not controlled by your nausea medication, call the clinic. If it is after clinic hours your family physician or the after hours number for the clinic or go to the Emergency Department.   BELOW ARE SYMPTOMS THAT SHOULD BE REPORTED IMMEDIATELY:  *FEVER GREATER THAN 100.5 F  *CHILLS WITH OR WITHOUT FEVER  NAUSEA AND VOMITING THAT IS NOT CONTROLLED WITH YOUR NAUSEA MEDICATION  *UNUSUAL SHORTNESS OF BREATH  *UNUSUAL BRUISING OR BLEEDING  TENDERNESS IN MOUTH AND THROAT WITH OR WITHOUT PRESENCE OF ULCERS  *URINARY PROBLEMS  *BOWEL PROBLEMS  UNUSUAL RASH Items with * indicate a potential emergency and should be followed up as soon as possible.  Feel free to call the clinic you have any questions or concerns. The clinic phone number is [phone].   I have been informed and understand all the instructions given to me. I know to contact the clinic, my physician, or go to the Emergency Department if any problems should occur. I do not have any questions at this time, but understand that I may call the clinic during office hours   should I have any questions or need assistance in obtaining follow up care.    __________________________________________  _____________  __________ Signature of Patient or Authorized Representative            Date                   Time    __________________________________________ Nurse's Signature    

## 2012-06-30 NOTE — Progress Notes (Signed)
INR = 3.5 despite dose reduction last week to 5 mg/day except 2.5 mg Tu/Th/Sat. Pt took Coumadin as prescribed. Today is her last chemo tx.  Her platelets are low today. No bleeding but I cautioned pt to monitor for bleeding. INR elevated. Decrease Coumadin dose to 2.5 mg/day; 5 mg Mon & Wed (she already took today's dose). Repeat INR next week. Marily Lente, Pharm.D.

## 2012-06-30 NOTE — Progress Notes (Signed)
OFFICE PROGRESS NOTE  Interval history:  Ms. Dozier returns as scheduled. She completed cycle 11 FOLFOX (no oxaliplatin) on 06/09/2012. She denies nausea/vomiting. No mouth sores. No diarrhea. She notes increased numbness in the fingertips and toes. The fingertips are periodically painful. Intermittently she notes that her "whole body" feels numb.  She continues Coumadin. She denies bleeding. The pain at the right neck is better.  She continues to have a cough and "runny nose". Symptoms are improving. She denies fever and shortness of breath.   Objective: Blood pressure 112/63, pulse 96, temperature 98.7 F (37.1 C), temperature source Oral, resp. rate 20, height 5\' 5"  (1.651 m), weight 153 lb (69.4 kg).  Oropharynx is without thrush or ulceration. The right anterior neck has a slightly full appearance as compared to the left and is mildly tender. Lungs are clear. Regular cardiac rhythm. Port-A-Cath site is without erythema. Abdomen is soft and nontender. No hepatomegaly. Extremities are without edema. Vibratory sense is moderately decreased over the fingertips per tuning fork exam.  Lab Results: Lab Results  Component Value Date   WBC 7.0 06/30/2012   HGB 9.9* 06/30/2012   HCT 31.9* 06/30/2012   MCV 96.1 06/30/2012   PLT 299 06/30/2012    Chemistry:    Chemistry      Component Value Date/Time   NA 143 06/09/2012 0917   NA 139 02/04/2012 1049   K 3.5 06/09/2012 0917   K 3.4* 02/04/2012 1049   CL 108* 06/09/2012 0917   CL 104 02/04/2012 1049   CO2 28 06/09/2012 0917   CO2 27 02/04/2012 1049   BUN 8.0 06/09/2012 0917   BUN 8 02/04/2012 1049   CREATININE 0.7 06/09/2012 0917   CREATININE 0.52 02/04/2012 1049      Component Value Date/Time   CALCIUM 9.2 06/09/2012 0917   CALCIUM 9.0 02/04/2012 1049   ALKPHOS 92 06/09/2012 0917   ALKPHOS 75 02/04/2012 1049   AST 28 06/09/2012 0917   AST 16 02/04/2012 1049   ALT 21 06/09/2012 0917   ALT 10 02/04/2012 1049   BILITOT 0.61 06/09/2012 0917   BILITOT 0.2* 02/04/2012 1049       Studies/Results: No results found.  Medications: I have reviewed the patient's current medications.  Assessment/Plan:  1. 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 Adjuvant FOLFOX chemotherapy was initiated on 01/21/2012. 2. Delayed nausea following cycle 2 FOLFOX. Aloxi was added with cycle 3. Persistent delayed nausea. Emend was added with cycle 4. She noted improvement in the nausea following cycle 4. She declined steroid prophylaxis.  3. Tiny lung nodules seen on a CT of the chest 10/19/2011. 4. History of tobacco use 5. Endometrial thickening on CT of the abdomen 10/02/2011. 6. Port-A-Cath placement 01/09/2012. 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  8. Mild thrombocytopenia secondary to chemotherapy. 9. History of Neutropenia secondary to chemotherapy 10. Hypokalemia on labs 04/28/2012. She is taking a potassium supplement. Potassium was in the low normal range on 05/26/2012 and 06/09/2012. We will followup on the value from today 11. 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 She was initially treated with Lovenox. She is currently on Coumadin. She is followed by the Coumadin clinic. 12. Anemia likely secondary to chemotherapy. She will return for a followup CBC in 2 weeks.  Disposition-Ms. Otwell appears stable. Plan to proceed with the 12th and final cycle of systemic chemotherapy today as scheduled. The oxaliplatin will again be held  due to progressive neuropathy symptoms. She will return for a CBC in 2 weeks to followup the hemoglobin. She will return for a followup visit in approximately one month. She will contact the office in the interim with any problems.   Lonna Cobb ANP/GNP-BC

## 2012-06-30 NOTE — Telephone Encounter (Signed)
appts made and printed for pt anne °

## 2012-07-02 ENCOUNTER — Ambulatory Visit (HOSPITAL_BASED_OUTPATIENT_CLINIC_OR_DEPARTMENT_OTHER): Payer: BC Managed Care – PPO

## 2012-07-02 VITALS — BP 134/86 | HR 89 | Temp 98.5°F | Resp 18

## 2012-07-02 DIAGNOSIS — C801 Malignant (primary) neoplasm, unspecified: Secondary | ICD-10-CM

## 2012-07-02 DIAGNOSIS — Z452 Encounter for adjustment and management of vascular access device: Secondary | ICD-10-CM

## 2012-07-02 DIAGNOSIS — C189 Malignant neoplasm of colon, unspecified: Secondary | ICD-10-CM

## 2012-07-02 MED ORDER — HEPARIN SOD (PORK) LOCK FLUSH 100 UNIT/ML IV SOLN
500.0000 [IU] | Freq: Once | INTRAVENOUS | Status: AC | PRN
  Administered 2012-07-02: 500 [IU]
  Filled 2012-07-02: qty 5

## 2012-07-02 MED ORDER — SODIUM CHLORIDE 0.9 % IJ SOLN
10.0000 mL | INTRAMUSCULAR | Status: DC | PRN
  Administered 2012-07-02: 10 mL
  Filled 2012-07-02: qty 10

## 2012-07-07 ENCOUNTER — Encounter (HOSPITAL_COMMUNITY): Payer: Self-pay | Admitting: *Deleted

## 2012-07-07 ENCOUNTER — Emergency Department (HOSPITAL_COMMUNITY)
Admission: EM | Admit: 2012-07-07 | Discharge: 2012-07-07 | Disposition: A | Discharge status: Home / Self Care | Payer: BC Managed Care – PPO | Attending: Emergency Medicine | Admitting: Emergency Medicine

## 2012-07-07 DIAGNOSIS — F17200 Nicotine dependence, unspecified, uncomplicated: Secondary | ICD-10-CM | POA: Insufficient documentation

## 2012-07-07 DIAGNOSIS — Z923 Personal history of irradiation: Secondary | ICD-10-CM | POA: Insufficient documentation

## 2012-07-07 DIAGNOSIS — Z7901 Long term (current) use of anticoagulants: Secondary | ICD-10-CM | POA: Insufficient documentation

## 2012-07-07 DIAGNOSIS — T45515A Adverse effect of anticoagulants, initial encounter: Secondary | ICD-10-CM

## 2012-07-07 DIAGNOSIS — Z79899 Other long term (current) drug therapy: Secondary | ICD-10-CM | POA: Insufficient documentation

## 2012-07-07 DIAGNOSIS — R040 Epistaxis: Secondary | ICD-10-CM

## 2012-07-07 DIAGNOSIS — Z85038 Personal history of other malignant neoplasm of large intestine: Secondary | ICD-10-CM | POA: Insufficient documentation

## 2012-07-07 DIAGNOSIS — Z87442 Personal history of urinary calculi: Secondary | ICD-10-CM | POA: Insufficient documentation

## 2012-07-07 LAB — PROTIME-INR
INR: 2.06 — ABNORMAL HIGH (ref 0.00–1.49)
Prothrombin Time: 22.4 seconds — ABNORMAL HIGH (ref 11.6–15.2)

## 2012-07-07 LAB — CBC WITH DIFFERENTIAL/PLATELET
Eosinophils Absolute: 0.1 10*3/uL (ref 0.0–0.7)
Lymphocytes Relative: 56 % — ABNORMAL HIGH (ref 12–46)
Lymphs Abs: 3.6 10*3/uL (ref 0.7–4.0)
Neutro Abs: 2.2 10*3/uL (ref 1.7–7.7)
Neutrophils Relative %: 34 % — ABNORMAL LOW (ref 43–77)
Platelets: 198 10*3/uL (ref 150–400)
RBC: 3.33 MIL/uL — ABNORMAL LOW (ref 3.87–5.11)
WBC: 6.4 10*3/uL (ref 4.0–10.5)

## 2012-07-07 NOTE — ED Notes (Signed)
MD at bedside. 

## 2012-07-07 NOTE — ED Provider Notes (Signed)
History     CSN: [id number]  Arrival date & time 07/07/12  1726   First MD Initiated Contact with Patient 07/07/12 1911      Chief Complaint  Patient presents with  . Epistaxis    (Consider location/radiation/quality/duration/timing/severity/associated sxs/prior treatment) Patient is a 67 y.o. female presenting with nosebleeds. The history is provided by the patient.  Epistaxis   She is taking warfarin because of a blood clot in vein in her neck. This evening, she had sudden onset of a left-sided epistaxis. Bleeding last about 10 minutes before stopping. She came to the ED because of her being on warfarin. Last INR was one week ago was 3.5. She states that all of her INRs have been 3.5 since starting on warfarin. The last time she had had a nosebleed was when she was a child.  Past Medical History  Diagnosis Date  . Kidney stone   . Abdominal pain   . Cancer 11/2011    cancer in colon    Past Surgical History  Procedure Date  . Cholecystectomy   . Goiter removal    . Kidney stone surgery   . Tonsillectomy     as child  . Colon resection 12/18/2011    Procedure: COLON RESECTION LAPAROSCOPIC;  Surgeon: Clovis Pu. Cornett, MD;  Location: [facility] OR;  Service: General;  Laterality: N/A;    Family History  Problem Relation Age of Onset  . Breast cancer Mother   . Heart disease Mother   . Hypertension Other     History  Substance Use Topics  . Smoking status: Current Every Day Smoker -- 0.2 packs/day for 35 years    Types: Cigarettes  . Smokeless tobacco: Never Used  . Alcohol Use: No    OB History    Grav Para Term Preterm Abortions TAB SAB Ect Mult Living                  Review of Systems  HENT: Positive for nosebleeds.   All other systems reviewed and are negative.    Allergies  Aspirin; Advil; and Penicillins  Home Medications   Current Outpatient Rx  Name  Route  Sig  Dispense  Refill  . GUAIFENESIN ER 600 MG PO TB12   Oral   Take 600 mg by mouth 2  (two) times daily as needed. For congestion.         Marland Kitchen LIDOCAINE-PRILOCAINE 2.5-2.5 % EX CREA   Topical   Apply topically as needed. Apply to portacath site 1 hour prior to use   30 g   2   . POTASSIUM CHLORIDE CRYS ER 20 MEQ PO TBCR   Oral   Take 20 mEq by mouth daily.         . WARFARIN SODIUM 5 MG PO TABS   Oral   Take 2.5 mg by mouth daily. 0.5 tab daily except for 1 tab on Mon and Wed           BP 115/68  Pulse 71  Temp 98.3 F (36.8 C)  Resp 20  SpO2 99%  Physical Exam  Nursing note and vitals reviewed.  67 year old female, resting comfortably and in no acute distress. Vital signs are normal. Oxygen saturation is 99%, which is normal. Head is normocephalic and atraumatic. PERRLA, EOMI. Oropharynx is clear. There is a small amount of blood present in the left nostril with a likely bleeding site seen on the nasal septum but no active bleeding. Neck is  nontender and supple without adenopathy or JVD. Back is nontender and there is no CVA tenderness. Lungs are clear without rales, wheezes, or rhonchi. Chest is nontender. Heart has regular rate and rhythm without murmur. Abdomen is soft, flat, nontender without masses or hepatosplenomegaly and peristalsis is normoactive. Extremities have no cyanosis or edema, full range of motion is present. Skin is warm and dry without rash. Neurologic: Mental status is normal, cranial nerves are intact, there are no motor or sensory deficits.  ED Course  Procedures (including critical care time)  Results for orders placed during the hospital encounter of 07/07/12  CBC WITH DIFFERENTIAL      Component Value Range   WBC 6.4  4.0 - 10.5 K/uL   RBC 3.33 (*) 3.87 - 5.11 MIL/uL   Hemoglobin 10.1 (*) 12.0 - 15.0 g/dL   HCT 19.1 (*) 47.8 - 29.5 %   MCV 94.3  78.0 - 100.0 fL   MCH 30.3  26.0 - 34.0 pg   MCHC 32.2  30.0 - 36.0 g/dL   RDW 62.1  30.8 - 65.7 %   Platelets 198  150 - 400 K/uL   Neutrophils Relative 34 (*) 43 - 77 %    Neutro Abs 2.2  1.7 - 7.7 K/uL   Lymphocytes Relative 56 (*) 12 - 46 %   Lymphs Abs 3.6  0.7 - 4.0 K/uL   Monocytes Relative 7  3 - 12 %   Monocytes Absolute 0.4  0.1 - 1.0 K/uL   Eosinophils Relative 2  0 - 5 %   Eosinophils Absolute 0.1  0.0 - 0.7 K/uL   Basophils Relative 1  0 - 1 %   Basophils Absolute 0.0  0.0 - 0.1 K/uL  PROTIME-INR      Component Value Range   Prothrombin Time 22.4 (*) 11.6 - 15.2 seconds   INR 2.06 (*) 0.00 - 1.49    1. Left-sided epistaxis   2. Warfarin-induced coagulopathy       MDM  Epistaxis in patient who is anticoagulated. INR will be rechecked. She is also had thrombocytopenia in the past a CBC will be checked. Old records are reviewed and she is getting chemotherapy for colon cancer and the blood clot is related to a port that is being used for chemotherapy.  She's had no recurrence of epistaxis in the ED. INR has come back at the low end of the therapeutic range, so there is no indication for vitamin K. Platelet count is normal. She is sent home with nosebleed instructions and is to return if she is unable to control any bleeding at home.  Dione Booze, MD 07/07/12 2105

## 2012-07-07 NOTE — ED Notes (Signed)
Pt c/o nosebleed that started just prior to arrival. Pt reports she is on coumadin. No bleeding at present.

## 2012-07-09 ENCOUNTER — Other Ambulatory Visit (HOSPITAL_BASED_OUTPATIENT_CLINIC_OR_DEPARTMENT_OTHER): Payer: BC Managed Care – PPO | Admitting: Lab

## 2012-07-09 ENCOUNTER — Encounter: Payer: Self-pay | Admitting: Oncology

## 2012-07-09 ENCOUNTER — Ambulatory Visit (HOSPITAL_BASED_OUTPATIENT_CLINIC_OR_DEPARTMENT_OTHER): Payer: BC Managed Care – PPO | Admitting: Pharmacist

## 2012-07-09 DIAGNOSIS — I82409 Acute embolism and thrombosis of unspecified deep veins of unspecified lower extremity: Secondary | ICD-10-CM

## 2012-07-09 DIAGNOSIS — C189 Malignant neoplasm of colon, unspecified: Secondary | ICD-10-CM

## 2012-07-09 LAB — CBC WITH DIFFERENTIAL/PLATELET
BASO%: 0.4 % (ref 0.0–2.0)
EOS%: 2.9 % (ref 0.0–7.0)
HCT: 32.8 % — ABNORMAL LOW (ref 34.8–46.6)
MCH: 29.9 pg (ref 25.1–34.0)
MCHC: 32 g/dL (ref 31.5–36.0)
NEUT%: 34.4 % — ABNORMAL LOW (ref 38.4–76.8)
lymph#: 3 10*3/uL (ref 0.9–3.3)

## 2012-07-09 LAB — PROTIME-INR
INR: 2.4 (ref 2.00–3.50)
Protime: 28.8 Seconds — ABNORMAL HIGH (ref 10.6–13.4)

## 2012-07-09 NOTE — Progress Notes (Signed)
Continue 2.5mg  daily except 5 mg on Monday & Wednesday Recheck INR on 07/15/12 with lab at 10:30 am & Coumadin clinic at 10:45 am

## 2012-07-09 NOTE — Patient Instructions (Signed)
Continue 2.5mg daily except 5 mg on Monday & Wednesday Recheck INR on 07/15/12 with lab at 10:30 am & Coumadin clinic at 10:45 am 

## 2012-07-15 ENCOUNTER — Other Ambulatory Visit (HOSPITAL_BASED_OUTPATIENT_CLINIC_OR_DEPARTMENT_OTHER): Payer: BC Managed Care – PPO | Admitting: Lab

## 2012-07-15 ENCOUNTER — Ambulatory Visit (HOSPITAL_BASED_OUTPATIENT_CLINIC_OR_DEPARTMENT_OTHER): Payer: BC Managed Care – PPO | Admitting: Pharmacist

## 2012-07-15 DIAGNOSIS — I82409 Acute embolism and thrombosis of unspecified deep veins of unspecified lower extremity: Secondary | ICD-10-CM

## 2012-07-15 LAB — PROTIME-INR: Protime: 22.8 Seconds — ABNORMAL HIGH (ref 10.6–13.4)

## 2012-07-15 NOTE — Progress Notes (Signed)
INR = 1.9 on 2.5 mg/day; 5 mg Mon & Wed Pt reports having increased vit K in diet this week-- she ate 2 servings of tossed salad 2 nights ago when she had steak for dinner. INR just slightly below goal.  No need for dose change. Pt will try to eat fewer greens or have only 1 serving of salad when she has a salad. Repeat INR in 2 weeks w/ standing lab.  She will see MD & [place] same day. Marily Lente, Pharm.D.

## 2012-07-17 NOTE — Progress Notes (Signed)
Faxed Disability paper to [facility] Benefits Counselor @ [employer]  [phone].  Her phone number is [phone].

## 2012-07-29 ENCOUNTER — Ambulatory Visit: Payer: BC Managed Care – PPO | Admitting: Pharmacist

## 2012-07-29 ENCOUNTER — Ambulatory Visit (HOSPITAL_BASED_OUTPATIENT_CLINIC_OR_DEPARTMENT_OTHER): Payer: BC Managed Care – PPO | Admitting: Oncology

## 2012-07-29 ENCOUNTER — Other Ambulatory Visit (HOSPITAL_BASED_OUTPATIENT_CLINIC_OR_DEPARTMENT_OTHER): Payer: BC Managed Care – PPO | Admitting: Lab

## 2012-07-29 ENCOUNTER — Telehealth: Payer: Self-pay | Admitting: Oncology

## 2012-07-29 VITALS — BP 136/79 | HR 71 | Temp 97.6°F | Resp 18 | Ht 65.0 in | Wt 153.8 lb

## 2012-07-29 DIAGNOSIS — C189 Malignant neoplasm of colon, unspecified: Secondary | ICD-10-CM

## 2012-07-29 DIAGNOSIS — G620 Drug-induced polyneuropathy: Secondary | ICD-10-CM

## 2012-07-29 DIAGNOSIS — M79609 Pain in unspecified limb: Secondary | ICD-10-CM

## 2012-07-29 DIAGNOSIS — I82409 Acute embolism and thrombosis of unspecified deep veins of unspecified lower extremity: Secondary | ICD-10-CM

## 2012-07-29 DIAGNOSIS — Z86718 Personal history of other venous thrombosis and embolism: Secondary | ICD-10-CM

## 2012-07-29 LAB — CBC WITH DIFFERENTIAL/PLATELET
Eosinophils Absolute: 0.2 10*3/uL (ref 0.0–0.5)
MONO#: 0.5 10*3/uL (ref 0.1–0.9)
NEUT#: 1.7 10*3/uL (ref 1.5–6.5)
Platelets: 188 10*3/uL (ref 145–400)
RBC: 3.64 10*6/uL — ABNORMAL LOW (ref 3.70–5.45)
RDW: 14.8 % — ABNORMAL HIGH (ref 11.2–14.5)
WBC: 5.4 10*3/uL (ref 3.9–10.3)
lymph#: 3 10*3/uL (ref 0.9–3.3)

## 2012-07-29 LAB — PROTIME-INR
INR: 1.4 — ABNORMAL LOW (ref 2.00–3.50)
Protime: 16.8 Seconds — ABNORMAL HIGH (ref 10.6–13.4)

## 2012-07-29 LAB — POCT INR: INR: 1.4

## 2012-07-29 NOTE — Progress Notes (Signed)
   [facility]    OFFICE PROGRESS NOTE   INTERVAL HISTORY:   She returns as scheduled. She completed a final cycle of chemotherapy on 06/30/2012. She has persistent neuropathy symptoms. She complains of pain at the fingers and a cold feeling in the feet. She continues Coumadin anticoagulation. The right neck discomfort has resolved.  Objective:  Vital signs in last 24 hours:  Blood pressure 136/79, pulse 71, temperature 97.6 F (36.4 C), temperature source Oral, resp. rate 18, height 5\' 5"  (1.651 m), weight 153 lb 12.8 oz (69.763 kg).    HEENT: No thrush or ulcers, no fullness at the right neck Resp: Lungs clear bilaterally Cardio: Regular rate and rhythm GI: No hepatomegaly, nontender Vascular: No leg edema  Skin: Mild hyperpigmentation at the hands   Portacath/PICC-without erythema  Lab Results:  Lab Results  Component Value Date   WBC 5.4 07/29/2012   HGB 10.9* 07/29/2012   HCT 34.3* 07/29/2012   MCV 94.2 07/29/2012   PLT 188 07/29/2012   ANC 1.7  PT/INR 1.4  CEA on 06/30/2012-0.7  Medications: I have reviewed the patient's current medications.  Assessment/Plan: 1. 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 Adjuvant FOLFOX chemotherapy was initiated on 01/21/2012. The last cycle was completed on 06/30/2012. 2. Delayed nausea following cycle 2 FOLFOX. Aloxi was added with cycle 3. Persistent delayed nausea. Emend was added with cycle 4. She noted improvement in the nausea following cycle 4. She declined steroid prophylaxis.  3. Tiny lung nodules seen on a CT of the chest 10/19/2011. 4. History of tobacco use 5. Endometrial thickening on CT of the abdomen 10/02/2011. 6. Port-A-Cath placement 01/09/2012. 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  8. Mild thrombocytopenia secondary to chemotherapy-resolved. 9. History of  Neutropenia secondary to chemotherapy 10. Hypokalemia on labs 04/28/2012. She is taking a potassium supplement. Potassium was in the low normal range on 05/26/2012 and 06/09/2012. Normal on 06/30/2012 11. 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 She was initially treated with Lovenox. She is currently on Coumadin. She is followed by the Coumadin clinic. 12. Anemia secondary to chemotherapy-improved  Disposition:  She has completed adjuvant chemotherapy. She remains on Coumadin for treatment of a Port-A-Cath related DVT. The plan is to continue Coumadin for at least 3 months and we will then arrange for Port-A-Cath removal. We will add Neurontin if the finger discomfort persists when she returns next month.  Ms. Criswell will be seen in the Coumadin clinic today. She will return for an office visit in one month.   Thornton Papas, MD  07/29/2012  11:20 AM

## 2012-07-29 NOTE — Progress Notes (Signed)
Pt is slightly subtherapeutic today at 1.4.  No changes to report. Will give a 5 mg tablet today and increase her to MWF 5mg  and 2.5 mg other days. Recheck INR on 08/21/12 with lab at 10:30 am & Coumadin clinic at 10:45 am

## 2012-07-29 NOTE — Telephone Encounter (Signed)
Gave pt appt for lab and ML on March 2014 and flush in 2 weeks

## 2012-07-29 NOTE — Patient Instructions (Signed)
Will give a 5 mg tablet today and increase her to MWF 5mg  and 2.5 mg other days. Recheck INR on 08/21/12 with lab at 10:30 am & Coumadin clinic at 10:45 am

## 2012-08-01 ENCOUNTER — Encounter (INDEPENDENT_AMBULATORY_CARE_PROVIDER_SITE_OTHER): Payer: Self-pay | Admitting: Surgery

## 2012-08-01 ENCOUNTER — Ambulatory Visit (INDEPENDENT_AMBULATORY_CARE_PROVIDER_SITE_OTHER): Payer: BC Managed Care – PPO | Admitting: Surgery

## 2012-08-01 VITALS — BP 132/68 | HR 78 | Temp 96.4°F | Resp 18 | Ht 65.0 in | Wt 154.8 lb

## 2012-08-01 DIAGNOSIS — Z85038 Personal history of other malignant neoplasm of large intestine: Secondary | ICD-10-CM

## 2012-08-01 NOTE — Progress Notes (Signed)
Subjective:     Patient ID: [name], female   DOB: 02-24-1946, 67 y.o.   MRN: [id number]  HPI    Review of Systems  Constitutional: Negative.   HENT: Negative.   Cardiovascular: Negative.   Gastrointestinal: Negative.    Patient returns for followup of her stage III colon cancer resected in June of 2013. 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 She continues on Coumadin for her DVT.    Objective:   Physical Exam  Constitutional: She appears well-developed and well-nourished.  HENT:  Head: Normocephalic and atraumatic.  Eyes: EOM are normal.  Neck: Normal range of motion. Neck supple.  Abdominal: She exhibits no ascites and no mass. There is no tenderness. There is no rebound. No hernia.         Assessment:     1. 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 Adjuvant FOLFOX chemotherapy was initiated on 01/21/2012. The last cycle was completed on 06/30/2012. 2. Delayed nausea following cycle 2 FOLFOX. Aloxi was added with cycle 3. Persistent delayed nausea. Emend was added with cycle 4. She noted improvement in the nausea following cycle 4. She declined steroid prophylaxis.  3. Tiny lung nodules seen on a CT of the chest 10/19/2011. 4. History of tobacco use 5. Endometrial thickening on CT of the abdomen 10/02/2011. 6. Port-A-Cath placement 01/09/2012. 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  8. Mild thrombocytopenia secondary to chemotherapy-resolved. 9. History of Neutropenia secondary to chemotherapy 10. Hypokalemia on labs 04/28/2012. She is taking a potassium supplement. Potassium was in the low normal range on 05/26/2012 and 06/09/2012. Normal on 06/30/2012 11. 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 She was initially treated with Lovenox. She is currently on Coumadin. She  is followed by the Coumadin clinic. 12. Anemia secondary to chemotherapy-improved       Plan:     Return 11/2012

## 2012-08-01 NOTE — Patient Instructions (Signed)
Return 4 months

## 2012-08-12 ENCOUNTER — Ambulatory Visit (HOSPITAL_BASED_OUTPATIENT_CLINIC_OR_DEPARTMENT_OTHER): Payer: BC Managed Care – PPO

## 2012-08-12 VITALS — BP 115/74 | HR 78 | Temp 98.4°F

## 2012-08-12 DIAGNOSIS — Z452 Encounter for adjustment and management of vascular access device: Secondary | ICD-10-CM

## 2012-08-12 DIAGNOSIS — C189 Malignant neoplasm of colon, unspecified: Secondary | ICD-10-CM

## 2012-08-12 MED ORDER — SODIUM CHLORIDE 0.9 % IJ SOLN
10.0000 mL | INTRAMUSCULAR | Status: DC | PRN
  Administered 2012-08-12: 10 mL via INTRAVENOUS
  Filled 2012-08-12: qty 10

## 2012-08-12 MED ORDER — HEPARIN SOD (PORK) LOCK FLUSH 100 UNIT/ML IV SOLN
500.0000 [IU] | Freq: Once | INTRAVENOUS | Status: AC
  Administered 2012-08-12: 500 [IU] via INTRAVENOUS
  Filled 2012-08-12: qty 5

## 2012-08-21 ENCOUNTER — Other Ambulatory Visit (HOSPITAL_BASED_OUTPATIENT_CLINIC_OR_DEPARTMENT_OTHER): Payer: BC Managed Care – PPO | Admitting: Lab

## 2012-08-21 ENCOUNTER — Ambulatory Visit (HOSPITAL_BASED_OUTPATIENT_CLINIC_OR_DEPARTMENT_OTHER): Payer: BC Managed Care – PPO | Admitting: Pharmacist

## 2012-08-21 DIAGNOSIS — I82409 Acute embolism and thrombosis of unspecified deep veins of unspecified lower extremity: Secondary | ICD-10-CM

## 2012-08-21 LAB — PROTIME-INR
INR: 1.5 — ABNORMAL LOW (ref 2.00–3.50)
Protime: 18 Seconds — ABNORMAL HIGH (ref 10.6–13.4)

## 2012-08-21 LAB — POCT INR: INR: 1.5

## 2012-08-21 NOTE — Progress Notes (Signed)
Pt has no changes to report.  She remains subtherapeutic today at 1.5.  No signs of clot. She has not missed any doses.   Will change dose to 5 mg daily. Recheck INR on 08/28/12 with lab at 10:00 am, Coumadin clinic at 10:15 am and Lonna Cobb at 10:15.

## 2012-08-21 NOTE — Patient Instructions (Addendum)
Will change dose to 5 mg daily. Recheck INR on 08/28/12 with lab at 10:00 am, Coumadin clinic at 10:15 am and Lonna Cobb at 10:15.

## 2012-08-26 ENCOUNTER — Telehealth: Payer: Self-pay | Admitting: Oncology

## 2012-08-26 ENCOUNTER — Ambulatory Visit (HOSPITAL_BASED_OUTPATIENT_CLINIC_OR_DEPARTMENT_OTHER): Payer: BC Managed Care – PPO | Admitting: Nurse Practitioner

## 2012-08-26 ENCOUNTER — Ambulatory Visit (HOSPITAL_BASED_OUTPATIENT_CLINIC_OR_DEPARTMENT_OTHER): Payer: BC Managed Care – PPO | Admitting: Lab

## 2012-08-26 ENCOUNTER — Ambulatory Visit: Payer: BC Managed Care – PPO | Admitting: Pharmacist

## 2012-08-26 VITALS — BP 124/74 | HR 79 | Temp 97.3°F | Resp 20 | Ht 65.0 in | Wt 161.8 lb

## 2012-08-26 DIAGNOSIS — I82C19 Acute embolism and thrombosis of unspecified internal jugular vein: Secondary | ICD-10-CM

## 2012-08-26 NOTE — Progress Notes (Signed)
OFFICE PROGRESS NOTE  Interval history:  [name] returns as scheduled. She has persistent numbness in the fingertips with associated "achy" pain. Her feet feel cold. She has a good appetite and good energy level. No abdominal pain. Bowels moving regularly. No nausea or vomiting.  She continues Coumadin anticoagulation. Current dose is 5 mg daily.   Objective: Blood pressure 124/74, pulse 79, temperature 97.3 F (36.3 C), temperature source Oral, resp. rate 20, height 5\' 5"  (1.651 m), weight 161 lb 12.8 oz (73.392 kg).  Oropharynx is without thrush or ulceration. No palpable cervical, supraclavicular, axillary or inguinal lymph nodes. Lungs are clear. Regular cardiac rhythm. Port-A-Cath site is without erythema. Abdomen is soft and nontender. No hepatomegaly. Extremities are without edema. Mild decrease in vibratory sense over the fingertips per tuning fork exam.  Lab Results: Lab Results  Component Value Date   WBC 5.4 07/29/2012   HGB 10.9* 07/29/2012   HCT 34.3* 07/29/2012   MCV 94.2 07/29/2012   PLT 188 07/29/2012    Chemistry:    Chemistry      Component Value Date/Time   NA 139 06/30/2012 0924   NA 139 02/04/2012 1049   K 4.0 06/30/2012 0924   K 3.4* 02/04/2012 1049   CL 104 06/30/2012 0924   CL 104 02/04/2012 1049   CO2 27 06/30/2012 0924   CO2 27 02/04/2012 1049   BUN 7.0 06/30/2012 0924   BUN 8 02/04/2012 1049   CREATININE 0.6 06/30/2012 0924   CREATININE 0.52 02/04/2012 1049      Component Value Date/Time   CALCIUM 9.6 06/30/2012 0924   CALCIUM 9.0 02/04/2012 1049   ALKPHOS 110 06/30/2012 0924   ALKPHOS 75 02/04/2012 1049   AST 22 06/30/2012 0924   AST 16 02/04/2012 1049   ALT 9 06/30/2012 0924   ALT 10 02/04/2012 1049   BILITOT 0.39 06/30/2012 0924   BILITOT 0.2* 02/04/2012 1049       Studies/Results: No results found.  Medications: I have reviewed the patient's current medications.  Assessment/Plan:  1. 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 Adjuvant FOLFOX chemotherapy was initiated on 01/21/2012. The last cycle was completed on 06/30/2012. 2. Delayed nausea following cycle 2 FOLFOX. Aloxi was added with cycle 3. Persistent delayed nausea. Emend was added with cycle 4. She noted improvement in the nausea following cycle 4. She declined steroid prophylaxis.  3. Tiny lung nodules seen on a CT of the chest 10/19/2011. 4. History of tobacco use 5. Endometrial thickening on CT of the abdomen 10/02/2011. 6. Port-A-Cath placement 01/09/2012. 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  8. Mild thrombocytopenia secondary to chemotherapy-resolved. 9. History of neutropenia secondary to chemotherapy 10. Hypokalemia on labs 04/28/2012. She is taking a potassium supplement. Potassium was in the low normal range on 05/26/2012 and 06/09/2012. Normal on 06/30/2012. 11. 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 She was initially treated with Lovenox. She is currently on Coumadin. She is followed by the Coumadin Clinic. 12. Anemia secondary to chemotherapy-improved on 07/29/2012.  Disposition-Ms. Maris appears stable. She continues to have numbness/pain in the fingertips. We discussed a trial of Neurontin. At this time she declines Neurontin.  She continues Coumadin for the Port-A-Cath related DVT. A 3 month course of Coumadin is planned.  She will return for a followup visit on 09/19/2012. If she is doing well at that time Coumadin will be  discontinued and we will refer her for removal of the Port-A-Cath.  She will contact the office prior to her next visit with any problems.  Plan reviewed with Dr. Derenda Fennel, Misty Stanley ANP/GNP-BC

## 2012-08-26 NOTE — Progress Notes (Signed)
INR = 2 on Coumadin 5 mg daily.  She has taken 5 days at this dose. No sxs or complaints re: anticoag.  She is anxious to come off Coumadin. The plan per Dr Truett Perna is to continue Coumadin for at least 3 months and then Port-A-Cath will be removed.  INR at goal.  No change to Coumadin dose. Return in 10 days for protime check. Marily Lente, Pharm.D.

## 2012-08-26 NOTE — Telephone Encounter (Signed)
Gave pt appt for lab and MD on March coumadin on 3/14 with labs

## 2012-08-28 ENCOUNTER — Ambulatory Visit: Payer: BC Managed Care – PPO

## 2012-08-28 ENCOUNTER — Other Ambulatory Visit: Payer: BC Managed Care – PPO | Admitting: Lab

## 2012-09-05 ENCOUNTER — Ambulatory Visit (HOSPITAL_BASED_OUTPATIENT_CLINIC_OR_DEPARTMENT_OTHER): Payer: BC Managed Care – PPO | Admitting: Pharmacist

## 2012-09-05 ENCOUNTER — Other Ambulatory Visit (HOSPITAL_BASED_OUTPATIENT_CLINIC_OR_DEPARTMENT_OTHER): Payer: BC Managed Care – PPO | Admitting: Lab

## 2012-09-05 LAB — PROTIME-INR

## 2012-09-05 LAB — POCT INR: INR: 1.5

## 2012-09-05 NOTE — Progress Notes (Signed)
INR = 1.5 on coumadin 5 mg daily Pt did not take her Coumadin on Mon or Tues because she forgot to pick up her refill. Pt c/o L external ear pain today.  She has been putting Vasoline on the outside of her ear due to dryness.  She has scratched it & it bled yesterday. INR subtherapeutic due to missed doses. Take 7.5 mg today (she already took 5 mg this AM so she'll take another 1/2 tab when she gets home) then 5 mg/day (usual dose). Return in 2 weeks.  At that visit, she may come off of her Coumadin per Dr. Truett Perna.  We can see her on 3/28 after she sees Dr. Truett Perna unless he stops her Coumadin.  Pt aware of plan & is anxious to stop her Coumadin. Ebony Hail, Pharm.D., CPP 09/05/2012@11 :38 AM

## 2012-09-08 ENCOUNTER — Encounter: Payer: Self-pay | Admitting: Oncology

## 2012-09-08 NOTE — Progress Notes (Signed)
Put disability form on nurse's desk. °

## 2012-09-19 ENCOUNTER — Ambulatory Visit (HOSPITAL_BASED_OUTPATIENT_CLINIC_OR_DEPARTMENT_OTHER): Payer: BC Managed Care – PPO | Admitting: Oncology

## 2012-09-19 ENCOUNTER — Other Ambulatory Visit (HOSPITAL_BASED_OUTPATIENT_CLINIC_OR_DEPARTMENT_OTHER): Payer: BC Managed Care – PPO | Admitting: Lab

## 2012-09-19 ENCOUNTER — Telehealth: Payer: Self-pay | Admitting: Oncology

## 2012-09-19 ENCOUNTER — Ambulatory Visit (HOSPITAL_BASED_OUTPATIENT_CLINIC_OR_DEPARTMENT_OTHER): Payer: BC Managed Care – PPO | Admitting: Pharmacist

## 2012-09-19 VITALS — BP 127/81 | HR 66 | Temp 98.4°F | Resp 18 | Ht 65.0 in | Wt 161.3 lb

## 2012-09-19 DIAGNOSIS — C189 Malignant neoplasm of colon, unspecified: Secondary | ICD-10-CM

## 2012-09-19 DIAGNOSIS — D6481 Anemia due to antineoplastic chemotherapy: Secondary | ICD-10-CM

## 2012-09-19 DIAGNOSIS — E876 Hypokalemia: Secondary | ICD-10-CM

## 2012-09-19 DIAGNOSIS — I82401 Acute embolism and thrombosis of unspecified deep veins of right lower extremity: Secondary | ICD-10-CM

## 2012-09-19 DIAGNOSIS — I82C19 Acute embolism and thrombosis of unspecified internal jugular vein: Secondary | ICD-10-CM

## 2012-09-19 DIAGNOSIS — R110 Nausea: Secondary | ICD-10-CM

## 2012-09-19 LAB — PROTIME-INR: INR: 2.5 (ref 2.00–3.50)

## 2012-09-19 LAB — BASIC METABOLIC PANEL (CC13)
CO2: 24 mEq/L (ref 22–29)
Calcium: 9.3 mg/dL (ref 8.4–10.4)
Creatinine: 0.7 mg/dL (ref 0.6–1.1)

## 2012-09-19 NOTE — Progress Notes (Signed)
   [facility]    OFFICE PROGRESS NOTE   INTERVAL HISTORY:   She returns as scheduled. She complains of neuropathy symptoms in the hands and feet. No other complaint. She continues Coumadin anticoagulation. She would like to have the Port-A-Cath removed as soon as possible.  Objective:  Vital signs in last 24 hours:  Blood pressure 127/81, pulse 66, temperature 98.4 F (36.9 C), temperature source Oral, resp. rate 18, height 5\' 5"  (1.651 m), weight 161 lb 4.8 oz (73.165 kg).    HEENT: Neck without mass Lymphatics: No cervical, supraclavicular, axillary, or inguinal nodes Resp: Lungs clear bilaterally, distant breath sound Cardio: Regular rate and rhythm GI: No hepatomegaly, nontender Vascular: No leg edema   Portacath/PICC-without erythema  Lab Results:  PT/INR 2.5  CEA on 06/30/2012-0.7   Medications: I have reviewed the patient's current medications.  Assessment/Plan: 1. 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 Adjuvant FOLFOX chemotherapy was initiated on 01/21/2012. The last cycle was completed on 06/30/2012. 2. Delayed nausea following cycle 2 FOLFOX. Aloxi was added with cycle 3. Persistent delayed nausea. Emend was added with cycle 4. She noted improvement in the nausea following cycle 4. She declined steroid prophylaxis.  3. Tiny lung nodules seen on a CT of the chest 10/19/2011. 4. History of tobacco use 5. Endometrial thickening on CT of the abdomen 10/02/2011. 6. Port-A-Cath placement 01/09/2012. 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  8. Mild thrombocytopenia secondary to chemotherapy-resolved. 9. History of neutropenia secondary to chemotherapy 10. Hypokalemia on labs 04/28/2012. She is taking a potassium supplement. Potassium was in the low normal range on 05/26/2012 and 06/09/2012.  Normal on 06/30/2012. 11. 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 She was initially treated with Lovenox. She is currently on Coumadin. She is followed by the Coumadin Clinic. 12. Anemia secondary to chemotherapy-improved on 07/29/2012.   Disposition:  She remains in clinical remission from colon cancer. She would like to have the Port-A-Cath removed as soon as possible. She has completed 3 months of anticoagulation. We will schedule restaging CT scans within the next one to 2 weeks. If the CT scans are negative we will refer her for removal of the Port-A-Cath.  Ms. Lipps will continue Coumadin anticoagulation until 4 days prior to the schedule Port-A-Cath removal. She will return for an office visit and CEA in 3 months. The neuropathy symptoms should improve over time.   Thornton Papas, MD  09/19/2012  1:01 PM

## 2012-09-19 NOTE — Patient Instructions (Addendum)
Continue 5 mg daily Recheck INR on 10/10/12 with lab at 9am and coumadin clinic at 9:15am.  Dr. Truett Perna would like you to stop your coumadin 4 days prior to having your port removed.  Port removal date has not been scheduled. Do not restart your coumadin after the port removal. You will be done with coumadin. Congratulations!!

## 2012-09-19 NOTE — Telephone Encounter (Signed)
Gave pt appt for june 2014 lab and MD , gave oral contrast

## 2012-09-19 NOTE — Progress Notes (Signed)
INR within goal today. No problems to report regarding anticoagulation. No changes in medications, diet, etc. She is going to [employer] for lunch today. Pt is scheduled for CT on 09/30/12. Then she will be scheduled for port removal in the next few weeks. Once port removal date scheduled, pt will stop her coumadin 4 days prior to port removal date. No Lovenox coverage needed per MD. She will NOT restart coumadin after port removal. She will have completed her coumadin therapy. Continue 5 mg daily. Recheck INR on 10/10/12 with lab at 9am and coumadin clinic at 9:15am. We can cancel this appointment if port removal scheduled during April.

## 2012-09-23 ENCOUNTER — Encounter: Payer: Self-pay | Admitting: Oncology

## 2012-09-23 NOTE — Progress Notes (Signed)
Faxed disability form to [facility] foulks @ [employer] [phone].

## 2012-09-30 ENCOUNTER — Ambulatory Visit (HOSPITAL_COMMUNITY)
Admission: RE | Admit: 2012-09-30 | Discharge: 2012-09-30 | Disposition: A | Discharge status: Home / Self Care | Payer: BC Managed Care – PPO | Source: Ambulatory Visit | Attending: Oncology | Admitting: Oncology

## 2012-09-30 DIAGNOSIS — N959 Unspecified menopausal and perimenopausal disorder: Secondary | ICD-10-CM | POA: Insufficient documentation

## 2012-09-30 DIAGNOSIS — K7689 Other specified diseases of liver: Secondary | ICD-10-CM | POA: Insufficient documentation

## 2012-09-30 DIAGNOSIS — Z09 Encounter for follow-up examination after completed treatment for conditions other than malignant neoplasm: Secondary | ICD-10-CM | POA: Insufficient documentation

## 2012-09-30 DIAGNOSIS — Z9049 Acquired absence of other specified parts of digestive tract: Secondary | ICD-10-CM | POA: Insufficient documentation

## 2012-09-30 DIAGNOSIS — K449 Diaphragmatic hernia without obstruction or gangrene: Secondary | ICD-10-CM | POA: Insufficient documentation

## 2012-09-30 DIAGNOSIS — Z85038 Personal history of other malignant neoplasm of large intestine: Secondary | ICD-10-CM | POA: Insufficient documentation

## 2012-09-30 DIAGNOSIS — R918 Other nonspecific abnormal finding of lung field: Secondary | ICD-10-CM | POA: Insufficient documentation

## 2012-09-30 DIAGNOSIS — Z9089 Acquired absence of other organs: Secondary | ICD-10-CM | POA: Insufficient documentation

## 2012-09-30 DIAGNOSIS — K5710 Diverticulosis of small intestine without perforation or abscess without bleeding: Secondary | ICD-10-CM | POA: Insufficient documentation

## 2012-09-30 DIAGNOSIS — C189 Malignant neoplasm of colon, unspecified: Secondary | ICD-10-CM

## 2012-09-30 MED ORDER — IOHEXOL 300 MG/ML  SOLN
100.0000 mL | Freq: Once | INTRAMUSCULAR | Status: AC | PRN
  Administered 2012-09-30: 100 mL via INTRAVENOUS

## 2012-10-10 ENCOUNTER — Ambulatory Visit (HOSPITAL_BASED_OUTPATIENT_CLINIC_OR_DEPARTMENT_OTHER): Payer: BC Managed Care – PPO | Admitting: Pharmacist

## 2012-10-10 ENCOUNTER — Other Ambulatory Visit (HOSPITAL_BASED_OUTPATIENT_CLINIC_OR_DEPARTMENT_OTHER): Payer: BC Managed Care – PPO | Admitting: Lab

## 2012-10-10 DIAGNOSIS — I82401 Acute embolism and thrombosis of unspecified deep veins of right lower extremity: Secondary | ICD-10-CM

## 2012-10-10 DIAGNOSIS — I82409 Acute embolism and thrombosis of unspecified deep veins of unspecified lower extremity: Secondary | ICD-10-CM

## 2012-10-10 LAB — PROTIME-INR
INR: 1.2 — ABNORMAL LOW (ref 2.00–3.50)
Protime: 14.4 Seconds — ABNORMAL HIGH (ref 10.6–13.4)

## 2012-10-10 NOTE — Progress Notes (Signed)
INR below goal today due to patient running out of coumadin this week while out of town. Pt had forgotten to refill. Pt has missed ~ 5 doses. No other issues or complaints, no arm or leg swelling. No new medications. Provided 5mg  tablet samples (10) for patient to have incase this issue arises again. Instructed patient on the importance of not missing/running out of medication and to take everyday. She voiced understanding and will pick refills up at [facility]. Instructed patient to call pharmacy at [facility] if she ever runs out of medication and is unable to pick up so we can assist so she does not go almost a week without medication. Pt will continue 5 mg daily and return in 2 weeks on 10/24/12 at 9am for lab.

## 2012-10-10 NOTE — Patient Instructions (Addendum)
INR below goal due to being without coumadin this week  Samples of 5 mg tablets provided  Please try to pick up coumadin from [facility] next week, if unable to please call coumadin clinic at [phone] or the cancer center pharmacy at [phone]  Restart coumadin 5 mg daily  Return in 2 weeks on 10/24/12 at 9am

## 2012-10-15 ENCOUNTER — Encounter: Payer: Self-pay | Admitting: Oncology

## 2012-10-15 NOTE — Progress Notes (Signed)
Faxed disability form to [facility] @ [employer] @ [phone].

## 2012-10-22 ENCOUNTER — Other Ambulatory Visit: Payer: Self-pay | Admitting: Nurse Practitioner

## 2012-10-22 DIAGNOSIS — C189 Malignant neoplasm of colon, unspecified: Secondary | ICD-10-CM

## 2012-10-24 ENCOUNTER — Ambulatory Visit: Payer: BC Managed Care – PPO

## 2012-10-24 ENCOUNTER — Other Ambulatory Visit: Payer: BC Managed Care – PPO | Admitting: Lab

## 2012-10-28 ENCOUNTER — Ambulatory Visit (HOSPITAL_BASED_OUTPATIENT_CLINIC_OR_DEPARTMENT_OTHER): Payer: BC Managed Care – PPO | Admitting: Pharmacist

## 2012-10-28 ENCOUNTER — Other Ambulatory Visit (HOSPITAL_BASED_OUTPATIENT_CLINIC_OR_DEPARTMENT_OTHER): Payer: BC Managed Care – PPO | Admitting: Lab

## 2012-10-28 DIAGNOSIS — I82409 Acute embolism and thrombosis of unspecified deep veins of unspecified lower extremity: Secondary | ICD-10-CM

## 2012-10-28 DIAGNOSIS — I82401 Acute embolism and thrombosis of unspecified deep veins of right lower extremity: Secondary | ICD-10-CM

## 2012-10-28 LAB — PROTIME-INR: INR: 1.6 — ABNORMAL LOW (ref 2.00–3.50)

## 2012-10-28 LAB — POCT INR: INR: 1.6

## 2012-10-28 NOTE — Progress Notes (Signed)
INR below goal of 2-3.  [name] states that she missed 1 dose of coumadin in the past week, but has also ran out of tablets.  Unsure how many doses of coumadin she has missed.  No other changes to report.  She has appt with surgeon on 12/08/12.  Will need to f/u plans to take port out at this time.  Can stop coumadin after port out.  Will continue coumadin 5mg  daily since compliance has been a issue.  Coumadin 5mg  #30 samples given to [name].  Next PT/INR on 5/20 at 0915.

## 2012-11-11 ENCOUNTER — Ambulatory Visit: Payer: BC Managed Care – PPO | Admitting: Pharmacist

## 2012-11-11 ENCOUNTER — Encounter: Payer: Self-pay | Admitting: *Deleted

## 2012-11-11 ENCOUNTER — Other Ambulatory Visit (HOSPITAL_BASED_OUTPATIENT_CLINIC_OR_DEPARTMENT_OTHER): Payer: BC Managed Care – PPO | Admitting: Lab

## 2012-11-11 DIAGNOSIS — I82401 Acute embolism and thrombosis of unspecified deep veins of right lower extremity: Secondary | ICD-10-CM

## 2012-11-11 DIAGNOSIS — I82409 Acute embolism and thrombosis of unspecified deep veins of unspecified lower extremity: Secondary | ICD-10-CM

## 2012-11-11 LAB — PROTIME-INR
INR: 2 (ref 2.00–3.50)
Protime: 24 Seconds — ABNORMAL HIGH (ref 10.6–13.4)

## 2012-11-11 NOTE — Progress Notes (Signed)
INR right at lower end of goal of 2-3 on Coumadin 5mg  daily.  No changes in meds.  [name] not aware of any plans to get port out, so will discuss with Dr. Truett Perna and set this up.  Per previous note from Dr. Truett Perna, can stop anticoag after port is out.  Will continue Coumadin 5mg  daily and check PT/INR in 1 month.  Same day as appt with Lonna Cobb, NP.

## 2012-11-11 NOTE — Progress Notes (Signed)
Requesting PAC removal. Was placed by Dr. Luisa Hart. Made Dr. Truett Perna aware of request. She continues on warfarin anticoagulation per coumadin clinic.

## 2012-12-02 ENCOUNTER — Telehealth: Payer: Self-pay | Admitting: *Deleted

## 2012-12-02 NOTE — Telephone Encounter (Signed)
Per Dr. Truett Perna : CT is without evidence of colon cancer. She does need to follow up with GYN to evaluate the endometrial thickening seen on CT. OK to refer to Dr. Luisa Hart to remove PAC. Left VM for patient to return call to office.

## 2012-12-03 ENCOUNTER — Telehealth: Payer: Self-pay | Admitting: *Deleted

## 2012-12-03 DIAGNOSIS — C189 Malignant neoplasm of colon, unspecified: Secondary | ICD-10-CM

## 2012-12-03 NOTE — Telephone Encounter (Signed)
Per Dr. Truett Perna: OK to discontinue Potassium. Check BMET next visit here. Will make referral to Dr. Seymour Bars. Left message on voicemail for pt to call office.

## 2012-12-03 NOTE — Telephone Encounter (Addendum)
Made patient aware of CT results-OK per Dr. Truett Perna to have PAC removed. She sees Dr. Luisa Hart next week. Made her aware of need for GYN follow up for endometrial thickening that was noted on scan. She reports she does not have a GYN and has not had pelvic exam in many years. Wishes Dr. Truett Perna to refer her to one. Would like to be seen in late July after returns from family trip. Also asking if she needs to continue to take her K+ daily (has #2 refills, but says she can't afford it-costs $12-doesn't like to ask her children for money).

## 2012-12-08 ENCOUNTER — Encounter (INDEPENDENT_AMBULATORY_CARE_PROVIDER_SITE_OTHER): Payer: Self-pay | Admitting: Surgery

## 2012-12-08 ENCOUNTER — Ambulatory Visit (INDEPENDENT_AMBULATORY_CARE_PROVIDER_SITE_OTHER): Payer: BC Managed Care – PPO | Admitting: Surgery

## 2012-12-08 VITALS — BP 114/68 | HR 76 | Temp 96.8°F | Resp 15 | Ht 65.0 in | Wt 164.6 lb

## 2012-12-08 DIAGNOSIS — Z85038 Personal history of other malignant neoplasm of large intestine: Secondary | ICD-10-CM

## 2012-12-08 NOTE — Patient Instructions (Signed)
Return in November.  Will consider port removal at that point.  STOP SMOKING.

## 2012-12-08 NOTE — Progress Notes (Signed)
Subjective:     Patient ID: [name], female   DOB: [birth year], 67 y.o.   MRN: [id number] Patient returns for followup of her stage III colon cancer resected in June of 2013. 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 She continues on Coumadin for her DVT. She would like to have her Port-A-Cath removed. She finished therapy in January. She is completing her Coumadin for DVT. She is in good spirits. Denies abdominal pain. Tolerating her diet. No evidence of recurrent cancer she is one year out. She will need a colonoscopy soon.   Review of Systems  Constitutional: Negative.   HENT: Negative.   Cardiovascular: Negative.   Gastrointestinal: Negative.    Patient returns for followup of her stage III colon cancer resected in June of 2013. 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 She continues on Coumadin for her DVT.    Objective:   Physical Exam  Constitutional: She appears well-developed and well-nourished.  HENT:  Head: Normocephalic and atraumatic.  Eyes: EOM are normal.  Neck: Normal range of motion. Neck supple.  Abdominal: She exhibits no ascites and no mass. There is no tenderness. There is no rebound. No hernia.    Clinical Data: History of colon cancer. Restaging scan.  CT CHEST, ABDOMEN AND PELVIS WITH CONTRAST  Technique: Multidetector CT imaging of the chest, abdomen and  pelvis was performed following the standard protocol during bolus  administration of intravenous contrast.  Contrast: OMNIPAQUE IOHEXOL 300 MG/ML SOLN  Comparison: CT of the chest 10/19/2011. CT of the abdomen and  pelvis 10/02/2011.  CT CHEST  Findings:  Mediastinum: Enlarged and heterogeneous appearing left lobe of  thyroid gland, similar to prior examinations, favored to represent  a goiter. The right lobe of the gland and isthmus are not  visualized, and may be surgically resected. No pathologically  enlarged  mediastinal or hilar lymph nodes. Heart size is normal.  There is no significant pericardial fluid, thickening or  pericardial calcification. A small hiatal hernia. Right internal  jugular single lumen Port-A-Cath with tip terminating in the right  atrium.  Lungs/Pleura: Multiple small pulmonary nodules appear similar in  size, number and distribution. Specifically, there is a 4 mm  nodule in the apex of the right upper lobe (image 10 of series 5),  a 4 mm nodule the periphery of the left upper lobe (image 22 of  series 5), and a 5 mm nodule in the posterior aspect of the right  lower lobe (image 33 of series 5). No larger more suspicious  appearing pulmonary nodules or masses are otherwise noted. No  acute consolidative air space disease. No pleural effusions.  Musculoskeletal: There are no aggressive appearing lytic or blastic  lesions noted in the visualized portions of the skeleton.  IMPRESSION:  1. No definite findings to suggest metastatic disease to the  thorax on today's examination.  2. Multiple previously noted small pulmonary nodules appear  unchanged in size, number and distribution compared to the prior  examination, as above. Given their stability in size over the  prior 1-year time interval, these are favored to be benign, but  continued attention on future follow up studies until full 2 years  of stability has been documented is recommended.  3. Additional incidental findings, as above.  CT ABDOMEN AND PELVIS  Findings:  Abdomen/Pelvis: A 1.8 cm lesion with peripheral nodular enhancement  in segment 8 of the liver is unchanged, most  compatible with a  cavernous hemangioma. 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 Multiple other small low  attenuation hepatic lesions are generally too small to  characterize, with exception of a 1.5 cm low attenuation lesion in  segment  two of the liver which is compatible with a simple cyst.  No other lesions are favored to be a small cyst or biliary  hamartomas as they are unchanged compared to the prior examination.  No definite new enlarging suspicious appearing hepatic lesion is  otherwise noted to suggest the presence of metastatic disease to  the liver at this time.  Status post cholecystectomy. Large duodenal diverticulum from the  second portion of the duodenum. The appearance of the pancreas is  unremarkable. A 9 mm low attenuation lesion in the spleen is  unchanged and presumably benign. The appearance of the adrenal  glands and the left kidney is unremarkable. Small parenchymal  defect in the posterior aspect of the upper pole of the right  kidney is most compatible with cortical scarring. There are also  several subcentimeter low attenuation lesions in the right kidney  which are too small to definitively characterize, but are favored  to represent small cysts as they are unchanged compared to the  prior examination. Normal appendix. Numerous colonic diverticula,  without surrounding inflammatory changes to suggest acute  diverticulitis at this time. Postoperative changes of partial  colectomy are noted in the region of the splenic flexure of the  colon. No definite soft tissue mass is identified adjacent to the  suture line to suggest local recurrence of disease. No significant  volume of ascites. No pneumoperitoneum. No pathologic distension  of small bowel. No definite pathologic lymphadenopathy identified  within the abdomen or pelvis. Thickening of the endometrial canal  which appears to measure up to 2.3 cm in the uterine fundus, highly  unusual in a post menopausal female, and increased compared to the  prior study. Urinary bladder is unremarkable in appearance.  Musculoskeletal: There are no aggressive appearing lytic or blastic  lesions noted in the visualized portions of the skeleton.    IMPRESSION:  1. Status post partial colectomy in the region of the splenic  flexure, without evidence to suggest local recurrence of disease or  an new metastatic disease in the abdomen or pelvis.  2. However, there is worsening thickening of the endometrium in  the fundal portion of the uterus which is abnormal in a post  menopausal female. Correlation with transvaginal ultrasound is  highly recommended to exclude the presence of a developing  endometrial malignancy.  3. Numerous liver lesions appear unchanged compared to the prior  examination and appear to represent a combination of small cysts  and/or hemangiomas.  4. Small low attenuation lesions in the right kidney are also  unchanged, and although too small to definitively characterize,  these are statistically favored to represent small cysts.  5. Mild colonic diverticulosis without evidence to suggest acute  diverticulitis at this time.  6. Normal appendix.  7. Additional incidental findings, as above.  Original Report Authenticated By: Trudie Reed, M.D.      Assessment:     1. 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 Adjuvant FOLFOX chemotherapy was initiated on 01/21/2012. The last cycle was completed on 06/30/2012. 2. Delayed nausea following cycle 2 FOLFOX. Aloxi was added with cycle 3. Persistent delayed  nausea. Emend was added with cycle 4. She noted improvement in the nausea following cycle 4. She declined steroid prophylaxis.  3. Tiny lung nodules seen on a CT of the chest 10/19/2011. 4. History of tobacco use 5. Endometrial thickening on CT of the abdomen 10/02/2011. 6. Port-A-Cath placement 01/09/2012. 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  8. Mild thrombocytopenia secondary to chemotherapy-resolved. 9. History of Neutropenia secondary to chemotherapy 10. Hypokalemia on  labs 04/28/2012. She is taking a potassium supplement. Potassium was in the low normal range on 05/26/2012 and 06/09/2012. Normal on 06/30/2012 11. 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 She was initially treated with Lovenox. She is currently on Coumadin. She is followed by the Coumadin clinic. 12. Anemia secondary to chemotherapy-improved       Plan:     Discuss Port-A-Cath removal. This was  placed in  interventional radiology. I do have concerns she may recur and feel the port to be in place until the end of the year. She'll complete coumadin  therapy by then. We discussed Port-A-Cath removal. We discussed her financial situation since she has not been paying her co-pays for visits. She states she's getting her financial affairs in order and this should resolve in the next few months. I explained that  We can schedule no further surgery that is elective until her she resolves this. She would like a copy of her balance and we will provide that as well as any assistance in helping her resolve this matter. She will return to see me in November at which time this can be discussed.Marland Kitchen

## 2012-12-18 ENCOUNTER — Telehealth: Payer: Self-pay | Admitting: Oncology

## 2012-12-18 NOTE — Telephone Encounter (Signed)
Called pt and left message regerding appt with Dr. Seymour Bars Ob-Gyn on 01/02/13 11:45, gave referrall to HIM to fax notes

## 2012-12-19 ENCOUNTER — Ambulatory Visit (HOSPITAL_BASED_OUTPATIENT_CLINIC_OR_DEPARTMENT_OTHER): Payer: BC Managed Care – PPO | Admitting: Nurse Practitioner

## 2012-12-19 ENCOUNTER — Telehealth: Payer: Self-pay | Admitting: Oncology

## 2012-12-19 ENCOUNTER — Other Ambulatory Visit (HOSPITAL_BASED_OUTPATIENT_CLINIC_OR_DEPARTMENT_OTHER): Payer: BC Managed Care – PPO | Admitting: Lab

## 2012-12-19 ENCOUNTER — Ambulatory Visit (HOSPITAL_BASED_OUTPATIENT_CLINIC_OR_DEPARTMENT_OTHER): Payer: BC Managed Care – PPO | Admitting: Pharmacist

## 2012-12-19 ENCOUNTER — Ambulatory Visit (HOSPITAL_BASED_OUTPATIENT_CLINIC_OR_DEPARTMENT_OTHER): Payer: BC Managed Care – PPO | Admitting: Lab

## 2012-12-19 ENCOUNTER — Encounter: Payer: Self-pay | Admitting: Gastroenterology

## 2012-12-19 VITALS — BP 115/76 | HR 65 | Temp 97.2°F | Resp 18 | Ht 65.0 in | Wt 162.3 lb

## 2012-12-19 DIAGNOSIS — C189 Malignant neoplasm of colon, unspecified: Secondary | ICD-10-CM

## 2012-12-19 DIAGNOSIS — I82401 Acute embolism and thrombosis of unspecified deep veins of right lower extremity: Secondary | ICD-10-CM

## 2012-12-19 DIAGNOSIS — I82409 Acute embolism and thrombosis of unspecified deep veins of unspecified lower extremity: Secondary | ICD-10-CM

## 2012-12-19 DIAGNOSIS — Z5181 Encounter for therapeutic drug level monitoring: Secondary | ICD-10-CM

## 2012-12-19 DIAGNOSIS — Z7901 Long term (current) use of anticoagulants: Secondary | ICD-10-CM

## 2012-12-19 LAB — CEA: CEA: 1.2 ng/mL (ref 0.0–5.0)

## 2012-12-19 LAB — BASIC METABOLIC PANEL (CC13)
CO2: 28 mEq/L (ref 22–29)
Calcium: 10 mg/dL (ref 8.4–10.4)
Creatinine: 0.7 mg/dL (ref 0.6–1.1)

## 2012-12-19 LAB — PROTIME-INR: Protime: 19.2 Seconds — ABNORMAL HIGH (ref 10.6–13.4)

## 2012-12-19 NOTE — Patient Instructions (Signed)
Please call office with the name of the gynecologist who evaluated you last year.

## 2012-12-19 NOTE — Progress Notes (Signed)
OFFICE PROGRESS NOTE  Interval history:  [name] returns as scheduled. She continues to have neuropathy symptoms in the hands and feet. She has tingling in the fingertips and her feet feel "cold". She has a good appetite. No abdominal pain. No nausea or vomiting. Bowels moving regularly.  We discussed the finding of progressive thickening of the endometrium on the CT scan done 09/30/2012. She remembers seeing a gynecologist for evaluation of abnormal bleeding and undergoing a biopsy one year ago. She is unable to recall the name of the gynecologist but does remember where the office is.   Objective: Blood pressure 115/76, pulse 65, temperature 97.2 F (36.2 C), temperature source Oral, resp. rate 18, height 5\' 5"  (1.651 m), weight 162 lb 4.8 oz (73.619 kg).  Oropharynx is without thrush or ulceration. No palpable cervical, supraclavicular, axillary or inguinal lymph nodes. Lungs are clear. Regular cardiac rhythm. Port-A-Cath site is without erythema. Abdomen is soft and nontender. Extremities without edema.  Lab Results: Lab Results  Component Value Date   WBC 5.4 07/29/2012   HGB 10.9* 07/29/2012   HCT 34.3* 07/29/2012   MCV 94.2 07/29/2012   PLT 188 07/29/2012    Chemistry:    Chemistry      Component Value Date/Time   NA 143 09/19/2012 1157   NA 139 02/04/2012 1049   K 4.0 09/19/2012 1157   K 3.4* 02/04/2012 1049   CL 110* 09/19/2012 1157   CL 104 02/04/2012 1049   CO2 24 09/19/2012 1157   CO2 27 02/04/2012 1049   BUN 9.1 09/19/2012 1157   BUN 8 02/04/2012 1049   CREATININE 0.7 09/19/2012 1157   CREATININE 0.52 02/04/2012 1049      Component Value Date/Time   CALCIUM 9.3 09/19/2012 1157   CALCIUM 9.0 02/04/2012 1049   ALKPHOS 110 06/30/2012 0924   ALKPHOS 75 02/04/2012 1049   AST 22 06/30/2012 0924   AST 16 02/04/2012 1049   ALT 9 06/30/2012 0924   ALT 10 02/04/2012 1049   BILITOT 0.39 06/30/2012 0924   BILITOT 0.2* 02/04/2012 1049       Studies/Results: No results found.  Medications: I  have reviewed the patient's current medications.  Assessment/Plan:  1. 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 Adjuvant FOLFOX chemotherapy was initiated on 01/21/2012. The last cycle was completed on 06/30/2012. Restaging CT scans 09/30/2012 showed no evidence of recurrent/metastatic disease. 2. Delayed nausea following cycle 2 FOLFOX. Aloxi was added with cycle 3. Persistent delayed nausea. Emend was added with cycle 4. She noted improvement in the nausea following cycle 4. She declined steroid prophylaxis.  3. Tiny lung nodules seen on a CT of the chest 10/19/2011. Stable on CT of the chest 09/30/2012. 4. History of tobacco use 5. Endometrial thickening on CT of the abdomen 10/02/2011. Progressive endometrial thickening on CT 09/30/2012. 6. Port-A-Cath placement 01/09/2012 in interventional radiology. 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  8. Mild thrombocytopenia secondary to chemotherapy-resolved. 9. History of neutropenia secondary to chemotherapy. 10. Hypokalemia on labs 04/28/2012. She is taking a potassium supplement. Potassium was in the low normal range on 05/26/2012 and 06/09/2012. Normal on 06/30/2012. 11. 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 She was initially treated with Lovenox. She is currently on Coumadin. She is followed by the Coumadin Clinic. 12. Anemia secondary to chemotherapy-improved on 07/29/2012.  Disposition-[name] remains in clinical remission from colon  cancer. We will obtain a CEA today and contact her with the result. We made a referral to [name] for a colonoscopy.  We made a referral to interventional radiology for Port-A-Cath removal. She understands to discontinue Coumadin 4 days prior to the removal. She will not resume Coumadin following the procedure.  With regard to  progressive endometrial thickening on the CT scan done in April of this year, a referral has been made to  gynecology. However, at today's visit she reports seeing a gynecologist approximately 1 year ago for abnormal bleeding and undergoing a biopsy. She was unable to recall the gynecologists name while in the office today but plans to drive by the office on her way home and call us back with that information.  She will return for a followup visit in 3 months. She will contact the office prior to that with any problems.  Plan reviewed with Dr. Truett Luna.  [name] ANP/GNP-BC

## 2012-12-19 NOTE — Patient Instructions (Addendum)
INR below goal likely to missing 1 dose  No changes  Continue Coumadin 5 mg daily. Recheck INR on 01/09/13 with lab at 11am and coumadin at 11:15

## 2012-12-19 NOTE — Progress Notes (Addendum)
INR below goal of 2-3. [name] states that she missed 1 dose of coumadin in the past week No other changes to report. Spoke to Dr. Truett Perna and plan is to continue coumadin while port is in (likely not being taken out until Nov 2014). Can stop coumadin after port out. Will continue coumadin 5mg  daily since compliance has been a issue. Coumadin 5mg  #40 samples given to [name] (Lot: [id number], Exp: 08/2013) . Next PT/INR on 7/18 at 11am.   **Addendum**   Spoke to [name], she has scheduled patient for port removal with interventional radiology. She instructed patient to stop coumadin 4 days before procedure and then stop coumadin completely. Patient will no longer require coumadin once Port has been removed

## 2012-12-22 ENCOUNTER — Telehealth: Payer: Self-pay | Admitting: Oncology

## 2012-12-22 NOTE — Telephone Encounter (Signed)
Faxed pt medical records to [facility]

## 2012-12-25 ENCOUNTER — Encounter: Payer: Self-pay | Admitting: Oncology

## 2012-12-25 ENCOUNTER — Other Ambulatory Visit: Payer: Self-pay | Admitting: Radiology

## 2012-12-25 NOTE — Progress Notes (Signed)
Put disability forms on nurse's desk. °

## 2012-12-30 ENCOUNTER — Ambulatory Visit (HOSPITAL_COMMUNITY)
Admission: RE | Admit: 2012-12-30 | Discharge: 2012-12-30 | Disposition: A | Discharge status: Home / Self Care | Payer: BC Managed Care – PPO | Source: Ambulatory Visit | Attending: Oncology | Admitting: Oncology

## 2012-12-30 ENCOUNTER — Encounter: Payer: Self-pay | Admitting: Oncology

## 2012-12-30 ENCOUNTER — Encounter (HOSPITAL_COMMUNITY): Payer: Self-pay

## 2012-12-30 ENCOUNTER — Ambulatory Visit (HOSPITAL_COMMUNITY)
Admission: RE | Admit: 2012-12-30 | Discharge: 2012-12-30 | Disposition: A | Discharge status: Home / Self Care | Payer: BC Managed Care – PPO | Source: Ambulatory Visit | Attending: Nurse Practitioner | Admitting: Nurse Practitioner

## 2012-12-30 DIAGNOSIS — Z87891 Personal history of nicotine dependence: Secondary | ICD-10-CM | POA: Insufficient documentation

## 2012-12-30 DIAGNOSIS — Z9049 Acquired absence of other specified parts of digestive tract: Secondary | ICD-10-CM | POA: Insufficient documentation

## 2012-12-30 DIAGNOSIS — Z86718 Personal history of other venous thrombosis and embolism: Secondary | ICD-10-CM | POA: Insufficient documentation

## 2012-12-30 DIAGNOSIS — C189 Malignant neoplasm of colon, unspecified: Secondary | ICD-10-CM

## 2012-12-30 DIAGNOSIS — Z880 Allergy status to penicillin: Secondary | ICD-10-CM | POA: Insufficient documentation

## 2012-12-30 DIAGNOSIS — Z452 Encounter for adjustment and management of vascular access device: Secondary | ICD-10-CM | POA: Insufficient documentation

## 2012-12-30 HISTORY — DX: Acute embolism and thrombosis of unspecified deep veins of unspecified lower extremity: I82.409

## 2012-12-30 HISTORY — PX: OTHER SURGICAL HISTORY: SHX169

## 2012-12-30 LAB — CBC
HCT: 37.9 % (ref 36.0–46.0)
MCHC: 32.2 g/dL (ref 30.0–36.0)
Platelets: 217 10*3/uL (ref 150–400)
RDW: 14.4 % (ref 11.5–15.5)
WBC: 6.7 10*3/uL (ref 4.0–10.5)

## 2012-12-30 LAB — APTT: aPTT: 30 seconds (ref 24–37)

## 2012-12-30 LAB — PROTIME-INR
INR: 0.95 (ref 0.00–1.49)
Prothrombin Time: 12.5 seconds (ref 11.6–15.2)

## 2012-12-30 MED ORDER — SODIUM CHLORIDE 0.9 % IV SOLN
Freq: Once | INTRAVENOUS | Status: AC
  Administered 2012-12-30: 08:00:00 via INTRAVENOUS

## 2012-12-30 MED ORDER — FENTANYL CITRATE 0.05 MG/ML IJ SOLN
INTRAMUSCULAR | Status: AC
  Filled 2012-12-30: qty 2

## 2012-12-30 MED ORDER — VANCOMYCIN HCL IN DEXTROSE 1-5 GM/200ML-% IV SOLN
1000.0000 mg | Freq: Once | INTRAVENOUS | Status: AC
  Administered 2012-12-30: 1000 mg via INTRAVENOUS
  Filled 2012-12-30: qty 200

## 2012-12-30 MED ORDER — HYDROCODONE-ACETAMINOPHEN 5-325 MG PO TABS: 1.0000 | ORAL_TABLET | ORAL | Status: DC | PRN

## 2012-12-30 MED ORDER — LIDOCAINE HCL 1 % IJ SOLN
INTRAMUSCULAR | Status: AC
  Filled 2012-12-30: qty 20

## 2012-12-30 MED ORDER — MIDAZOLAM HCL 2 MG/2ML IJ SOLN
INTRAMUSCULAR | Status: AC
  Filled 2012-12-30: qty 2

## 2012-12-30 MED ORDER — MIDAZOLAM HCL 2 MG/2ML IJ SOLN
INTRAMUSCULAR | Status: AC | PRN
  Administered 2012-12-30: 2 mg via INTRAVENOUS

## 2012-12-30 MED ORDER — FENTANYL CITRATE 0.05 MG/ML IJ SOLN
INTRAMUSCULAR | Status: AC | PRN
  Administered 2012-12-30: 100 ug via INTRAVENOUS

## 2012-12-30 NOTE — H&P (Signed)
Chief Complaint: Colon cancer Referring Physician:Cornett HPI: [name] is an 67 y.o. female with hx of colon cancer and partial colectomy. She has now completed her chemotherapy and is scheduled for portacath removal. She had no issues with her port, though she did develop a DVT in the Rt IJ and subclavian that she has been on Coumadin for. She has held her Coumadin for the past 4 days, and will no longer require it after today's procedure. She otherwise feels well.  Past Medical History:  Past Medical History  Diagnosis Date  . Kidney stone   . Abdominal pain   . Cancer 11/2011    cancer in colon  . DVT (deep venous thrombosis) 05/2012    (R)IJ and subclavian    Past Surgical History:  Past Surgical History  Procedure Laterality Date  . Cholecystectomy    . Goiter removal     . Kidney stone surgery    . Tonsillectomy      as child  . Colon resection  12/18/2011    Procedure: COLON RESECTION LAPAROSCOPIC;  Surgeon: Clovis Pu. Cornett, MD;  Location: [facility] OR;  Service: General;  Laterality: N/A;  . Portacath placement  6/13    Family History:  Family History  Problem Relation Age of Onset  . Breast cancer Mother   . Heart disease Mother   . Hypertension Other     Social History:  reports that she quit smoking about 7 weeks ago. Her smoking use included Cigarettes. She has a 8.75 pack-year smoking history. She has never used smokeless tobacco. She reports that she does not drink alcohol or use illicit drugs.  Allergies:  Allergies  Allergen Reactions  . Aspirin Nausea And Vomiting  . Advil (Ibuprofen) Palpitations  . Penicillins Rash    Medications: NO regular prescription meds.  Please HPI for pertinent positives, otherwise complete 10 system ROS negative.  Physical Exam: T: 97.1, HR: 57, BP: 146/72, RR:16    General Appearance:  Alert, cooperative, no distress, appears stated age  Head:  Normocephalic, without obvious abnormality, atraumatic  ENT:  Unremarkable  Neck: Supple, symmetrical, trachea midline  Lungs:   Clear to auscultation bilaterally, no w/r/r  Chest Wall:  Rt chest port palpable, no issues  Heart:  Regular rate and rhythm, S1, S2 normal, no murmur, rub or gallop.  Neurologic: Normal affect, no gross deficits.   Labs: Results for orders placed during the hospital encounter of 12/30/12  APTT      Result Value Range   aPTT 30  24 - 37 seconds  CBC      Result Value Range   WBC 6.7  4.0 - 10.5 K/uL   RBC 4.24  3.87 - 5.11 MIL/uL   Hemoglobin 12.2  12.0 - 15.0 g/dL   HCT 40.9  81.1 - 91.4 %   MCV 89.4  78.0 - 100.0 fL   MCH 28.8  26.0 - 34.0 pg   MCHC 32.2  30.0 - 36.0 g/dL   RDW 78.2  95.6 - 21.3 %   Platelets 217  150 - 400 K/uL  PROTIME-INR      Result Value Range   Prothrombin Time 12.5  11.6 - 15.2 seconds   INR 0.95  0.00 - 1.49     Assessment/Plan Colon cancer s/p resection and completion of chemotherapy For port removal. Labs reviewed Coumadin has been held, INR 0.95 Discussed procedure, risks. Consent signed in chart.  Brayton El PA-C 12/30/2012, 8:59 AM

## 2012-12-30 NOTE — Procedures (Signed)
Successful removal of right chest port.  No immediate complication.   

## 2012-12-30 NOTE — Progress Notes (Signed)
Faxed disability form to [phone] and to [facility] @ [phone].

## 2013-01-01 ENCOUNTER — Telehealth: Payer: Self-pay | Admitting: *Deleted

## 2013-01-01 NOTE — Telephone Encounter (Signed)
Pt walked in to office today to request we cancel appt with Dr. Seymour Bars. Pt requests to be referred back to Dr. Arlyce Dice with [facility]. She brought in copy of benign pathology from 2010 to be scanned into her medical record.

## 2013-01-02 ENCOUNTER — Telehealth: Payer: Self-pay | Admitting: *Deleted

## 2013-01-02 DIAGNOSIS — C189 Malignant neoplasm of colon, unspecified: Secondary | ICD-10-CM

## 2013-01-02 NOTE — Telephone Encounter (Signed)
Referral sent to [facility] per pt request. Instructed pt to call that office to schedule appt.

## 2013-01-05 ENCOUNTER — Telehealth: Payer: Self-pay | Admitting: Oncology

## 2013-01-05 ENCOUNTER — Other Ambulatory Visit (HOSPITAL_BASED_OUTPATIENT_CLINIC_OR_DEPARTMENT_OTHER): Payer: BC Managed Care – PPO | Admitting: Lab

## 2013-01-05 DIAGNOSIS — I82409 Acute embolism and thrombosis of unspecified deep veins of unspecified lower extremity: Secondary | ICD-10-CM

## 2013-01-05 DIAGNOSIS — C189 Malignant neoplasm of colon, unspecified: Secondary | ICD-10-CM

## 2013-01-05 NOTE — Telephone Encounter (Signed)
lmonvm for pt re appt w/Dr. Ilda Mori - [facility] [address] [phone]. Lm with d/t/location/phone. No other orders. S/w Regan @ Office.

## 2013-01-06 LAB — CEA: CEA: 0.8 ng/mL (ref 0.0–5.0)

## 2013-01-07 ENCOUNTER — Telehealth: Payer: Self-pay | Admitting: *Deleted

## 2013-01-07 NOTE — Telephone Encounter (Signed)
Message copied by Caleb Popp on Wed Jan 07, 2013 11:36 AM ------      Message from: Thornton Papas B      Created: Tue Jan 06, 2013  7:06 PM       Please call patient, cea is normal ------

## 2013-01-07 NOTE — Telephone Encounter (Signed)
Called pt, CEA normal. She voiced understanding.

## 2013-01-08 ENCOUNTER — Other Ambulatory Visit: Payer: BC Managed Care – PPO | Admitting: Lab

## 2013-01-09 ENCOUNTER — Other Ambulatory Visit: Payer: BC Managed Care – PPO | Admitting: Lab

## 2013-01-09 ENCOUNTER — Ambulatory Visit: Payer: BC Managed Care – PPO

## 2013-01-22 ENCOUNTER — Other Ambulatory Visit: Payer: Self-pay

## 2013-01-22 ENCOUNTER — Encounter (HOSPITAL_COMMUNITY)
Admission: RE | Admit: 2013-01-22 | Discharge: 2013-01-22 | Disposition: A | Discharge status: Home / Self Care | Payer: BC Managed Care – PPO | Source: Ambulatory Visit | Attending: Obstetrics & Gynecology | Admitting: Obstetrics & Gynecology

## 2013-01-22 ENCOUNTER — Encounter (HOSPITAL_COMMUNITY): Payer: Self-pay

## 2013-01-22 DIAGNOSIS — Z01812 Encounter for preprocedural laboratory examination: Secondary | ICD-10-CM | POA: Insufficient documentation

## 2013-01-22 DIAGNOSIS — Z01818 Encounter for other preprocedural examination: Secondary | ICD-10-CM | POA: Insufficient documentation

## 2013-01-22 HISTORY — DX: Personal history of urinary calculi: Z87.442

## 2013-01-22 LAB — CBC
Hemoglobin: 12.4 g/dL (ref 12.0–15.0)
MCH: 29.2 pg (ref 26.0–34.0)
MCHC: 32.8 g/dL (ref 30.0–36.0)
RDW: 13.9 % (ref 11.5–15.5)

## 2013-01-22 NOTE — Pre-Procedure Instructions (Signed)
Pt stopped Coumadin the first part of July. Her PT/PTT results done on 12/30/12 were  WNL.

## 2013-01-22 NOTE — Patient Instructions (Addendum)
Your procedure is scheduled on:02/03/13  Enter through the Main Entrance at :10 am Pick up desk phone and dial [phone] and inform us of your arrival.  Please call [phone] if you have any problems the morning of surgery.  Remember: Do not eat food after midnight:Monday Clear liquids are ok until:0700am Tuesday   You may brush your teeth the morning of surgery.    DO NOT wear jewelry, eye make-up, lipstick,body lotion, or dark fingernail polish.  (Polished toes are ok) You may wear deodorant.  If you are to be admitted after surgery, leave suitcase in car until your room has been assigned. Patients discharged on the day of surgery will not be allowed to drive home. Wear loose fitting, comfortable clothes for your ride home.

## 2013-01-29 ENCOUNTER — Encounter (HOSPITAL_COMMUNITY): Payer: Self-pay | Admitting: Pharmacist

## 2013-02-02 ENCOUNTER — Telehealth: Payer: Self-pay | Admitting: *Deleted

## 2013-02-02 NOTE — Telephone Encounter (Signed)
Dr Christella Hartigan: Pt has history colon cancer; this will be her first recall since colon resection 12/18/11.  Pt is having D and C/hysteroscopy 8/12  for uterine polyps and recall colon 9/2 (3 weeks after D and C).  Is she okay for colonoscopy as scheduled or does she need more time before scheduling colon?  Thanks, Olegario Messier

## 2013-02-03 ENCOUNTER — Ambulatory Visit (HOSPITAL_COMMUNITY): Payer: BC Managed Care – PPO | Admitting: Anesthesiology

## 2013-02-03 ENCOUNTER — Encounter (HOSPITAL_COMMUNITY): Payer: Self-pay | Admitting: General Practice

## 2013-02-03 ENCOUNTER — Ambulatory Visit (HOSPITAL_COMMUNITY)
Admission: RE | Admit: 2013-02-03 | Discharge: 2013-02-03 | Disposition: A | Discharge status: Home / Self Care | Payer: BC Managed Care – PPO | Source: Ambulatory Visit | Attending: Obstetrics & Gynecology | Admitting: Obstetrics & Gynecology

## 2013-02-03 ENCOUNTER — Encounter (HOSPITAL_COMMUNITY)
Admission: RE | Disposition: A | Discharge status: Home / Self Care | Payer: Self-pay | Source: Ambulatory Visit | Attending: Obstetrics & Gynecology

## 2013-02-03 ENCOUNTER — Encounter (HOSPITAL_COMMUNITY): Payer: Self-pay | Admitting: Anesthesiology

## 2013-02-03 DIAGNOSIS — Z880 Allergy status to penicillin: Secondary | ICD-10-CM | POA: Insufficient documentation

## 2013-02-03 DIAGNOSIS — Z85038 Personal history of other malignant neoplasm of large intestine: Secondary | ICD-10-CM | POA: Insufficient documentation

## 2013-02-03 DIAGNOSIS — N840 Polyp of corpus uteri: Secondary | ICD-10-CM | POA: Insufficient documentation

## 2013-02-03 DIAGNOSIS — Z886 Allergy status to analgesic agent status: Secondary | ICD-10-CM | POA: Insufficient documentation

## 2013-02-03 DIAGNOSIS — Z7901 Long term (current) use of anticoagulants: Secondary | ICD-10-CM | POA: Insufficient documentation

## 2013-02-03 DIAGNOSIS — Z79899 Other long term (current) drug therapy: Secondary | ICD-10-CM | POA: Insufficient documentation

## 2013-02-03 DIAGNOSIS — Z86718 Personal history of other venous thrombosis and embolism: Secondary | ICD-10-CM | POA: Insufficient documentation

## 2013-02-03 DIAGNOSIS — Z87891 Personal history of nicotine dependence: Secondary | ICD-10-CM | POA: Insufficient documentation

## 2013-02-03 DIAGNOSIS — N950 Postmenopausal bleeding: Secondary | ICD-10-CM | POA: Insufficient documentation

## 2013-02-03 HISTORY — PX: DILATATION & CURRETTAGE/HYSTEROSCOPY WITH RESECTOCOPE: SHX5572

## 2013-02-03 SURGERY — DILATATION & CURETTAGE/HYSTEROSCOPY WITH RESECTOCOPE
Anesthesia: General | Site: Vagina | Wound class: Clean Contaminated

## 2013-02-03 MED ORDER — FENTANYL CITRATE 0.05 MG/ML IJ SOLN
INTRAMUSCULAR | Status: DC | PRN
  Administered 2013-02-03: 100 ug via INTRAVENOUS

## 2013-02-03 MED ORDER — MIDAZOLAM HCL 2 MG/2ML IJ SOLN
INTRAMUSCULAR | Status: AC
  Filled 2013-02-03: qty 2

## 2013-02-03 MED ORDER — LIDOCAINE HCL 2 % IJ SOLN
INTRAMUSCULAR | Status: DC | PRN
  Administered 2013-02-03: 10 mL

## 2013-02-03 MED ORDER — GLYCOPYRROLATE 0.2 MG/ML IJ SOLN
INTRAMUSCULAR | Status: DC | PRN
  Administered 2013-02-03: 0.1 mg via INTRAVENOUS

## 2013-02-03 MED ORDER — LIDOCAINE HCL 2 % IJ SOLN
INTRAMUSCULAR | Status: AC
  Filled 2013-02-03: qty 20

## 2013-02-03 MED ORDER — GLYCOPYRROLATE 0.2 MG/ML IJ SOLN
INTRAMUSCULAR | Status: AC
  Filled 2013-02-03: qty 1

## 2013-02-03 MED ORDER — MIDAZOLAM HCL 5 MG/5ML IJ SOLN
INTRAMUSCULAR | Status: DC | PRN
  Administered 2013-02-03: 2 mg via INTRAVENOUS

## 2013-02-03 MED ORDER — LACTATED RINGERS IV SOLN
INTRAVENOUS | Status: DC
  Administered 2013-02-03 (×2): via INTRAVENOUS

## 2013-02-03 MED ORDER — PROPOFOL 10 MG/ML IV BOLUS
INTRAVENOUS | Status: DC | PRN
  Administered 2013-02-03: 150 mg via INTRAVENOUS

## 2013-02-03 MED ORDER — MIDAZOLAM HCL 2 MG/2ML IJ SOLN: 0.5000 mg | Freq: Once | INTRAMUSCULAR | Status: DC | PRN

## 2013-02-03 MED ORDER — FENTANYL CITRATE 0.05 MG/ML IJ SOLN
INTRAMUSCULAR | Status: AC
  Filled 2013-02-03: qty 2

## 2013-02-03 MED ORDER — LIDOCAINE HCL (CARDIAC) 20 MG/ML IV SOLN
INTRAVENOUS | Status: DC | PRN
  Administered 2013-02-03: 100 mg via INTRAVENOUS

## 2013-02-03 MED ORDER — VASOPRESSIN 20 UNIT/ML IJ SOLN
INTRAMUSCULAR | Status: AC
  Filled 2013-02-03: qty 1

## 2013-02-03 MED ORDER — FENTANYL CITRATE 0.05 MG/ML IJ SOLN: 25.0000 ug | INTRAMUSCULAR | Status: DC | PRN

## 2013-02-03 MED ORDER — PROMETHAZINE HCL 25 MG/ML IJ SOLN: 6.2500 mg | INTRAMUSCULAR | Status: DC | PRN

## 2013-02-03 MED ORDER — ONDANSETRON HCL 4 MG/2ML IJ SOLN
INTRAMUSCULAR | Status: DC | PRN
  Administered 2013-02-03: 4 mg via INTRAVENOUS

## 2013-02-03 MED ORDER — GLYCINE 1.5 % IR SOLN
Status: DC | PRN
  Administered 2013-02-03: 3000 mL

## 2013-02-03 MED ORDER — MEPERIDINE HCL 25 MG/ML IJ SOLN: 6.2500 mg | INTRAMUSCULAR | Status: DC | PRN

## 2013-02-03 SURGICAL SUPPLY — 14 items
CANISTER SUCTION 2500CC (MISCELLANEOUS) ×1 IMPLANT
CATH ROBINSON RED A/P 16FR (CATHETERS) ×2 IMPLANT
CLOTH BEACON ORANGE TIMEOUT ST (SAFETY) ×2 IMPLANT
CONTAINER PREFILL 10% NBF 60ML (FORM) ×4 IMPLANT
DRESSING TELFA 8X3 (GAUZE/BANDAGES/DRESSINGS) ×2 IMPLANT
ELECT REM PT RETURN 9FT ADLT (ELECTROSURGICAL) ×2
ELECTRODE REM PT RTRN 9FT ADLT (ELECTROSURGICAL) IMPLANT
GLOVE BIO SURGEON STRL SZ 6.5 (GLOVE) ×2 IMPLANT
GOWN STRL REIN XL XLG (GOWN DISPOSABLE) ×4 IMPLANT
LOOP ANGLED CUTTING 22FR (CUTTING LOOP) IMPLANT
PACK HYSTEROSCOPY LF (CUSTOM PROCEDURE TRAY) ×2 IMPLANT
PAD OB MATERNITY 4.3X12.25 (PERSONAL CARE ITEMS) ×2 IMPLANT
TOWEL OR 17X24 6PK STRL BLUE (TOWEL DISPOSABLE) ×4 IMPLANT
WATER STERILE IRR 1000ML POUR (IV SOLUTION) ×2 IMPLANT

## 2013-02-03 NOTE — Anesthesia Procedure Notes (Signed)
Procedure Name: LMA Insertion Date/Time: 02/03/2013 11:58 AM Performed by: Rai Severns, Jannet Askew Pre-anesthesia Checklist: Patient identified, Emergency Drugs available, Suction available, Patient being monitored and Timeout performed Patient Re-evaluated:Patient Re-evaluated prior to inductionOxygen Delivery Method: Circle system utilized Preoxygenation: Pre-oxygenation with 100% oxygen Intubation Type: IV induction Ventilation: Mask ventilation without difficulty LMA Size: 4.0 Number of attempts: 1 Placement Confirmation: breath sounds checked- equal and bilateral Dental Injury: Teeth and Oropharynx as per pre-operative assessment

## 2013-02-03 NOTE — Anesthesia Preprocedure Evaluation (Addendum)
Anesthesia Evaluation  Patient identified by MRN, date of birth, ID band Patient awake    Reviewed: Allergy & Precautions, H&P , Patient's Chart, lab work & pertinent test results, reviewed documented beta blocker date and time   History of Anesthesia Complications Negative for: history of anesthetic complications  Airway Mallampati: II TM Distance: >3 FB Neck ROM: full    Dental no notable dental hx. (+)    Pulmonary neg pulmonary ROS,  breath sounds clear to auscultation  Pulmonary exam normal       Cardiovascular Exercise Tolerance: Good negative cardio ROS  Rhythm:regular Rate:Normal     Neuro/Psych negative neurological ROS  negative psych ROS   GI/Hepatic negative GI ROS, Neg liver ROS,   Endo/Other  negative endocrine ROS  Renal/GU negative Renal ROS     Musculoskeletal   Abdominal   Peds  Hematology negative hematology ROS (+)   Anesthesia Other Findings Abdominal pain     Cancer 11/2011 cancer in colon    DVT (deep venous thrombosis) 05/2012 (R)IJ and subclavian History of kidney stones      Reproductive/Obstetrics negative OB ROS                          Anesthesia Physical Anesthesia Plan  ASA: III  Anesthesia Plan: General LMA   Post-op Pain Management:    Induction:   Airway Management Planned:   Additional Equipment:   Intra-op Plan:   Post-operative Plan:   Informed Consent: I have reviewed the patients History and Physical, chart, labs and discussed the procedure including the risks, benefits and alternatives for the proposed anesthesia with the patient or authorized representative who has indicated his/her understanding and acceptance.   Dental Advisory Given  Plan Discussed with: CRNA, Surgeon and Anesthesiologist  Anesthesia Plan Comments:        Anesthesia Quick Evaluation

## 2013-02-03 NOTE — H&P (Signed)
[name] is an 67 y.o. female. She presents for abnormal vaginal bleeding.  She has a known endometrial polyp diagnosed in 2010.  Pertinent Gynecological History: Menses: post-menopausal Bleeding: post menopausal bleeding   Past Medical History  Diagnosis Date  . Abdominal pain   . Cancer 11/2011    cancer in colon  . DVT (deep venous thrombosis) 05/2012    (R)IJ and subclavian  . History of kidney stones     Past Surgical History  Procedure Laterality Date  . Cholecystectomy    . Goiter removal     . Kidney stone surgery    . Tonsillectomy      as child  . Colon resection  12/18/2011    Procedure: COLON RESECTION LAPAROSCOPIC;  Surgeon: Clovis Pu. Cornett, MD;  Location: [facility] OR;  Service: General;  Laterality: N/A;  . Portacath placement  6/13  . Portacath removal  12/30/12    Family History  Problem Relation Age of Onset  . Breast cancer Mother   . Heart disease Mother   . Hypertension Other     Social History:  reports that she quit smoking about 2 months ago. Her smoking use included Cigarettes. She has a 8.75 pack-year smoking history. She has never used smokeless tobacco. She reports that she does not drink alcohol or use illicit drugs.  Allergies:  Allergies  Allergen Reactions  . Aspirin Nausea And Vomiting  . Advil (Ibuprofen) Palpitations  . Penicillins Rash    Prescriptions prior to admission  Medication Sig Dispense Refill  . acetaminophen (TYLENOL) 500 MG tablet Take 1,000 mg by mouth every 6 (six) hours as needed for pain.      Marland Kitchen warfarin (COUMADIN) 5 MG tablet Take 5 mg by mouth daily.         Review of Systems  Constitutional: Negative.   HENT: Negative.   Respiratory: Negative.   Cardiovascular: Negative.   All other systems reviewed and are negative.    Blood pressure 127/81, pulse 67, temperature 98.4 F (36.9 C), resp. rate 16, SpO2 100.00%. Physical Exam  Constitutional: She appears well-developed.  HENT:  Head: Normocephalic.   Eyes: Pupils are equal, round, and reactive to light.  Cardiovascular: Normal rate.   Respiratory: Effort normal.  GI: Soft.  Genitourinary: Vagina normal and uterus normal.  Neurological: She is alert.  Skin: Skin is warm.    No results found for this or any previous visit (from the past 24 hour(s)).  No results found.  Assessment/Plan:  Hysteroscopy and polypectomy for abnormal uterine bleeding and suspected endometrial polyp.  Paiton Boultinghouse D 02/03/2013, 10:26 AM

## 2013-02-03 NOTE — Anesthesia Postprocedure Evaluation (Signed)
  Anesthesia Post-op Note  Anesthesia Post Note  Patient: [name]  Procedure(s) Performed: Procedure(s) (LRB): DILATATION & CURETTAGE/HYSTEROSCOPY, removal  of endometrial mass (N/A)  Anesthesia type: General  Patient location: PACU  Post pain: Pain level controlled  Post assessment: Post-op Vital signs reviewed  Last Vitals:  Filed Vitals:   02/03/13 1315  BP: 147/66  Pulse: 58  Temp:   Resp: 15    Post vital signs: Reviewed  Level of consciousness: sedated  Complications: No apparent anesthesia complications

## 2013-02-03 NOTE — Op Note (Signed)
Patient Name: [name] MRN: [id number]  Date of Surgery: 02/03/2013    PREOPERATIVE DIAGNOSIS: ENDOMETRIAL POLYP / ENDOMETRIAL THICKENING  POSTOPERATIVE DIAGNOSIS: ENDOMETRIAL POLYP / ENDOMETRIAL THICKENING   PROCEDURE: Hysteroscopy and Polypectomy  SURGEON: Caralyn Guile. Arlyce Dice M.D.  ANESTHESIA: General  ESTIMATED BLOOD LOSS: Minimal  FINDINGS: Large endometrial polypoid mass.   INDICATIONS: Recurrent episode of post menopausal bleeding.  Office [place] in 2010 showed polyp on path report.  PROCEDURE IN DETAIL: The patient was taken to the OR and placed in the dors-lithotomy position. The perineum and vagina were prepped and draped in a sterile fashion. Bimanual exam revealed a retroverted normal sized uterus. 10 ml of 2% lidocaine was infiltrated in the paracervical tissue and Pratt dilators were used to open the cervix to 21 [place]. The hysteroscope was introduced and the polyp was identified.  Polyp forceps were used to remove the polyp.  Several passes were necessary.  The hysteroscope was reintroduced to insure that the polypoid tissue was adequately removed.  There appeared to be some irregularities in the endometrial cavity consistent with small intramural myoma impinging on the endometrial canal.  There was no tissue suspicious for carcinoma seen. The procedure was then terminated and the patient left the operating room in good condition.

## 2013-02-03 NOTE — Progress Notes (Signed)
I have interviewed and performed the pertinent exams on my patient to confirm that there have been no significant changes in her condition since the dictation of her history and physical exam.  

## 2013-02-03 NOTE — Discharge Instructions (Signed)
° °  D & C Home care Instructions: ° ° °Personal hygiene:  Used sanitary napkins for vaginal drainage not tampons. Shower or tub bathe the day after your procedure. No douching until bleeding stops. Always wipe from front to back after  Elimination. ° °Activity: Do not drive or operate any equipment today. The effects of the anesthesia are still present and drowsiness may result. Rest today, not necessarily flat bed rest, just take it easy. You may resume your normal activity in one to 2 days. ° °Sexual activity: No intercourse for one week or as indicated by your physician ° °Diet: Eat a light diet as desired this evening. You may resume a regular diet tomorrow. ° °Return to work: One to 2 days. ° °General Expectations of your surgery: Vaginal bleeding should be no heavier than a normal period. Spotting may continue up to 10 days. Mild cramps may continue for a couple of days. You may have a regular period in 2-6 weeks. ° °Unexpected observations call your doctor if these occur: persistent or heavy bleeding. Severe abdominal cramping or pain. Elevation of temperature greater than 100°F. ° °Call for an appointment in one week. ° ° ° °Patient's Signature_______________________________________________________ ° °Nurse's Signature________________________________________________________ ° ° °Post Anesthesia Home Care Instructions ° °Activity: °Get plenty of rest for the remainder of the day. A responsible adult should stay with you for 24 hours following the procedure.  °For the next 24 hours, DO NOT: °-Drive a car °-Operate machinery °-Drink alcoholic beverages °-Take any medication unless instructed by your physician °-Make any legal decisions or sign important papers. ° °Meals: °Start with liquid foods such as gelatin or soup. Progress to regular foods as tolerated. Avoid greasy, spicy, heavy foods. If nausea and/or vomiting occur, drink only clear liquids until the nausea and/or vomiting subsides. Call your physician  if vomiting continues. ° °Special Instructions/Symptoms: °Your throat may feel dry or sore from the anesthesia or the breathing tube placed in your throat during surgery. If this causes discomfort, gargle with warm salt water. The discomfort should disappear within 24 hours. ° °

## 2013-02-03 NOTE — Transfer of Care (Signed)
Immediate Anesthesia Transfer of Care Note  Patient: [name]  Procedure(s) Performed: Procedure(s): DILATATION & CURETTAGE/HYSTEROSCOPY, removal  of endometrial mass (N/A)  Patient Location: PACU  Anesthesia Type:General  Level of Consciousness: awake and alert   Airway & Oxygen Therapy: Patient Spontanous Breathing and Patient connected to nasal cannula oxygen  Post-op Assessment: Report given to PACU RN and Post -op Vital signs reviewed and stable  Post vital signs: Reviewed and stable  Complications: No apparent anesthesia complications

## 2013-02-04 ENCOUNTER — Encounter (HOSPITAL_COMMUNITY): Payer: Self-pay | Admitting: Obstetrics & Gynecology

## 2013-02-08 NOTE — Telephone Encounter (Signed)
She should be safe from a medical standpoint but if she wants to push it back a few more weeks to allow more recovery time from her gyne surgery that is OK with me.

## 2013-02-09 NOTE — Telephone Encounter (Signed)
Talked with pt.  She is doing well from GYN surgery and wants to keep colonoscopy appt for 9/2.  Previsit will be Friday 8/22

## 2013-02-13 ENCOUNTER — Ambulatory Visit (AMBULATORY_SURGERY_CENTER): Payer: BC Managed Care – PPO | Admitting: *Deleted

## 2013-02-13 ENCOUNTER — Encounter: Payer: Self-pay | Admitting: Gastroenterology

## 2013-02-13 VITALS — Ht 65.0 in | Wt 164.0 lb

## 2013-02-13 DIAGNOSIS — Z85038 Personal history of other malignant neoplasm of large intestine: Secondary | ICD-10-CM

## 2013-02-13 MED ORDER — PEG-KCL-NACL-NASULF-NA ASC-C 100 G PO SOLR: ORAL | Status: DC

## 2013-02-13 NOTE — Progress Notes (Signed)
Pt states she recently had a bad experience with IVs.  She states she still has pain in both hands after her last IV attempts on 02-03-13.  She wants staff to be aware of this

## 2013-02-17 ENCOUNTER — Other Ambulatory Visit: Payer: BC Managed Care – PPO | Admitting: Gastroenterology

## 2013-02-24 ENCOUNTER — Ambulatory Visit (AMBULATORY_SURGERY_CENTER): Payer: BC Managed Care – PPO | Admitting: Gastroenterology

## 2013-02-24 ENCOUNTER — Encounter: Payer: Self-pay | Admitting: Gastroenterology

## 2013-02-24 VITALS — BP 114/56 | HR 49 | Temp 97.3°F | Resp 12 | Ht 65.0 in | Wt 164.0 lb

## 2013-02-24 DIAGNOSIS — D126 Benign neoplasm of colon, unspecified: Secondary | ICD-10-CM

## 2013-02-24 DIAGNOSIS — Z86010 Personal history of colonic polyps: Secondary | ICD-10-CM

## 2013-02-24 DIAGNOSIS — Z85038 Personal history of other malignant neoplasm of large intestine: Secondary | ICD-10-CM

## 2013-02-24 MED ORDER — SODIUM CHLORIDE 0.9 % IV SOLN: 500.0000 mL | INTRAVENOUS | Status: DC

## 2013-02-24 NOTE — Progress Notes (Signed)
Pt tolerated procedure well. anastamosis identified during procedure from previous surgery. emw

## 2013-02-24 NOTE — Op Note (Signed)
[facility] [address] [place], [address]   COLONOSCOPY PROCEDURE REPORT  PATIENT: [name], [name]  MR#: [id number] BIRTHDATE: [birth year] , 66  yrs. old GENDER: Female ENDOSCOPIST: Rachael Fee, MD PROCEDURE DATE:  02/24/2013 PROCEDURE:   Colonoscopy with snare polypectomy First Screening Colonoscopy - Avg.  risk and is 50 yrs.  old or older - No.  Prior Negative Screening - Now for repeat screening. N/A  History of Adenoma - Now for follow-up colonoscopy & has been > or = to 3 yrs.  N/A  Polyps Removed Today? Yes. 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  Adjuvant FOLFOX chemotherapy was initiated on 01/21/2012.Marland Kitchen MEDICATIONS: Fentanyl 50 mcg IV, Versed 5 mg IV, and These medications were titrated to patient response per physician's verbal order  DESCRIPTION OF PROCEDURE:   After the risks benefits and alternatives of the procedure were thoroughly explained, informed consent was obtained.  A digital rectal exam revealed no abnormalities of the rectum.   The LB [id number]-[id number]  endoscope was introduced through the anus and advanced to the cecum, which was identified by both the appendix and ileocecal valve. No adverse events experienced.   The quality of the prep was good, using MoviPrep  The instrument was then slowly withdrawn as the colon was fully examined.  COLON FINDINGS: The right sided colo-colonic anastomosis was well visualized, appeared normal.  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  There was a single small sessile polyp in descending segment, measured 3mm across, removed with cold snare and sent to pathlogy.  The examination was otherwise normal.  Retroflexed views revealed no abnormalities. The time to cecum=2 minutes 35 seconds.  Withdrawal time=9 minutes 24 seconds.  The scope  was withdrawn and the procedure completed. COMPLICATIONS: There were no complications.  ENDOSCOPIC IMPRESSION: The right sided colo-colonic anastomosis was well visualized, appeared normal.  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  There was a single small sessile polyp in descending segment, measured 3mm across, removed with cold snare and sent to pathlogy.  The examination was otherwise normal.  RECOMMENDATIONS: Given your personal history of colon cancer and also adenomatous (pre-cancerous) polyps, you will need a repeat colonoscopy in 3 years even if the polyp removed today is not pre-cancerous. eSigned:  Rachael Fee, MD 02/24/2013 11:19 AM   cc:  Mancel Bale, MD; Romie Levee, MD

## 2013-02-24 NOTE — Progress Notes (Signed)
Patient did not experience any of the following events: a burn prior to discharge; a fall within the facility; wrong site/side/patient/procedure/implant event; or a hospital transfer or hospital admission upon discharge from the facility. (G8907) Patient did not have preoperative order for IV antibiotic SSI prophylaxis. (G8918)  

## 2013-02-24 NOTE — Patient Instructions (Addendum)
Discharge instructions given with verbal understanding. Handout on polyp given. Resume previous medications. YOU HAD AN ENDOSCOPIC PROCEDURE TODAY AT THE [facility]: Refer to the procedure report that was given to you for any specific questions about what was found during the examination.  If the procedure report does not answer your questions, please call your gastroenterologist to clarify.  If you requested that your care partner not be given the details of your procedure findings, then the procedure report has been included in a sealed envelope for you to review at your convenience later.  YOU SHOULD EXPECT: Some feelings of bloating in the abdomen. Passage of more gas than usual.  Walking can help get rid of the air that was put into your GI tract during the procedure and reduce the bloating. If you had a lower endoscopy (such as a colonoscopy or flexible sigmoidoscopy) you may notice spotting of blood in your stool or on the toilet paper. If you underwent a bowel prep for your procedure, then you may not have a normal bowel movement for a few days.  DIET: Your first meal following the procedure should be a light meal and then it is ok to progress to your normal diet.  A half-sandwich or bowl of soup is an example of a good first meal.  Heavy or fried foods are harder to digest and may make you feel nauseous or bloated.  Likewise meals heavy in dairy and vegetables can cause extra gas to form and this can also increase the bloating.  Drink plenty of fluids but you should avoid alcoholic beverages for 24 hours.  ACTIVITY: Your care partner should take you home directly after the procedure.  You should plan to take it easy, moving slowly for the rest of the day.  You can resume normal activity the day after the procedure however you should NOT DRIVE or use heavy machinery for 24 hours (because of the sedation medicines used during the test).    SYMPTOMS TO REPORT IMMEDIATELY: A  gastroenterologist can be reached at any hour.  During normal business hours, 8:30 AM to 5:00 PM Monday through Friday, call [phone].  After hours and on weekends, please call the GI answering service at [phone] who will take a message and have the physician on call contact you.   Following lower endoscopy (colonoscopy or flexible sigmoidoscopy):  Excessive amounts of blood in the stool  Significant tenderness or worsening of abdominal pains  Swelling of the abdomen that is new, acute  Fever of 100F or higher   FOLLOW UP: If any biopsies were taken you will be contacted by phone or by letter within the next 1-3 weeks.  Call your gastroenterologist if you have not heard about the biopsies in 3 weeks.  Our staff will call the home number listed on your records the next business day following your procedure to check on you and address any questions or concerns that you may have at that time regarding the information given to you following your procedure. This is a courtesy call and so if there is no answer at the home number and we have not heard from you through the emergency physician on call, we will assume that you have returned to your regular daily activities without incident.  SIGNATURES/CONFIDENTIALITY: You and/or your care partner have signed paperwork which will be entered into your electronic medical record.  These signatures attest to the fact that that the information above on your After Visit Summary   has been reviewed and is understood.  Full responsibility of the confidentiality of this discharge information lies with you and/or your care-partner.  

## 2013-02-25 ENCOUNTER — Telehealth: Payer: Self-pay | Admitting: *Deleted

## 2013-02-25 NOTE — Telephone Encounter (Signed)
  Follow up Call-  Call back number 02/24/2013 10/16/2011  Post procedure Call Back phone  # [phone]  Permission to leave phone message Yes Yes    Called [phone]- man answered and it is a wrong number, so called [phone] Patient questions:  Do you have a fever, pain , or abdominal swelling? no Pain Score  0 *  Have you tolerated food without any problems? yes  Have you been able to return to your normal activities? yes  Do you have any questions about your discharge instructions: Diet   no Medications  no Follow up visit  no  Do you have questions or concerns about your Care? no  Actions: * If pain score is 4 or above: No action needed, pain <4.

## 2013-03-10 ENCOUNTER — Encounter: Payer: Self-pay | Admitting: Gastroenterology

## 2013-03-20 ENCOUNTER — Other Ambulatory Visit: Payer: BC Managed Care – PPO | Admitting: Oncology

## 2013-03-20 ENCOUNTER — Other Ambulatory Visit: Payer: BC Managed Care – PPO | Admitting: Lab

## 2013-03-20 ENCOUNTER — Ambulatory Visit (HOSPITAL_BASED_OUTPATIENT_CLINIC_OR_DEPARTMENT_OTHER): Payer: BC Managed Care – PPO | Admitting: Oncology

## 2013-03-20 ENCOUNTER — Telehealth: Payer: Self-pay | Admitting: Oncology

## 2013-03-20 VITALS — BP 145/83 | HR 70 | Temp 97.7°F | Resp 18 | Ht 65.0 in | Wt 165.1 lb

## 2013-03-20 DIAGNOSIS — C189 Malignant neoplasm of colon, unspecified: Secondary | ICD-10-CM

## 2013-03-20 DIAGNOSIS — C184 Malignant neoplasm of transverse colon: Secondary | ICD-10-CM

## 2013-03-20 DIAGNOSIS — G620 Drug-induced polyneuropathy: Secondary | ICD-10-CM

## 2013-03-20 DIAGNOSIS — Z23 Encounter for immunization: Secondary | ICD-10-CM

## 2013-03-20 MED ORDER — INFLUENZA VAC SPLIT QUAD 0.5 ML IM SUSP
0.5000 mL | Freq: Once | INTRAMUSCULAR | Status: AC
  Administered 2013-03-20: 0.5 mL via INTRAMUSCULAR
  Filled 2013-03-20: qty 0.5

## 2013-03-20 NOTE — Progress Notes (Signed)
   [facility]    OFFICE PROGRESS NOTE   INTERVAL HISTORY:   She returns as scheduled. He complains of a "cold "feeling in the lower legs and feet. Occasional numbness and tingling in the feet and fingers. Good appetite. No difficulty with bowel function. She underwent a colonoscopy on 02/24/2013. A single polyp was removed from the left colon and returned as a hyperplastic polyp.  She underwent removal of an endometrial polyp on 02/03/2013. The pathology was benign.  Objective:  Vital signs in last 24 hours:  Blood pressure 145/83, pulse 70, temperature 97.7 F (36.5 C), temperature source Oral, resp. rate 18, height 5\' 5"  (1.651 m), weight 165 lb 1.6 oz (74.889 kg).    HEENT: Neck without mass Lymphatics: No cervical, supraclavicular, axillary, or inguinal nodes Resp: Bronchial sounds at the upper chest bilaterally, no respiratory distress Cardio: Regular rate and rhythm GI: No hepatosplenomegaly, nontender Vascular: No leg edema  Skin: Keloid formation at the right upper chest Port-A-Cath scar    Lab Results:  CEA on 01/05/2013-0.8   Medications: I have reviewed the patient's current medications.  Assessment/Plan: 1. 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 Adjuvant FOLFOX chemotherapy was initiated on 01/21/2012. The last cycle was completed on 06/30/2012. Restaging CT scans 09/30/2012 showed no evidence of recurrent/metastatic disease. -Surveillance colonoscopy 02/24/2013 with a single hyperplastic polyp 2. Delayed nausea following cycle 2 FOLFOX. Aloxi was added with cycle 3. Persistent delayed nausea. Emend was added with cycle 4. She noted improvement in the nausea following cycle 4. She declined steroid prophylaxis.  3. Tiny lung nodules seen on a CT of the chest 10/19/2011. Stable on CT of the chest 09/30/2012. 4. History of tobacco use 5. Endometrial thickening on CT  of the abdomen 10/02/2011. Progressive endometrial thickening on CT 09/30/2012. Status post removal of an endometrial polyp A. 12 2014 with benign pathology 6. Port-A-Cath placement 01/09/2012 in interventional radiology. Removed 12/30/2012. 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  8. Mild thrombocytopenia secondary to chemotherapy-resolved. 9. History of neutropenia secondary to chemotherapy. 10. Hypokalemia on labs 04/28/2012. She is taking a potassium supplement. Potassium was in the low normal range on 05/26/2012 and 06/09/2012. Normal on 06/30/2012. 11. 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  12. Anemia secondary to chemotherapy-improved on 07/29/2012.   Disposition:  Ms. Stringfield remains in clinical remission from colon cancer. She will return for an office visit and CEA in 3 months. She declined a trial of gabapentin for the neuropathy symptoms.  She received an influenza vaccine today. I encouraged her to obtain a primary care physician.   Thornton Papas, MD  03/20/2013  8:43 AM

## 2013-03-20 NOTE — Telephone Encounter (Signed)
Gave pt appt for lab and ML on December 2014  °

## 2013-03-24 ENCOUNTER — Encounter (INDEPENDENT_AMBULATORY_CARE_PROVIDER_SITE_OTHER): Payer: Self-pay

## 2013-05-11 ENCOUNTER — Ambulatory Visit (INDEPENDENT_AMBULATORY_CARE_PROVIDER_SITE_OTHER): Payer: BC Managed Care – PPO | Admitting: Surgery

## 2013-06-02 ENCOUNTER — Ambulatory Visit (INDEPENDENT_AMBULATORY_CARE_PROVIDER_SITE_OTHER): Payer: Self-pay | Admitting: Surgery

## 2013-06-08 ENCOUNTER — Ambulatory Visit (INDEPENDENT_AMBULATORY_CARE_PROVIDER_SITE_OTHER): Payer: Medicare Other | Admitting: Surgery

## 2013-06-08 ENCOUNTER — Encounter (INDEPENDENT_AMBULATORY_CARE_PROVIDER_SITE_OTHER): Payer: Self-pay | Admitting: Surgery

## 2013-06-08 VITALS — BP 130/80 | HR 72 | Temp 98.6°F | Resp 14 | Ht 65.0 in | Wt 170.6 lb

## 2013-06-08 DIAGNOSIS — Z85038 Personal history of other malignant neoplasm of large intestine: Secondary | ICD-10-CM

## 2013-06-08 NOTE — Patient Instructions (Signed)
MEDERMA cream for the scar or hydrocortisone cream. Return 1 year.

## 2013-06-08 NOTE — Progress Notes (Signed)
Subjective:     Patient ID: [name], female   DOB: 1946-06-20, 67 y.o.   MRN: [id number] Patient returns for followup of her stage III colon cancer resected in June of 2013. 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 She continues on Coumadin for her DVT. She would like to have her Port-A-Cath removed. She finished therapy in January. She is completing her Coumadin for DVT. She is in good spirits. Denies abdominal pain. Tolerating her diet. No evidence of recurrent cancer she is one year out. colonoscopy showed hyperplastic polyp.  She had an endometrial polyp removed.    Review of Systems  Constitutional: Negative.   HENT: Negative.   Cardiovascular: Negative.   Gastrointestinal: Negative.    Patient returns for followup of her stage III colon cancer resected in June of 2013. 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 She continues on Coumadin for her DVT.    Objective:   Physical Exam  Constitutional: She appears well-developed and well-nourished.  HENT:  Head: Normocephalic and atraumatic.  Eyes: EOM are normal.  Neck: Normal range of motion. Neck supple.  Abdominal: She exhibits no ascites and no mass. There is no tenderness. There is no rebound. No hernia.    Clinical Data: History of colon cancer. Restaging scan.  CT CHEST, ABDOMEN AND PELVIS WITH CONTRAST  Technique: Multidetector CT imaging of the chest, abdomen and  pelvis was performed following the standard protocol during bolus  administration of intravenous contrast.  Contrast: OMNIPAQUE IOHEXOL 300 MG/ML SOLN  Comparison: CT of the chest 10/19/2011. CT of the abdomen and  pelvis 10/02/2011.  CT CHEST  Findings:  Mediastinum: Enlarged and heterogeneous appearing left lobe of  thyroid gland, similar to prior examinations, favored to represent  a goiter. The right lobe of the gland and isthmus are not  visualized, and may be  surgically resected. No pathologically  enlarged mediastinal or hilar lymph nodes. Heart size is normal.  There is no significant pericardial fluid, thickening or  pericardial calcification. A small hiatal hernia. Right internal  jugular single lumen Port-A-Cath with tip terminating in the right  atrium.  Lungs/Pleura: Multiple small pulmonary nodules appear similar in  size, number and distribution. Specifically, there is a 4 mm  nodule in the apex of the right upper lobe (image 10 of series 5),  a 4 mm nodule the periphery of the left upper lobe (image 22 of  series 5), and a 5 mm nodule in the posterior aspect of the right  lower lobe (image 33 of series 5). No larger more suspicious  appearing pulmonary nodules or masses are otherwise noted. No  acute consolidative air space disease. No pleural effusions.  Musculoskeletal: There are no aggressive appearing lytic or blastic  lesions noted in the visualized portions of the skeleton.  IMPRESSION:  1. No definite findings to suggest metastatic disease to the  thorax on today's examination.  2. Multiple previously noted small pulmonary nodules appear  unchanged in size, number and distribution compared to the prior  examination, as above. Given their stability in size over the  prior 1-year time interval, these are favored to be benign, but  continued attention on future follow up studies until full 2 years  of stability has been documented is recommended.  3. Additional incidental findings, as above.  CT ABDOMEN AND PELVIS  Findings:  Abdomen/Pelvis: A 1.8 cm lesion with peripheral nodular enhancement  in segment 8  of the liver is unchanged, most compatible with a  cavernous hemangioma. 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 Multiple other small low  attenuation hepatic lesions are generally too small to  characterize, with  exception of a 1.5 cm low attenuation lesion in  segment two of the liver which is compatible with a simple cyst.  No other lesions are favored to be a small cyst or biliary  hamartomas as they are unchanged compared to the prior examination.  No definite new enlarging suspicious appearing hepatic lesion is  otherwise noted to suggest the presence of metastatic disease to  the liver at this time.  Status post cholecystectomy. Large duodenal diverticulum from the  second portion of the duodenum. The appearance of the pancreas is  unremarkable. A 9 mm low attenuation lesion in the spleen is  unchanged and presumably benign. The appearance of the adrenal  glands and the left kidney is unremarkable. Small parenchymal  defect in the posterior aspect of the upper pole of the right  kidney is most compatible with cortical scarring. There are also  several subcentimeter low attenuation lesions in the right kidney  which are too small to definitively characterize, but are favored  to represent small cysts as they are unchanged compared to the  prior examination. Normal appendix. Numerous colonic diverticula,  without surrounding inflammatory changes to suggest acute  diverticulitis at this time. Postoperative changes of partial  colectomy are noted in the region of the splenic flexure of the  colon. No definite soft tissue mass is identified adjacent to the  suture line to suggest local recurrence of disease. No significant  volume of ascites. No pneumoperitoneum. No pathologic distension  of small bowel. No definite pathologic lymphadenopathy identified  within the abdomen or pelvis. Thickening of the endometrial canal  which appears to measure up to 2.3 cm in the uterine fundus, highly  unusual in a post menopausal female, and increased compared to the  prior study. Urinary bladder is unremarkable in appearance.  Musculoskeletal: There are no aggressive appearing lytic or blastic  lesions  noted in the visualized portions of the skeleton.  IMPRESSION:  1. Status post partial colectomy in the region of the splenic  flexure, without evidence to suggest local recurrence of disease or  an new metastatic disease in the abdomen or pelvis.  2. However, there is worsening thickening of the endometrium in  the fundal portion of the uterus which is abnormal in a post  menopausal female. Correlation with transvaginal ultrasound is  highly recommended to exclude the presence of a developing  endometrial malignancy.  3. Numerous liver lesions appear unchanged compared to the prior  examination and appear to represent a combination of small cysts  and/or hemangiomas.  4. Small low attenuation lesions in the right kidney are also  unchanged, and although too small to definitively characterize,  these are statistically favored to represent small cysts.  5. Mild colonic diverticulosis without evidence to suggest acute  diverticulitis at this time.  6. Normal appendix.  7. Additional incidental findings, as above.  Original Report Authenticated By: Trudie Reed, M.D.      Assessment:     1. 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 Adjuvant FOLFOX chemotherapy was initiated on 01/21/2012. The last cycle was completed on 06/30/2012. 2. Delayed nausea following cycle 2 FOLFOX. Aloxi was added  with cycle 3. Persistent delayed nausea. Emend was added with cycle 4. She noted improvement in the nausea following cycle 4. She declined steroid prophylaxis.  3. Tiny lung nodules seen on a CT of the chest 10/19/2011. 4. History of tobacco use 5. Endometrial thickening on CT of the abdomen 10/02/2011. 6. Port-A-Cath placement 01/09/2012. 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  8. Mild thrombocytopenia secondary to chemotherapy-resolved. 9. History of Neutropenia  secondary to chemotherapy 10. Hypokalemia on labs 04/28/2012. She is taking a potassium supplement. Potassium was in the low normal range on 05/26/2012 and 06/09/2012. Normal on 06/30/2012 11. 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 She was initially treated with Lovenox. She is currently on Coumadin. She is followed by the Coumadin clinic. 12. Anemia secondary to chemotherapy-improved       Plan:     Stable.  Return 1 year .

## 2013-06-12 ENCOUNTER — Ambulatory Visit (HOSPITAL_BASED_OUTPATIENT_CLINIC_OR_DEPARTMENT_OTHER): Payer: Medicare Other | Admitting: Nurse Practitioner

## 2013-06-12 ENCOUNTER — Telehealth: Payer: Self-pay | Admitting: Oncology

## 2013-06-12 ENCOUNTER — Other Ambulatory Visit: Payer: BC Managed Care – PPO

## 2013-06-12 VITALS — BP 129/63 | HR 68 | Temp 97.9°F | Resp 18 | Ht 65.0 in | Wt 171.0 lb

## 2013-06-12 DIAGNOSIS — C189 Malignant neoplasm of colon, unspecified: Secondary | ICD-10-CM | POA: Diagnosis not present

## 2013-06-12 NOTE — Telephone Encounter (Signed)
Called pt,left message regarding lab,md and CT for April 2015

## 2013-06-12 NOTE — Progress Notes (Signed)
OFFICE PROGRESS NOTE  Interval history:  Ms. Ethridge returns for followup of colon cancer. She reports persistent neuropathy symptoms in the hands and feet. She notes increased numbness and a cold sensation at the lower legs and feet. She has occasional numbness/tingling in the hands. She denies nausea/vomiting. No change in bowel habits. No bloody or black stools. She has occasional abdominal "cramps".   Objective: Blood pressure 129/63, pulse 68, temperature 97.9 F (36.6 C), temperature source Oral, resp. rate 18, height 5\' 5"  (1.651 m), weight 171 lb (77.565 kg).  Oropharynx is without thrush or ulceration. No palpable cervical, supraclavicular, axillary or inguinal lymph nodes. Lungs are clear. No wheezes or rales. Regular cardiac rhythm. Abdomen soft and nontender. No hepatomegaly. No mass. No leg edema.  Lab Results: Lab Results  Component Value Date   WBC 6.3 01/22/2013   HGB 12.4 01/22/2013   HCT 37.8 01/22/2013   MCV 89.2 01/22/2013   PLT 226 01/22/2013    Chemistry:    Chemistry      Component Value Date/Time   NA 142 12/19/2012 1303   NA 139 02/04/2012 1049   K 3.6 12/19/2012 1303   K 3.4* 02/04/2012 1049   CL 110* 09/19/2012 1157   CL 104 02/04/2012 1049   CO2 28 12/19/2012 1303   CO2 27 02/04/2012 1049   BUN 7.3 12/19/2012 1303   BUN 8 02/04/2012 1049   CREATININE 0.7 12/19/2012 1303   CREATININE 0.52 02/04/2012 1049      Component Value Date/Time   CALCIUM 10.0 12/19/2012 1303   CALCIUM 9.0 02/04/2012 1049   ALKPHOS 110 06/30/2012 0924   ALKPHOS 75 02/04/2012 1049   AST 22 06/30/2012 0924   AST 16 02/04/2012 1049   ALT 9 06/30/2012 0924   ALT 10 02/04/2012 1049   BILITOT 0.39 06/30/2012 0924   BILITOT 0.2* 02/04/2012 1049       Studies/Results: No results found.  Medications: I have reviewed the patient's current medications.  Assessment/Plan:  1. 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  Adjuvant FOLFOX chemotherapy was initiated on 01/21/2012. The last cycle was completed on 06/30/2012. Restaging CT scans 09/30/2012 showed no evidence of recurrent/metastatic disease. -Surveillance colonoscopy 02/24/2013 with a single hyperplastic polyp  2. Delayed nausea following cycle 2 FOLFOX. Aloxi was added with cycle 3. Persistent delayed nausea. Emend was added with cycle 4. She noted improvement in the nausea following cycle 4. She declined steroid prophylaxis.  3. Tiny lung nodules seen on a CT of the chest 10/19/2011. Stable on CT of the chest 09/30/2012. 4. History of tobacco use 5. Endometrial thickening on CT of the abdomen 10/02/2011. Progressive endometrial thickening on CT 09/30/2012. Status post removal of an endometrial polyp 02/03/2013 with benign pathology. 6. Port-A-Cath placement 01/09/2012 in interventional radiology. Removed 12/30/2012. 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 She declined a trial of gabapentin. 8. Mild thrombocytopenia secondary to chemotherapy-resolved. 9. History of neutropenia secondary to chemotherapy. 10. Hypokalemia on labs 04/28/2012. She is taking a potassium supplement. Potassium was in the low normal range on 05/26/2012 and 06/09/2012. Normal on 06/30/2012. 11. 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  12. Anemia secondary to chemotherapy-improved on 07/29/2012.  Disposition-she remains in clinical remission from colon cancer. We will followup on the CEA from today. She will undergo surveillance CT scans in April 2015 followed by an office visit approximately one week later.  She will contact the office prior to her next visit with any problems.  We made a referral at today's visit for her to establish with a primary care physician.  Plan reviewed with Dr. Truett Perna.   Lonna Cobb ANP/GNP-BC

## 2013-06-15 ENCOUNTER — Telehealth: Payer: Self-pay | Admitting: *Deleted

## 2013-06-15 NOTE — Telephone Encounter (Signed)
Left message for patient to call back regarding lab results.

## 2013-06-15 NOTE — Telephone Encounter (Signed)
Called and informed patient of normal cea.  Per Dr. Sherrill.  Patient verbalized understanding.  

## 2013-06-15 NOTE — Telephone Encounter (Signed)
Message copied by Raphael Gibney on Mon Jun 15, 2013  9:10 AM ------      Message from: Ladene Artist      Created: Sat Jun 13, 2013  9:10 PM       Please call patient, cea is normal ------

## 2013-06-23 ENCOUNTER — Telehealth: Payer: Self-pay | Admitting: Oncology

## 2013-06-23 ENCOUNTER — Telehealth: Payer: Self-pay | Admitting: *Deleted

## 2013-06-23 NOTE — Telephone Encounter (Signed)
, °

## 2013-06-23 NOTE — Telephone Encounter (Signed)
Notified patient that her appointment on 12/31 is not necessary.

## 2013-06-24 ENCOUNTER — Other Ambulatory Visit: Payer: BC Managed Care – PPO | Admitting: Lab

## 2013-06-24 ENCOUNTER — Ambulatory Visit: Payer: BC Managed Care – PPO

## 2013-06-24 ENCOUNTER — Other Ambulatory Visit: Payer: BC Managed Care – PPO

## 2013-10-12 ENCOUNTER — Ambulatory Visit (HOSPITAL_COMMUNITY)
Admission: RE | Admit: 2013-10-12 | Discharge: 2013-10-12 | Disposition: A | Discharge status: Home / Self Care | Payer: Medicare Other | Source: Ambulatory Visit | Attending: Nurse Practitioner | Admitting: Nurse Practitioner

## 2013-10-12 ENCOUNTER — Encounter (HOSPITAL_COMMUNITY): Payer: Self-pay

## 2013-10-12 ENCOUNTER — Other Ambulatory Visit (HOSPITAL_BASED_OUTPATIENT_CLINIC_OR_DEPARTMENT_OTHER): Payer: Medicare Other

## 2013-10-12 DIAGNOSIS — J438 Other emphysema: Secondary | ICD-10-CM | POA: Diagnosis not present

## 2013-10-12 DIAGNOSIS — K5710 Diverticulosis of small intestine without perforation or abscess without bleeding: Secondary | ICD-10-CM | POA: Insufficient documentation

## 2013-10-12 DIAGNOSIS — M47817 Spondylosis without myelopathy or radiculopathy, lumbosacral region: Secondary | ICD-10-CM | POA: Insufficient documentation

## 2013-10-12 DIAGNOSIS — R918 Other nonspecific abnormal finding of lung field: Secondary | ICD-10-CM | POA: Diagnosis not present

## 2013-10-12 DIAGNOSIS — C189 Malignant neoplasm of colon, unspecified: Secondary | ICD-10-CM | POA: Insufficient documentation

## 2013-10-12 DIAGNOSIS — C260 Malignant neoplasm of intestinal tract, part unspecified: Secondary | ICD-10-CM | POA: Diagnosis not present

## 2013-10-12 DIAGNOSIS — N281 Cyst of kidney, acquired: Secondary | ICD-10-CM | POA: Diagnosis not present

## 2013-10-12 DIAGNOSIS — I700 Atherosclerosis of aorta: Secondary | ICD-10-CM | POA: Insufficient documentation

## 2013-10-12 DIAGNOSIS — K7689 Other specified diseases of liver: Secondary | ICD-10-CM | POA: Diagnosis not present

## 2013-10-12 DIAGNOSIS — J984 Other disorders of lung: Secondary | ICD-10-CM | POA: Insufficient documentation

## 2013-10-12 DIAGNOSIS — Z9049 Acquired absence of other specified parts of digestive tract: Secondary | ICD-10-CM | POA: Insufficient documentation

## 2013-10-12 DIAGNOSIS — D739 Disease of spleen, unspecified: Secondary | ICD-10-CM | POA: Diagnosis not present

## 2013-10-12 LAB — CBC WITH DIFFERENTIAL/PLATELET
BASO%: 0.4 % (ref 0.0–2.0)
Basophils Absolute: 0 10*3/uL (ref 0.0–0.1)
EOS%: 4.6 % (ref 0.0–7.0)
Eosinophils Absolute: 0.3 10*3/uL (ref 0.0–0.5)
HCT: 40 % (ref 34.8–46.6)
HEMOGLOBIN: 13.3 g/dL (ref 11.6–15.9)
LYMPH#: 3.1 10*3/uL (ref 0.9–3.3)
LYMPH%: 47.2 % (ref 14.0–49.7)
MCH: 29.7 pg (ref 25.1–34.0)
MCHC: 33.2 g/dL (ref 31.5–36.0)
MCV: 89.5 fL (ref 79.5–101.0)
MONO#: 0.5 10*3/uL (ref 0.1–0.9)
MONO%: 7.2 % (ref 0.0–14.0)
NEUT#: 2.7 10*3/uL (ref 1.5–6.5)
NEUT%: 40.6 % (ref 38.4–76.8)
Platelets: 254 10*3/uL (ref 145–400)
RBC: 4.47 10*6/uL (ref 3.70–5.45)
RDW: 13.7 % (ref 11.2–14.5)
WBC: 6.6 10*3/uL (ref 3.9–10.3)

## 2013-10-12 LAB — COMPREHENSIVE METABOLIC PANEL (CC13)
ALBUMIN: 3.9 g/dL (ref 3.5–5.0)
ALT: 8 U/L (ref 0–55)
AST: 19 U/L (ref 5–34)
Alkaline Phosphatase: 112 U/L (ref 40–150)
Anion Gap: 10 mEq/L (ref 3–11)
BUN: 6.8 mg/dL — ABNORMAL LOW (ref 7.0–26.0)
CALCIUM: 9.9 mg/dL (ref 8.4–10.4)
CHLORIDE: 106 meq/L (ref 98–109)
CO2: 30 meq/L — AB (ref 22–29)
Creatinine: 0.8 mg/dL (ref 0.6–1.1)
Glucose: 90 mg/dl (ref 70–140)
POTASSIUM: 3.8 meq/L (ref 3.5–5.1)
SODIUM: 146 meq/L — AB (ref 136–145)
TOTAL PROTEIN: 7.5 g/dL (ref 6.4–8.3)
Total Bilirubin: 0.53 mg/dL (ref 0.20–1.20)

## 2013-10-12 MED ORDER — IOHEXOL 300 MG/ML  SOLN
100.0000 mL | Freq: Once | INTRAMUSCULAR | Status: AC | PRN
  Administered 2013-10-12: 100 mL via INTRAVENOUS

## 2013-10-13 LAB — CEA: CEA: 1 ng/mL (ref 0.0–5.0)

## 2013-10-15 ENCOUNTER — Ambulatory Visit (HOSPITAL_BASED_OUTPATIENT_CLINIC_OR_DEPARTMENT_OTHER): Payer: Medicare Other | Admitting: Oncology

## 2013-10-15 VITALS — BP 150/80 | HR 68 | Temp 97.5°F | Resp 18 | Ht 65.0 in | Wt 173.4 lb

## 2013-10-15 DIAGNOSIS — C184 Malignant neoplasm of transverse colon: Secondary | ICD-10-CM | POA: Diagnosis not present

## 2013-10-15 DIAGNOSIS — G620 Drug-induced polyneuropathy: Secondary | ICD-10-CM | POA: Diagnosis not present

## 2013-10-15 DIAGNOSIS — Z86718 Personal history of other venous thrombosis and embolism: Secondary | ICD-10-CM | POA: Diagnosis not present

## 2013-10-15 DIAGNOSIS — C189 Malignant neoplasm of colon, unspecified: Secondary | ICD-10-CM

## 2013-10-15 NOTE — Progress Notes (Signed)
  [facility] OFFICE PROGRESS NOTE   Diagnosis: Colon cancer  INTERVAL HISTORY:   She returns as scheduled. She feels well. She continues to have numbness in the lower legs and feet. The feet feel cold. This is worse at night. The finger numbness has improved.  Objective:  Vital signs in last 24 hours:  Blood pressure 150/80, pulse 68, temperature 97.5 F (36.4 C), temperature source Oral, resp. rate 18, height 5\' 5"  (1.651 m), weight 173 lb 6.4 oz (78.654 kg), SpO2 97.00%.    HEENT: Neck without mass Lymphatics: No cervical, suprapatellar, axillary, or inguinal nodes Resp: Lungs clear bilaterally Cardio: Regular rate and rhythm GI: No hepatomegaly, nontender, no mass Vascular: No leg edema Neuro: Sensation is intact to light touch over the feet    P  Lab Results:  Lab Results  Component Value Date   WBC 6.6 10/12/2013   HGB 13.3 10/12/2013   HCT 40.0 10/12/2013   MCV 89.5 10/12/2013   PLT 254 10/12/2013   NEUTROABS 2.7 10/12/2013    Lab Results  Component Value Date   CEA 1.0 10/12/2013    potassium 3.8  Imaging: CTs of the chest, abdomen, and pelvis on 10/12/2013: Stable small lung nodules compared to April 2013. No evidence of metastatic disease.  Medications: I have reviewed the patient's current medications.  Assessment/Plan: 1. 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 Adjuvant FOLFOX chemotherapy was initiated on 01/21/2012. The last cycle was completed on 06/30/2012. Restaging CT scans 09/30/2012 showed no evidence of recurrent/metastatic disease. -Surveillance colonoscopy 02/24/2013 with a single hyperplastic polyp -Surveillance CT scans 10/12/2013 with no evidence of metastatic disease  2. Delayed nausea following cycle 2 FOLFOX. Aloxi was added with cycle 3. Persistent delayed nausea. Emend was added with cycle 4. She noted improvement in the nausea following cycle  4. She declined steroid prophylaxis.  3. Tiny lung nodules seen on a CT of the chest 10/19/2011. Stable on CT of the chest 10/12/2013 4. History of tobacco use 5. Endometrial thickening on CT of the abdomen 10/02/2011. Progressive endometrial thickening on CT 09/30/2012. Status post removal of an endometrial polyp 02/03/2013 with benign pathology. 6. Port-A-Cath placement 01/09/2012 in interventional radiology. Removed 12/30/2012. 7. Oxaliplatin neuropathy with persistent in the lower legs and feet. The finger numbness has improved.  8. Mild thrombocytopenia secondary to chemotherapy-resolved. 9. History of neutropenia secondary to chemotherapy. 10. 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   Disposition:  She remains in clinical remission from colon cancer. Ms. Ofallon will return for an office visit and CEA in 6 months. Hopefully the neuropathy symptoms in the feet will improve. Ladell Pier, MD  10/15/2013  11:18 AM

## 2013-10-16 ENCOUNTER — Telehealth: Payer: Self-pay | Admitting: Oncology

## 2013-10-16 NOTE — Telephone Encounter (Signed)
Called pt and left message regarding appt for October lab and MD visit

## 2014-04-16 ENCOUNTER — Ambulatory Visit (HOSPITAL_BASED_OUTPATIENT_CLINIC_OR_DEPARTMENT_OTHER): Payer: Medicare Other | Admitting: Oncology

## 2014-04-16 ENCOUNTER — Other Ambulatory Visit (HOSPITAL_BASED_OUTPATIENT_CLINIC_OR_DEPARTMENT_OTHER): Payer: Medicare Other

## 2014-04-16 ENCOUNTER — Telehealth: Payer: Self-pay | Admitting: Oncology

## 2014-04-16 VITALS — BP 152/84 | HR 59 | Temp 98.2°F | Resp 18 | Ht 65.0 in | Wt 169.9 lb

## 2014-04-16 DIAGNOSIS — C189 Malignant neoplasm of colon, unspecified: Secondary | ICD-10-CM

## 2014-04-16 DIAGNOSIS — Z23 Encounter for immunization: Secondary | ICD-10-CM

## 2014-04-16 DIAGNOSIS — Z85038 Personal history of other malignant neoplasm of large intestine: Secondary | ICD-10-CM | POA: Diagnosis not present

## 2014-04-16 LAB — CEA

## 2014-04-16 MED ORDER — INFLUENZA VAC SPLIT QUAD 0.5 ML IM SUSY
0.5000 mL | PREFILLED_SYRINGE | Freq: Once | INTRAMUSCULAR | Status: AC
  Administered 2014-04-16: 0.5 mL via INTRAMUSCULAR
  Filled 2014-04-16: qty 0.5

## 2014-04-16 NOTE — Telephone Encounter (Signed)
gv and printed appt sched and avs for pt for April 2016...Marland Kitchenpt did not want barium at this time

## 2014-04-16 NOTE — Progress Notes (Signed)
  [name] OFFICE PROGRESS NOTE   Diagnosis: Colon cancer  INTERVAL HISTORY:   She returns as scheduled. She feels well. She continues to have numbness in the feet. Occasional numbness in the fingers. This does not interfere with activity. No other complaint.  Objective:  Vital signs in last 24 hours:  Blood pressure 163/85, pulse 61, temperature 98.2 F (36.8 C), temperature source Oral, resp. rate 18, height 5\' 5"  (1.651 m), weight 169 lb 14.4 oz (77.066 kg).    HEENT: Neck without mass Lymphatics: No cervical, supra-clavicular, axillary, or inguinal nodes Resp: Lungs clear bilaterally Cardio: Regular rate and rhythm GI: No hepatomegaly, nontender, no mass Vascular: No leg edema      Lab Results:   Lab Results  Component Value Date   CEA 1.0 10/12/2013     Medications: I have reviewed the patient's current medications.  Assessment/Plan: 1. 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 Adjuvant FOLFOX chemotherapy was initiated on 01/21/2012. The last cycle was completed on 06/30/2012. Restaging CT scans 09/30/2012 showed no evidence of recurrent/metastatic disease. -Surveillance colonoscopy 02/24/2013 with a single hyperplastic polyp  -Surveillance CT scans 10/12/2013 with no evidence of metastatic disease  2. Delayed nausea following cycle 2 FOLFOX. Aloxi was added with cycle 3. Persistent delayed nausea. Emend was added with cycle 4. She noted improvement in the nausea following cycle 4. She declined steroid prophylaxis.  3. Tiny lung nodules seen on a CT of the chest 10/19/2011. Stable on CT of the chest 10/12/2013 4. History of tobacco use 5. Endometrial thickening on CT of the abdomen 10/02/2011. Progressive endometrial thickening on CT 09/30/2012. Status post removal of an endometrial polyp 02/03/2013 with benign pathology. 6. Port-A-Cath placement 01/09/2012 in  interventional radiology. Removed 12/30/2012. 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 The finger numbness has improved.  8. Mild thrombocytopenia secondary to chemotherapy-resolved. 9. History of neutropenia secondary to chemotherapy. 10. 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   Disposition:  [name] remains in clinical remission from colon cancer. She will be scheduled for an office visit and restaging CT evaluation in 6 months. We will followup on the CEA from today. She received an influenza vaccine today.  Robin Coder, MD  04/16/2014  9:57 AM

## 2014-04-20 ENCOUNTER — Telehealth: Payer: Self-pay | Admitting: *Deleted

## 2014-04-20 NOTE — Telephone Encounter (Signed)
Message copied by Brien Few on Tue Apr 20, 2014 10:14 AM ------      Message from: Betsy Coder B      Created: Fri Apr 16, 2014  1:20 PM       Please call patient, cea is normal ------

## 2014-04-20 NOTE — Telephone Encounter (Signed)
Called pt with CEA results. Normal, per Dr. Benay Spice. She voiced understanding, appreciation for call.

## 2014-05-31 DIAGNOSIS — Z85038 Personal history of other malignant neoplasm of large intestine: Secondary | ICD-10-CM | POA: Diagnosis not present

## 2014-07-08 ENCOUNTER — Encounter (HOSPITAL_COMMUNITY): Payer: Self-pay | Admitting: Surgery

## 2014-10-14 ENCOUNTER — Encounter (HOSPITAL_COMMUNITY): Payer: Self-pay

## 2014-10-14 ENCOUNTER — Ambulatory Visit (HOSPITAL_COMMUNITY)
Admission: RE | Admit: 2014-10-14 | Discharge: 2014-10-14 | Disposition: A | Discharge status: Home / Self Care | Payer: Medicare Other | Source: Ambulatory Visit | Attending: Oncology | Admitting: Oncology

## 2014-10-14 ENCOUNTER — Other Ambulatory Visit (HOSPITAL_BASED_OUTPATIENT_CLINIC_OR_DEPARTMENT_OTHER): Payer: Medicare Other

## 2014-10-14 ENCOUNTER — Telehealth: Payer: Self-pay | Admitting: Oncology

## 2014-10-14 DIAGNOSIS — Z85038 Personal history of other malignant neoplasm of large intestine: Secondary | ICD-10-CM | POA: Diagnosis present

## 2014-10-14 DIAGNOSIS — C189 Malignant neoplasm of colon, unspecified: Secondary | ICD-10-CM | POA: Diagnosis not present

## 2014-10-14 DIAGNOSIS — R918 Other nonspecific abnormal finding of lung field: Secondary | ICD-10-CM | POA: Diagnosis not present

## 2014-10-14 LAB — BASIC METABOLIC PANEL (CC13)
ANION GAP: 12 meq/L — AB (ref 3–11)
BUN: 7 mg/dL (ref 7.0–26.0)
CHLORIDE: 105 meq/L (ref 98–109)
CO2: 26 mEq/L (ref 22–29)
Calcium: 9.7 mg/dL (ref 8.4–10.4)
Creatinine: 0.7 mg/dL (ref 0.6–1.1)
EGFR: >90 mL/min/{1.73_m2} (ref >90)
GLUCOSE: 97 mg/dL (ref 70–140)
POTASSIUM: 3.4 meq/L — AB (ref 3.5–5.1)
SODIUM: 144 meq/L (ref 136–145)

## 2014-10-14 MED ORDER — IOHEXOL 300 MG/ML  SOLN
100.0000 mL | Freq: Once | INTRAMUSCULAR | Status: AC | PRN
  Administered 2014-10-14: 100 mL via INTRAVENOUS

## 2014-10-14 NOTE — Telephone Encounter (Signed)
Pt stopped by today to moved 4/25 f/u to 4/26 due to she will be out of town.

## 2014-10-15 LAB — CEA: CEA: 1 ng/mL (ref 0.0–5.0)

## 2014-10-18 ENCOUNTER — Ambulatory Visit: Payer: Medicare Other | Admitting: Nurse Practitioner

## 2014-10-19 ENCOUNTER — Ambulatory Visit (HOSPITAL_BASED_OUTPATIENT_CLINIC_OR_DEPARTMENT_OTHER): Payer: Medicare Other | Admitting: Nurse Practitioner

## 2014-10-19 ENCOUNTER — Telehealth: Payer: Self-pay | Admitting: Nurse Practitioner

## 2014-10-19 VITALS — BP 169/89 | HR 71 | Temp 98.7°F | Resp 18 | Ht 65.0 in | Wt 170.4 lb

## 2014-10-19 DIAGNOSIS — R911 Solitary pulmonary nodule: Secondary | ICD-10-CM

## 2014-10-19 DIAGNOSIS — C189 Malignant neoplasm of colon, unspecified: Secondary | ICD-10-CM

## 2014-10-19 DIAGNOSIS — Z85038 Personal history of other malignant neoplasm of large intestine: Secondary | ICD-10-CM

## 2014-10-19 NOTE — Telephone Encounter (Signed)
Pt confirmed labs/ov per 04/26 POF, gave pt AVS and Calendar...  KJ °

## 2014-10-19 NOTE — Progress Notes (Signed)
  [facility] OFFICE PROGRESS NOTE   Diagnosis:  Colon cancer  INTERVAL HISTORY:   [name] returns as scheduled. She feels well. No change in bowel habits. No bloody or black stools. No nausea or vomiting. She denies abdominal pain. She has a good appetite and good energy level.  Objective:  Vital signs in last 24 hours:  Blood pressure 169/89, pulse 71, temperature 98.7 F (37.1 C), temperature source Oral, resp. rate 18, height 5\' 5"  (1.651 m), weight 170 lb 6.4 oz (77.293 kg), SpO2 99 %. repeat blood pressure 153/69    HEENT: No thrush or ulcers. Lymphatics: No palpable cervical, supraclavicular, axillary or inguinal lymph nodes. Resp: Lungs clear bilaterally. Cardio: Regular rate and rhythm. GI: Abdomen soft and nontender. No hepatomegaly. No mass. Vascular: No leg edema. Calves soft and nontender.   Lab Results:  Lab Results  Component Value Date   WBC 6.6 10/12/2013   HGB 13.3 10/12/2013   HCT 40.0 10/12/2013   MCV 89.5 10/12/2013   PLT 254 10/12/2013   NEUTROABS 2.7 10/12/2013   10/14/2014 CEA 1.0  Imaging:  No results found.  Medications: I have reviewed the patient's current medications.  Assessment/Plan: 1. 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 Adjuvant FOLFOX chemotherapy was initiated on 01/21/2012. The last cycle was completed on 06/30/2012. Restaging CT scans 09/30/2012 showed no evidence of recurrent/metastatic disease. -Surveillance colonoscopy 02/24/2013 with a single hyperplastic polyp. Next colonoscopy recommended at a 3 year interval. -Surveillance CT scans 10/12/2013 with no evidence of metastatic disease  -Surveillance CT scans 10/14/2014 with no evidence of metastatic disease 2. Delayed nausea following cycle 2 FOLFOX. Aloxi was added with cycle 3. Persistent delayed nausea. Emend was added with cycle 4. She noted improvement in the nausea  following cycle 4. She declined steroid prophylaxis.  3. Tiny lung nodules seen on a CT of the chest 10/19/2011. Stable on CT of the chest 10/12/2013 and 10/14/2014 4. History of tobacco use 5. Endometrial thickening on CT of the abdomen 10/02/2011. Progressive endometrial thickening on CT 09/30/2012. Status post removal of an endometrial polyp 02/03/2013 with benign pathology. 6. Port-A-Cath placement 01/09/2012 in interventional radiology. Removed 12/30/2012. 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 The finger numbness has improved.  8. Mild thrombocytopenia secondary to chemotherapy-resolved. 9. History of neutropenia secondary to chemotherapy. 10. 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   Disposition: Ms. Hauk remains in remission from colon cancer. She will return for a follow-up visit and CEA in 6 months. She will contact the office in the interim with any problems.    Ned Card ANP/GNP-BC   10/19/2014  2:12 PM

## 2015-01-06 ENCOUNTER — Encounter (HOSPITAL_COMMUNITY): Payer: Self-pay

## 2015-01-06 ENCOUNTER — Emergency Department (HOSPITAL_COMMUNITY)
Admission: EM | Admit: 2015-01-06 | Discharge: 2015-01-06 | Disposition: A | Discharge status: Home / Self Care | Payer: Medicare Other | Attending: Emergency Medicine | Admitting: Emergency Medicine

## 2015-01-06 DIAGNOSIS — Z85038 Personal history of other malignant neoplasm of large intestine: Secondary | ICD-10-CM | POA: Diagnosis not present

## 2015-01-06 DIAGNOSIS — W57XXXA Bitten or stung by nonvenomous insect and other nonvenomous arthropods, initial encounter: Secondary | ICD-10-CM | POA: Insufficient documentation

## 2015-01-06 DIAGNOSIS — Y9389 Activity, other specified: Secondary | ICD-10-CM | POA: Diagnosis not present

## 2015-01-06 DIAGNOSIS — L03116 Cellulitis of left lower limb: Secondary | ICD-10-CM | POA: Diagnosis not present

## 2015-01-06 DIAGNOSIS — Y998 Other external cause status: Secondary | ICD-10-CM | POA: Insufficient documentation

## 2015-01-06 DIAGNOSIS — S90562A Insect bite (nonvenomous), left ankle, initial encounter: Secondary | ICD-10-CM | POA: Diagnosis not present

## 2015-01-06 DIAGNOSIS — Z880 Allergy status to penicillin: Secondary | ICD-10-CM | POA: Diagnosis not present

## 2015-01-06 DIAGNOSIS — Z720 Tobacco use: Secondary | ICD-10-CM | POA: Diagnosis not present

## 2015-01-06 DIAGNOSIS — Y9289 Other specified places as the place of occurrence of the external cause: Secondary | ICD-10-CM | POA: Insufficient documentation

## 2015-01-06 MED ORDER — CLINDAMYCIN HCL 150 MG PO CAPS: 300.0000 mg | ORAL_CAPSULE | Freq: Three times a day (TID) | ORAL | Status: DC

## 2015-01-06 MED ORDER — CLINDAMYCIN HCL 300 MG PO CAPS
300.0000 mg | ORAL_CAPSULE | Freq: Once | ORAL | Status: AC
  Administered 2015-01-06: 300 mg via ORAL
  Filled 2015-01-06: qty 1

## 2015-01-06 MED ORDER — ACETAMINOPHEN 325 MG PO TABS
650.0000 mg | ORAL_TABLET | Freq: Once | ORAL | Status: AC
Start: 2015-01-06 — End: 2015-01-06
  Administered 2015-01-06: 650 mg via ORAL
  Filled 2015-01-06: qty 2
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
## 2015-01-06 NOTE — ED Provider Notes (Signed)
CSN: [id number]     Arrival date & time 01/06/15  1922 History  This chart was scribed for non-physician provider Alvina Chou, PA-C, working with Noemi Chapel, MD by Irene Pap, ED Scribe. This patient was seen in room WTR5/WTR5 and patient care was started at 9:02 PM.   Chief Complaint  Patient presents with  . Insect Bite   The history is provided by the patient. No language interpreter was used.  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 Pt states that she got out of her car and walked to the front door when she got bit on her ankle but did not see what bit her. She states that the ankle became painful and swollen immediately; rates pain 7/10. Denies itching, fever, or SOB. Reports allergies to NSAIDs and Benadryl; reports history of "chemical reactions" to drugs. Denies taking daily medications.   Past Medical History  Diagnosis Date  . colon cancer    Past Surgical History  Procedure Laterality Date  . Colon surgery    . Cholecystectomy     No family history on file. History  Substance Use Topics  . Smoking status: Current Every Day Smoker -- 0.50 packs/day    Types: Cigarettes  . Smokeless tobacco: Not on file  . Alcohol Use: No   OB History    No data available     Review of Systems  Constitutional: Negative for fever.  Respiratory: Negative for shortness of breath.   Musculoskeletal: Positive for joint swelling and arthralgias.  Skin: Positive for rash.  All other systems reviewed and are negative.  Allergies  Aleve and Penicillins  Home Medications   Prior to Admission medications   Medication Sig Start Date End Date Taking? Authorizing Provider  acetaminophen (TYLENOL) 500 MG tablet Take 1,000 mg by mouth every 6 (six) hours as needed for mild pain, moderate pain or headache.   Yes Historical  Provider, MD   BP 191/93 mmHg  Pulse 82  Temp(Src) 98.7 F (37.1 C) (Oral)  Resp 18  Ht 5\' 5"  (1.651 m)  Wt 161 lb (73.029 kg)  BMI 26.79 kg/m2  SpO2 100%  Physical Exam  Constitutional: She is oriented to person, place, and time. She appears well-developed and well-nourished. No distress.  HENT:  Head: Normocephalic and atraumatic.  Eyes: Conjunctivae and EOM are normal.  Neck: Normal range of motion. Neck supple.  Cardiovascular: Normal rate and regular rhythm.  Exam reveals no gallop and no friction rub.   No murmur heard. Pulmonary/Chest: Effort normal and breath sounds normal. She has no wheezes. She has no rales. She exhibits no tenderness.  Abdominal: Soft. There is no tenderness.  Musculoskeletal: Normal range of motion.  Neurological: She is alert and oriented to person, place, and time.  Speech is goal-oriented. Moves limbs without ataxia.   Skin: Skin is warm and dry.  Left lateral malleolar localized swelling and mild erythema. There is an isolated ren streak on the anterior ankle. No open wound.   Psychiatric: She has a normal mood and affect. Her behavior is normal.  Nursing note and vitals reviewed.   ED Course  Procedures (including critical care time) DIAGNOSTIC STUDIES: Oxygen Saturation is 100% on RA, normal by my interpretation.    COORDINATION OF CARE: 9:03 PM-Discussed treatment plan which includes anti-biotics, tylenol, RICE techniques, and follow up if  necessary with pt at bedside and pt agreed to plan.   Labs Review Labs Reviewed - No data to display  Imaging Review No results found.   EKG Interpretation None      MDM   Final diagnoses:  Cellulitis of left lower extremity  Insect bite    9:15 PM Patient appears to have cellulitis from insect bite that occurred prior to arrival. The affected area is localized. Vitals stable and patient afebrile. Patient reports she is allergic to benadryl so she will be treated with clindamycin.  Patient instructed to return with worsening or concerning symptoms.   I personally performed the services described in this documentation, which was scribed in my presence. The recorded information has been reviewed and is accurate.    Alvina Chou, PA-C 01/06/15 2116  Noemi Chapel, MD 01/06/15 [phone]

## 2015-01-06 NOTE — Discharge Instructions (Signed)
Take clindamycin as directed until gone. Take Tylenol as needed for pain. Return to the ED with worsening or concerning symptoms. Refer to attached documents for more information.

## 2015-04-19 ENCOUNTER — Encounter: Payer: Self-pay | Admitting: Oncology

## 2015-04-19 ENCOUNTER — Ambulatory Visit (HOSPITAL_BASED_OUTPATIENT_CLINIC_OR_DEPARTMENT_OTHER): Payer: Medicare Other | Admitting: Oncology

## 2015-04-19 ENCOUNTER — Telehealth: Payer: Self-pay | Admitting: Oncology

## 2015-04-19 ENCOUNTER — Other Ambulatory Visit (HOSPITAL_BASED_OUTPATIENT_CLINIC_OR_DEPARTMENT_OTHER): Payer: Medicare Other

## 2015-04-19 VITALS — BP 151/87 | HR 65 | Temp 98.5°F | Resp 18 | Ht 65.0 in | Wt 169.6 lb

## 2015-04-19 DIAGNOSIS — Z85038 Personal history of other malignant neoplasm of large intestine: Secondary | ICD-10-CM

## 2015-04-19 DIAGNOSIS — C189 Malignant neoplasm of colon, unspecified: Secondary | ICD-10-CM

## 2015-04-19 DIAGNOSIS — Z86718 Personal history of other venous thrombosis and embolism: Secondary | ICD-10-CM

## 2015-04-19 DIAGNOSIS — Z23 Encounter for immunization: Secondary | ICD-10-CM | POA: Diagnosis not present

## 2015-04-19 DIAGNOSIS — Z0000 Encounter for general adult medical examination without abnormal findings: Secondary | ICD-10-CM

## 2015-04-19 DIAGNOSIS — R911 Solitary pulmonary nodule: Secondary | ICD-10-CM

## 2015-04-19 MED ORDER — INFLUENZA VAC SPLIT QUAD 0.5 ML IM SUSY
0.5000 mL | PREFILLED_SYRINGE | Freq: Once | INTRAMUSCULAR | Status: AC
  Administered 2015-04-19: 0.5 mL via INTRAMUSCULAR
  Filled 2015-04-19: qty 0.5

## 2015-04-19 NOTE — Progress Notes (Signed)
  [facility] OFFICE PROGRESS NOTE   Diagnosis:  Colon cancer  INTERVAL HISTORY:   [name] returns as scheduled.  She feels well. No change in bowel habits. No bloody or black stools. No nausea or vomiting. She has a good appetite. She notes persistent neuropathy symptoms in the feet.  Objective:  Vital signs in last 24 hours:  Blood pressure 151/87, pulse 65, temperature 98.5 F (36.9 C), temperature source Oral, resp. rate 18, height 5\' 5"  (1.651 m), weight 169 lb 9.6 oz (76.93 kg), SpO2 99 %.    HEENT:  No thrush or ulcers. Lymphatics:  No palpable cervical, supraclavicular, axillary or inguinal lymph nodes. Resp:  Lungs clear bilaterally. Cardio:  Regular rate and rhythm. GI:  Abdomen soft and nontender. No hepatomegaly. No mass. Vascular:  No leg edema.  Lab Results:  Lab Results  Component Value Date   WBC 6.6 10/12/2013   HGB 13.3 10/12/2013   HCT 40.0 10/12/2013   MCV 89.5 10/12/2013   PLT 254 10/12/2013   NEUTROABS 2.7 10/12/2013    Imaging:  No results found.  Medications: I have reviewed the patient's current medications.  Assessment/Plan: 1. 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 Adjuvant FOLFOX chemotherapy was initiated on 01/21/2012. The last cycle was completed on 06/30/2012. Restaging CT scans 09/30/2012 showed no evidence of recurrent/metastatic disease. -Surveillance colonoscopy 02/24/2013 with a single hyperplastic polyp. Next colonoscopy recommended at a 3 year interval. -Surveillance CT scans 10/12/2013 with no evidence of metastatic disease  -Surveillance CT scans 10/14/2014 with no evidence of metastatic disease 2. Delayed nausea following cycle 2 FOLFOX. Aloxi was added with cycle 3. Persistent delayed nausea. Emend was added with cycle 4. She noted improvement in the nausea following cycle 4. She declined steroid prophylaxis.  3. Tiny lung  nodules seen on a CT of the chest 10/19/2011. Stable on CT of the chest 10/12/2013 and 10/14/2014 4. History of tobacco use 5. Endometrial thickening on CT of the abdomen 10/02/2011. Progressive endometrial thickening on CT 09/30/2012. Status post removal of an endometrial polyp 02/03/2013 with benign pathology. 6. Port-A-Cath placement 01/09/2012 in interventional radiology. Removed 12/30/2012. 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 The finger numbness has improved.  8. Mild thrombocytopenia secondary to chemotherapy-resolved. 9. History of neutropenia secondary to chemotherapy. 10. 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   Disposition: [name] remains in clinical remission from colon cancer. We will follow-up on the CEA from today. She will return for follow-up visit and CEA in 6 months.   She received the influenza vaccine at today's visit. We made a referral for her to establish with a primary care provider.    Ned Card ANP/GNP-BC   04/19/2015  3:37 PM

## 2015-04-19 NOTE — Telephone Encounter (Signed)
Gave adn printed appt sched and avs for pt for April  °

## 2015-04-20 LAB — CEA: CEA: 0.8 ng/mL (ref 0.0–5.0)

## 2015-04-21 ENCOUNTER — Telehealth: Payer: Self-pay | Admitting: Nurse Practitioner

## 2015-04-21 NOTE — Telephone Encounter (Signed)
As per Owens Shark call placed to Ms. Ulloa to inform her that her Cea results were normal and t F/U as scheduled. Pt. Verbalized understanding

## 2015-04-21 NOTE — Telephone Encounter (Signed)
-----   Message from Owens Shark, NP sent at 04/20/2015  9:04 AM EDT ----- Please let her know CEA is normal. F/U as scheduled.

## 2015-07-08 ENCOUNTER — Encounter: Payer: Self-pay | Admitting: Oncology

## 2015-08-08 ENCOUNTER — Encounter (HOSPITAL_COMMUNITY): Payer: Self-pay | Admitting: *Deleted

## 2015-08-08 ENCOUNTER — Emergency Department (HOSPITAL_COMMUNITY): Payer: Medicare Other

## 2015-08-08 ENCOUNTER — Inpatient Hospital Stay (HOSPITAL_COMMUNITY)
Admission: EM | Admit: 2015-08-08 | Discharge: 2015-08-12 | DRG: 872 | Disposition: A | Discharge status: Home / Self Care | Payer: Medicare Other | Attending: Internal Medicine | Admitting: Internal Medicine

## 2015-08-08 DIAGNOSIS — Z85038 Personal history of other malignant neoplasm of large intestine: Secondary | ICD-10-CM | POA: Diagnosis not present

## 2015-08-08 DIAGNOSIS — Z880 Allergy status to penicillin: Secondary | ICD-10-CM | POA: Diagnosis not present

## 2015-08-08 DIAGNOSIS — F17210 Nicotine dependence, cigarettes, uncomplicated: Secondary | ICD-10-CM | POA: Diagnosis present

## 2015-08-08 DIAGNOSIS — R6520 Severe sepsis without septic shock: Secondary | ICD-10-CM | POA: Diagnosis present

## 2015-08-08 DIAGNOSIS — Z79899 Other long term (current) drug therapy: Secondary | ICD-10-CM

## 2015-08-08 DIAGNOSIS — D649 Anemia, unspecified: Secondary | ICD-10-CM | POA: Diagnosis present

## 2015-08-08 DIAGNOSIS — K5730 Diverticulosis of large intestine without perforation or abscess without bleeding: Secondary | ICD-10-CM | POA: Diagnosis present

## 2015-08-08 DIAGNOSIS — A419 Sepsis, unspecified organism: Secondary | ICD-10-CM | POA: Diagnosis present

## 2015-08-08 DIAGNOSIS — Z886 Allergy status to analgesic agent status: Secondary | ICD-10-CM

## 2015-08-08 DIAGNOSIS — D72819 Decreased white blood cell count, unspecified: Secondary | ICD-10-CM | POA: Diagnosis present

## 2015-08-08 DIAGNOSIS — N12 Tubulo-interstitial nephritis, not specified as acute or chronic: Secondary | ICD-10-CM | POA: Diagnosis not present

## 2015-08-08 DIAGNOSIS — A4151 Sepsis due to Escherichia coli [E. coli]: Principal | ICD-10-CM | POA: Diagnosis present

## 2015-08-08 DIAGNOSIS — Z86718 Personal history of other venous thrombosis and embolism: Secondary | ICD-10-CM

## 2015-08-08 DIAGNOSIS — Z9049 Acquired absence of other specified parts of digestive tract: Secondary | ICD-10-CM | POA: Diagnosis not present

## 2015-08-08 DIAGNOSIS — E876 Hypokalemia: Secondary | ICD-10-CM | POA: Diagnosis present

## 2015-08-08 DIAGNOSIS — E860 Dehydration: Secondary | ICD-10-CM | POA: Diagnosis present

## 2015-08-08 DIAGNOSIS — N10 Acute tubulo-interstitial nephritis: Secondary | ICD-10-CM | POA: Diagnosis present

## 2015-08-08 DIAGNOSIS — N179 Acute kidney failure, unspecified: Secondary | ICD-10-CM | POA: Diagnosis not present

## 2015-08-08 DIAGNOSIS — R05 Cough: Secondary | ICD-10-CM | POA: Diagnosis not present

## 2015-08-08 DIAGNOSIS — R059 Cough, unspecified: Secondary | ICD-10-CM

## 2015-08-08 LAB — URINE MICROSCOPIC-ADD ON

## 2015-08-08 LAB — COMPREHENSIVE METABOLIC PANEL
ALT: 20 U/L (ref 14–54)
AST: 31 U/L (ref 15–41)
Albumin: 4.2 g/dL (ref 3.5–5.0)
Alkaline Phosphatase: 86 U/L (ref 38–126)
Anion gap: 9 (ref 5–15)
BUN: 14 mg/dL (ref 6–20)
CO2: 29 mmol/L (ref 22–32)
Calcium: 9.3 mg/dL (ref 8.9–10.3)
Chloride: 101 mmol/L (ref 101–111)
Creatinine, Ser: 1.16 mg/dL — ABNORMAL HIGH (ref 0.44–1.00)
GFR calc Af Amer: 55 mL/min — ABNORMAL LOW (ref >60)
GFR calc non Af Amer: 48 mL/min — ABNORMAL LOW (ref >60)
Glucose, Bld: 110 mg/dL — ABNORMAL HIGH (ref 65–99)
POTASSIUM: 3.5 mmol/L (ref 3.5–5.1)
Sodium: 139 mmol/L (ref 135–145)
Total Bilirubin: 0.5 mg/dL (ref 0.3–1.2)
Total Protein: 7.7 g/dL (ref 6.5–8.1)

## 2015-08-08 LAB — LIPASE, BLOOD: Lipase: 35 U/L (ref 11–51)

## 2015-08-08 LAB — CBC
HCT: 40.9 % (ref 36.0–46.0)
HEMOGLOBIN: 13.2 g/dL (ref 12.0–15.0)
MCH: 29.4 pg (ref 26.0–34.0)
MCHC: 32.3 g/dL (ref 30.0–36.0)
MCV: 91.1 fL (ref 78.0–100.0)
Platelets: 211 10*3/uL (ref 150–400)
RBC: 4.49 MIL/uL (ref 3.87–5.11)
RDW: 13.4 % (ref 11.5–15.5)
WBC: 3.7 10*3/uL — ABNORMAL LOW (ref 4.0–10.5)

## 2015-08-08 LAB — URINALYSIS, ROUTINE W REFLEX MICROSCOPIC
Bilirubin Urine: NEGATIVE
Glucose, UA: NEGATIVE mg/dL
Ketones, ur: 40 mg/dL — AB
Nitrite: NEGATIVE
Protein, ur: 100 mg/dL — AB
SPECIFIC GRAVITY, URINE: 1.018 (ref 1.005–1.030)
pH: 6 (ref 5.0–8.0)

## 2015-08-08 MED ORDER — SODIUM CHLORIDE 0.9 % IV BOLUS (SEPSIS)
1000.0000 mL | Freq: Once | INTRAVENOUS | Status: AC
  Administered 2015-08-08: 1000 mL via INTRAVENOUS

## 2015-08-08 MED ORDER — IOHEXOL 300 MG/ML  SOLN
100.0000 mL | Freq: Once | INTRAMUSCULAR | Status: AC | PRN
  Administered 2015-08-08: 100 mL via INTRAVENOUS

## 2015-08-08 MED ORDER — FENTANYL CITRATE (PF) 100 MCG/2ML IJ SOLN
50.0000 ug | Freq: Once | INTRAMUSCULAR | Status: AC
  Administered 2015-08-08: 50 ug via INTRAVENOUS
  Filled 2015-08-08: qty 2

## 2015-08-08 MED ORDER — ONDANSETRON HCL 4 MG/2ML IJ SOLN
4.0000 mg | Freq: Once | INTRAMUSCULAR | Status: AC
  Administered 2015-08-08: 4 mg via INTRAVENOUS
  Filled 2015-08-08: qty 2

## 2015-08-08 NOTE — ED Notes (Signed)
Pt reports generalized body aches, abd pain, lightheadedness since Friday.  Pt also reports a "little bit of diarrhea a few minutes ago."

## 2015-08-08 NOTE — ED Provider Notes (Signed)
CSN: [id number]     Arrival date & time 08/08/15  1239 History   First MD Initiated Contact with Patient 08/08/15 2027     Chief Complaint  Patient presents with  . Generalized Body Aches      The history is provided by the patient.   patient presents with right-sided generalized body aches and feeling bad. She's had slight nausea. He has had decreased oral intake but states she's had a good appetite. No dysuria. No fevers. States she aches all over and states she's felt bad for last couple days. She had a little diarrhea while she came to the ER. Previous history of colon cancer. She is clinically in remission.  Past Medical History  Diagnosis Date  . colon cancer   . Abdominal pain   . Cancer ([place]) 11/2011    cancer in colon  . DVT (deep venous thrombosis) ([place]) 05/2012    (R)IJ and subclavian, stopped Coumadin July 2014  . History of kidney stones   . Colon cancer ([place]) 12/18/2011   Past Surgical History  Procedure Laterality Date  . Colon surgery    . Cholecystectomy    . Goiter removal     . Kidney stone surgery    . Tonsillectomy      as child  . Colon resection  12/18/2011    Procedure: COLON RESECTION LAPAROSCOPIC;  Surgeon: Joyice Faster. Cornett, MD;  Location: [facility];  Service: General;  Laterality: N/A;  . Portacath placement  6/13  . Portacath removal  12/30/12  . Dilatation & currettage/hysteroscopy with resectocope N/A 02/03/2013    Procedure: DILATATION & CURETTAGE/HYSTEROSCOPY, removal  of endometrial mass;  Surgeon: Sharene Butters, MD;  Location: [facility];  Service: Gynecology;  Laterality: N/A;   Family History  Problem Relation Age of Onset  . Breast cancer Mother   . Heart disease Mother   . Hypertension Other   . Colon polyps Neg Hx   . Esophageal cancer Neg Hx   . Stomach cancer Neg Hx   . Rectal cancer Neg Hx    Social History  Substance Use Topics  . Smoking status: Current Some Day Smoker -- 0.25 packs/day for 35 years    Types: Cigarettes    Last  Attempt to Quit: 11/07/2012  . Smokeless tobacco: Never Used  . Alcohol Use: No   OB History    Gravida Para Term Preterm AB TAB SAB Ectopic Multiple Living   0 0 0 0 0 0 0 0       Review of Systems  Constitutional: Positive for appetite change and fatigue.  Respiratory: Negative for chest tightness.   Cardiovascular: Positive for chest pain.  Gastrointestinal: Negative for abdominal pain.  Musculoskeletal: Positive for myalgias.      Allergies  Aspirin; Advil; Aleve; and Penicillins  Home Medications   Prior to Admission medications   Medication Sig Start Date End Date Taking? Authorizing Provider  acetaminophen (TYLENOL) 500 MG tablet Take 1,000 mg by mouth every 6 (six) hours as needed for pain.   Yes Historical Provider, MD  pseudoephedrine-acetaminophen (TYLENOL SINUS) 30-500 MG TABS tablet Take 1 tablet by mouth every 4 (four) hours as needed.   Yes Historical Provider, MD  clindamycin (CLEOCIN) 150 MG capsule Take 2 capsules (300 mg total) by mouth 3 (three) times daily. May dispense as 150mg  capsules Patient not taking: Reported on 08/08/2015 01/06/15   Alvina Chou, PA-C   BP 151/63 mmHg  Pulse 86  Temp(Src) 98.2 F (36.8  C) (Oral)  Resp 16  Ht 5\' 6"  (1.676 m)  Wt 169 lb 8.5 oz (76.9 kg)  BMI 27.38 kg/m2  SpO2 97% Physical Exam  Constitutional: She appears well-developed.  Patient appears uncomfortable  HENT:  Head: Atraumatic.  Cardiovascular: Normal rate.   Pulmonary/Chest: Effort normal.  Abdominal: There is tenderness.  Moderate RUQ tenderness and Right flank tenderness.   Genitourinary:  CVA tenderness on right.   Musculoskeletal: Normal range of motion.  Neurological: She is alert.  Skin: Skin is warm.    ED Course  Procedures (including critical care time) Labs Review Labs Reviewed  COMPREHENSIVE METABOLIC PANEL - Abnormal; Notable for the following:    Glucose, Bld 110 (*)    Creatinine, Ser 1.16 (*)    GFR calc non Af Amer 48 (*)     GFR calc Af Amer 55 (*)    All other components within normal limits  CBC - Abnormal; Notable for the following:    WBC 3.7 (*)    All other components within normal limits  URINALYSIS, ROUTINE W REFLEX MICROSCOPIC (NOT AT [facility]) - Abnormal; Notable for the following:    Color, Urine AMBER (*)    APPearance TURBID (*)    Hgb urine dipstick MODERATE (*)    Ketones, ur 40 (*)    Protein, ur 100 (*)    Leukocytes, UA LARGE (*)    All other components within normal limits  URINE MICROSCOPIC-ADD ON - Abnormal; Notable for the following:    Squamous Epithelial / LPF 6-30 (*)    Bacteria, UA MANY (*)    All other components within normal limits  BASIC METABOLIC PANEL - Abnormal; Notable for the following:    Potassium 3.3 (*)    Glucose, Bld 110 (*)    Creatinine, Ser 1.35 (*)    Calcium 8.1 (*)    GFR calc non Af Amer 40 (*)    GFR calc Af Amer 46 (*)    All other components within normal limits  CBC WITH DIFFERENTIAL/PLATELET - Abnormal; Notable for the following:    RBC 3.85 (*)    Hemoglobin 11.5 (*)    HCT 35.2 (*)    All other components within normal limits  CBC - Abnormal; Notable for the following:    RBC 3.81 (*)    Hemoglobin 11.2 (*)    HCT 35.2 (*)    All other components within normal limits  BASIC METABOLIC PANEL - Abnormal; Notable for the following:    Calcium 8.6 (*)    All other components within normal limits  CULTURE, BLOOD (ROUTINE X 2)  CULTURE, BLOOD (ROUTINE X 2)  URINE CULTURE  LIPASE, BLOOD  LACTIC ACID, PLASMA  PROCALCITONIN  PROTIME-INR  APTT  LACTIC ACID, PLASMA  LACTIC ACID, PLASMA  GLUCOSE, CAPILLARY  GLUCOSE, CAPILLARY    Imaging Review No results found. I have personally reviewed and evaluated these images and lab results as part of my medical decision-making.   EKG Interpretation None      MDM   Final diagnoses:  Pyelonephritis    Patient presented with body aches. Found to have a pyelonephritis will admit to internal  medicine.    Davonna Belling, MD 08/11/15 [phone]

## 2015-08-09 ENCOUNTER — Encounter (HOSPITAL_COMMUNITY): Payer: Self-pay | Admitting: Family Medicine

## 2015-08-09 DIAGNOSIS — A419 Sepsis, unspecified organism: Secondary | ICD-10-CM | POA: Diagnosis present

## 2015-08-09 DIAGNOSIS — R6520 Severe sepsis without septic shock: Secondary | ICD-10-CM

## 2015-08-09 DIAGNOSIS — D72819 Decreased white blood cell count, unspecified: Secondary | ICD-10-CM | POA: Diagnosis present

## 2015-08-09 DIAGNOSIS — N179 Acute kidney failure, unspecified: Secondary | ICD-10-CM | POA: Diagnosis present

## 2015-08-09 LAB — LACTIC ACID, PLASMA
LACTIC ACID, VENOUS: 1 mmol/L (ref 0.5–2.0)
LACTIC ACID, VENOUS: 0.6 mmol/L (ref 0.5–2.0)
LACTIC ACID, VENOUS: 0.7 mmol/L (ref 0.5–2.0)

## 2015-08-09 LAB — CBC WITH DIFFERENTIAL/PLATELET
BASOS ABS: 0 10*3/uL (ref 0.0–0.1)
Basophils Relative: 0 %
EOS ABS: 0 10*3/uL (ref 0.0–0.7)
Eosinophils Relative: 0 %
HCT: 35.2 % — ABNORMAL LOW (ref 36.0–46.0)
HEMOGLOBIN: 11.5 g/dL — AB (ref 12.0–15.0)
LYMPHS ABS: 2.4 10*3/uL (ref 0.7–4.0)
Lymphocytes Relative: 51 %
MCH: 29.9 pg (ref 26.0–34.0)
MCHC: 32.7 g/dL (ref 30.0–36.0)
MCV: 91.4 fL (ref 78.0–100.0)
Monocytes Absolute: 0.5 10*3/uL (ref 0.1–1.0)
Monocytes Relative: 10 %
NEUTROS PCT: 39 %
Neutro Abs: 1.8 10*3/uL (ref 1.7–7.7)
Platelets: 184 10*3/uL (ref 150–400)
RBC: 3.85 MIL/uL — AB (ref 3.87–5.11)
RDW: 13.5 % (ref 11.5–15.5)
WBC: 4.7 10*3/uL (ref 4.0–10.5)

## 2015-08-09 LAB — BASIC METABOLIC PANEL
ANION GAP: 10 (ref 5–15)
BUN: 16 mg/dL (ref 6–20)
CHLORIDE: 104 mmol/L (ref 101–111)
CO2: 23 mmol/L (ref 22–32)
Calcium: 8.1 mg/dL — ABNORMAL LOW (ref 8.9–10.3)
Creatinine, Ser: 1.35 mg/dL — ABNORMAL HIGH (ref 0.44–1.00)
GFR calc Af Amer: 46 mL/min — ABNORMAL LOW (ref >60)
GFR, EST NON AFRICAN AMERICAN: 40 mL/min — AB (ref >60)
GLUCOSE: 110 mg/dL — AB (ref 65–99)
POTASSIUM: 3.3 mmol/L — AB (ref 3.5–5.1)
Sodium: 137 mmol/L (ref 135–145)

## 2015-08-09 LAB — GLUCOSE, CAPILLARY: Glucose-Capillary: 94 mg/dL (ref 65–99)

## 2015-08-09 LAB — APTT: aPTT: 35 seconds (ref 24–37)

## 2015-08-09 LAB — PROTIME-INR
INR: 1.08 (ref 0.00–1.49)
PROTHROMBIN TIME: 14.2 s (ref 11.6–15.2)

## 2015-08-09 LAB — PROCALCITONIN: Procalcitonin: 0.21 ng/mL

## 2015-08-09 MED ORDER — POTASSIUM CHLORIDE CRYS ER 20 MEQ PO TBCR
40.0000 meq | EXTENDED_RELEASE_TABLET | Freq: Once | ORAL | Status: AC
  Administered 2015-08-09: 40 meq via ORAL
  Filled 2015-08-09: qty 2

## 2015-08-09 MED ORDER — ENOXAPARIN SODIUM 40 MG/0.4ML ~~LOC~~ SOLN
40.0000 mg | Freq: Every day | SUBCUTANEOUS | Status: DC
  Administered 2015-08-09 – 2015-08-12 (×4): 40 mg via SUBCUTANEOUS
  Filled 2015-08-09 (×4): qty 0.4

## 2015-08-09 MED ORDER — LEVOFLOXACIN IN D5W 750 MG/150ML IV SOLN
750.0000 mg | INTRAVENOUS | Status: DC
Start: 2015-08-10 — End: 2015-08-10

## 2015-08-09 MED ORDER — AZTREONAM 2 G IJ SOLR
2.0000 g | Freq: Once | INTRAMUSCULAR | Status: AC
  Administered 2015-08-09: 2 g via INTRAVENOUS
  Filled 2015-08-09: qty 2

## 2015-08-09 MED ORDER — ONDANSETRON HCL 4 MG/2ML IJ SOLN: 4.0000 mg | Freq: Four times a day (QID) | INTRAMUSCULAR | Status: DC | PRN

## 2015-08-09 MED ORDER — POTASSIUM CHLORIDE IN NACL 20-0.9 MEQ/L-% IV SOLN
INTRAVENOUS | Status: AC
  Administered 2015-08-10: 03:00:00 via INTRAVENOUS
  Filled 2015-08-09 (×2): qty 1000

## 2015-08-09 MED ORDER — DEXTROSE 5 % IV SOLN
1.0000 g | Freq: Three times a day (TID) | INTRAVENOUS | Status: DC
  Administered 2015-08-09 – 2015-08-11 (×7): 1 g via INTRAVENOUS
  Filled 2015-08-09 (×9): qty 1

## 2015-08-09 MED ORDER — SENNOSIDES-DOCUSATE SODIUM 8.6-50 MG PO TABS
1.0000 | ORAL_TABLET | Freq: Every evening | ORAL | Status: DC | PRN
  Administered 2015-08-09: 1 via ORAL
  Filled 2015-08-09: qty 1

## 2015-08-09 MED ORDER — HYDROCODONE-ACETAMINOPHEN 5-325 MG PO TABS
1.0000 | ORAL_TABLET | ORAL | Status: DC | PRN
  Administered 2015-08-09 – 2015-08-11 (×4): 1 via ORAL
  Filled 2015-08-09 (×4): qty 1

## 2015-08-09 MED ORDER — SODIUM CHLORIDE 0.9 % IV BOLUS (SEPSIS)
1000.0000 mL | Freq: Once | INTRAVENOUS | Status: AC
  Administered 2015-08-09: 1000 mL via INTRAVENOUS

## 2015-08-09 MED ORDER — ACETAMINOPHEN 500 MG PO TABS: 1000.0000 mg | ORAL_TABLET | Freq: Four times a day (QID) | ORAL | Status: DC | PRN

## 2015-08-09 MED ORDER — LEVOFLOXACIN IN D5W 750 MG/150ML IV SOLN
750.0000 mg | Freq: Once | INTRAVENOUS | Status: AC
  Administered 2015-08-09: 750 mg via INTRAVENOUS
  Filled 2015-08-09: qty 150

## 2015-08-09 MED ORDER — BISACODYL 5 MG PO TBEC: 5.0000 mg | DELAYED_RELEASE_TABLET | Freq: Every day | ORAL | Status: DC | PRN

## 2015-08-09 MED ORDER — POTASSIUM CHLORIDE IN NACL 20-0.9 MEQ/L-% IV SOLN
INTRAVENOUS | Status: AC
  Administered 2015-08-09 (×2): via INTRAVENOUS
  Filled 2015-08-09 (×2): qty 1000

## 2015-08-09 MED ORDER — ONDANSETRON HCL 4 MG PO TABS: 4.0000 mg | ORAL_TABLET | Freq: Four times a day (QID) | ORAL | Status: DC | PRN

## 2015-08-09 NOTE — ED Notes (Signed)
Lovenox, 0.9% NaCl w/KCL 20 mEq, and aztreonam 2g IV haven't arrived from the pharmacy as of yet.

## 2015-08-09 NOTE — H&P (Signed)
[facility] History and Physical  [name] [id number] DOB: 03-22-1946 DOA: 08/08/2015  Referring physician: ED physician PCP: No PCP Per Patient  Specialists:  Dr. Benay Spice (oncology)   Chief Complaint:  Right flank pain, malaise  HPI: [name] is a 70 y.o. female with PMH of colon cancer status post partial colectomy in July 2013 and chemotherapy with FOLFOX, now in remission, who presents to the ED with 3 days of right flank pain and malaise. Patient describes herself as generally quite healthy and was in her usual state until 3 days ago when she developed right-sided flank pain and a general malaise. She described the pain as constant, 8/10 in severity, dull, localized to the right flank , and with no alleviating or exacerbating factors identified.  She has had some nausea but no vomiting. She denies fevers but notes chills for the past 1-2 days. She denies chest pain, palpitations, headaches, or confusion.  She has not been on antibiotics and there are no recent hospital admissions. She has a history of UTI with culture in our EMR which grew pan-sensitive Klebsiella.   In ED, patient was found to have temperature 37.7 C, saturating well on room air, and with heart rate in the low 100s. CMP was notable for a serum creatinine of 1.16, up from an apparent baseline of 0.7. CBC is remarkable for a leukopenia to a value of 3700. Urinalysis features many bacteria, large leukocyte, and negative nitrite.  She was bolused 1 L of normal saline in the emergency department and given a dose of IV Zofran for nausea. She remained hemodynamically stable in the ED and will be admitted to the hospital for ongoing evaluation and management of sepsis secondary to pyelonephritis.  Where does patient live?   At home  Can patient participate in ADLs?  Yes        Review of Systems:   General: no fevers, sweats, weight change, poor appetite, or fatigue. Chills HEENT: no blurry vision, hearing  changes or sore throat Pulm: no dyspnea, cough, or wheeze CV: no chest pain or palpitations Abd: no nausea, vomiting, diarrhea, or constipation. Right flank pain  GU: no dysuria, hematuria, increased urinary frequency, or urgency  Ext: no leg edema Neuro: no focal weakness, numbness, or tingling, no vision change or hearing loss Skin: no rash, no wounds MSK: No muscle spasm, no deformity, no red, hot, or swollen joint Heme: No easy bruising or bleeding Travel history: No recent long distant travel    Allergy:  Allergies  Allergen Reactions  . Aspirin Nausea And Vomiting  . Advil [Ibuprofen] Palpitations  . Aleve [Naproxen Sodium] Palpitations  . Penicillins Rash    Past Medical History  Diagnosis Date  . colon cancer   . Abdominal pain   . Cancer ([place]) 11/2011    cancer in colon  . DVT (deep venous thrombosis) ([place]) 05/2012    (R)IJ and subclavian, stopped Coumadin July 2014  . History of kidney stones   . Colon cancer ([place]) 12/18/2011    Past Surgical History  Procedure Laterality Date  . Colon surgery    . Cholecystectomy    . Goiter removal     . Kidney stone surgery    . Tonsillectomy      as child  . Colon resection  12/18/2011    Procedure: COLON RESECTION LAPAROSCOPIC;  Surgeon: Joyice Faster. Cornett, MD;  Location: [place];  Service: General;  Laterality: N/A;  . Portacath placement  6/13  .  Portacath removal  12/30/12  . Dilatation & currettage/hysteroscopy with resectocope N/A 02/03/2013    Procedure: DILATATION & CURETTAGE/HYSTEROSCOPY, removal  of endometrial mass;  Surgeon: Sharene Butters, MD;  Location: [facility];  Service: Gynecology;  Laterality: N/A;    Social History:  reports that she has been smoking Cigarettes.  She has a 8.75 pack-year smoking history. She has never used smokeless tobacco. She reports that she does not drink alcohol or use illicit drugs.  Family History:  Family History  Problem Relation Age of Onset  . Breast cancer Mother   . Heart  disease Mother   . Hypertension Other   . Colon polyps Neg Hx   . Esophageal cancer Neg Hx   . Stomach cancer Neg Hx   . Rectal cancer Neg Hx      Prior to Admission medications   Medication Sig Start Date End Date Taking? Authorizing Provider  acetaminophen (TYLENOL) 500 MG tablet Take 1,000 mg by mouth every 6 (six) hours as needed for pain.   Yes Historical Provider, MD  pseudoephedrine-acetaminophen (TYLENOL SINUS) 30-500 MG TABS tablet Take 1 tablet by mouth every 4 (four) hours as needed.   Yes Historical Provider, MD  clindamycin (CLEOCIN) 150 MG capsule Take 2 capsules (300 mg total) by mouth 3 (three) times daily. May dispense as 150mg  capsules Patient not taking: Reported on 08/08/2015 01/06/15   Alvina Chou, PA-C    Physical Exam: Filed Vitals:   08/08/15 1313 08/08/15 2007 08/08/15 2228  BP: 103/49 101/59 118/64  Pulse: 104 93 73  Temp: 99.8 F (37.7 C) 98.9 F (37.2 C) 98.6 F (37 C)  TempSrc: Oral Oral Oral  Resp:  18 19  SpO2: 96% 96% 93%   General: Not in acute distress HEENT:       Eyes: PERRL, EOMI, no scleral icterus or conjunctival pallor.       ENT: No discharge from the ears or nose, no pharyngeal ulcers, petechiae or exudate, no tonsillar enlargement.        Neck: No JVD, no bruit, no appreciable mass Heme: No cervical adenopathy, no pallor Cardiac: S1/S2, RRR, No murmurs, No gallops or rubs. Pulm: Good air movement bilaterally. No rales, wheezing, rhonchi or rubs. Abd: Soft, nondistended, mild tenderness at lower quadrants, no rebound pain or gaurding, no mass or organomegaly, BS present. Ext: No LE edema bilaterally. 2+DP/PT pulse bilaterally. Musculoskeletal: No gross deformity, no red, hot, swollen joints, no limitation in ROM  Skin: No rashes or wounds on exposed surfaces  Neuro: Alert, oriented X3, cranial nerves II-XII grossly intact. No focal findings Psych: Patient is not overtly psychotic, appropriate mood and affect.  Labs on  Admission:  Basic Metabolic Panel:  Recent Labs Lab 08/08/15 1431  NA 139  K 3.5  CL 101  CO2 29  GLUCOSE 110*  BUN 14  CREATININE 1.16*  CALCIUM 9.3   Liver Function Tests:  Recent Labs Lab 08/08/15 1431  AST 31  ALT 20  ALKPHOS 86  BILITOT 0.5  PROT 7.7  ALBUMIN 4.2    Recent Labs Lab 08/08/15 1431  LIPASE 35   No results for input(s): AMMONIA in the last 168 hours. CBC:  Recent Labs Lab 08/08/15 1431  WBC 3.7*  HGB 13.2  HCT 40.9  MCV 91.1  PLT 211   Cardiac Enzymes: No results for input(s): CKTOTAL, CKMB, CKMBINDEX, TROPONINI in the last 168 hours.  BNP (last 3 results) No results for input(s): BNP in the last 8760 hours.  ProBNP (last 3 results) No results for input(s): PROBNP in the last 8760 hours.  CBG: No results for input(s): GLUCAP in the last 168 hours.  Radiological Exams on Admission: Ct Abdomen Pelvis W Contrast  08/08/2015  CLINICAL DATA:  70 year old female with right lower quadrant abdominal pain EXAM: CT ABDOMEN AND PELVIS WITH CONTRAST TECHNIQUE: Multidetector CT imaging of the abdomen and pelvis was performed using the standard protocol following bolus administration of intravenous contrast. CONTRAST:  192mL OMNIPAQUE IOHEXOL 300 MG/ML  SOLN COMPARISON:  CT dated 10/14/2014 FINDINGS: Minimal bibasilar atelectasis/ scarring. The visualized lung bases are clear. No intra-abdominal free air. Trace free fluid may be present within the pelvis. Cholecystectomy. Small stable appearing probable cyst in the left lobe of the liver. Stable focus of enhancement in the dome of the liver is not characterized on this noncontrast study. There is mild dilatation of the CBD, likely post cholecystectomy. The pancreas appears unremarkable. Small stable splenic hypodensity is not characterized but may represent a cyst or hemangioma. The adrenal glands appear unremarkable. Stable subcentimeter right renal hypodensity, likely a cyst. Smaller right renal  inferior pole hypodense lesion is too small to characterize. There is no hydronephrosis on either side. There is duplicated appearance of the left renal collecting system. There is mild heterogeneous enhancement of the kidneys concerning for pyelonephritis. Correlation with urinalysis recommended. There is haziness of the urothelium of the renal pelvis as well as diffuse thickening of the wall of the bladder most compatible with urinary tract infection. The small calcified uterine fibroid noted. There is extensive sigmoid diverticulosis with muscular hypertrophy. No active inflammatory changes. There postoperative changes of partial colon resection with anastomotic suture in the distal transverse colon. No evidence of bowel obstruction. There is a 3 cm duodenal diverticulum. Normal appendix. There is atherosclerotic calcification of the abdominal aorta. No portal venous gas identified. There is no adenopathy. There is a small fat containing umbilical hernia. The abdominal wall soft tissues are otherwise unremarkable. There is degenerative changes of the spine. No acute fracture. IMPRESSION: Findings most compatible with urinary tract infection and pyelonephritis. Correlation with urinalysis and cultures recommended. No abscess. Extensive colonic diverticulosis. No evidence of bowel obstruction or inflammation. Normal appendix. Nonacute findings as described above. Electronically Signed   By: Anner Crete M.D.   On: 08/08/2015 22:29    EKG:  Not done in ED, will obtain as appropriate   Assessment/Plan  1. Sepsis secondary to pyelonephritis  - Presents with tachycardia, leukopenia, and AKI secondary to acute pyelonephritis  - Urine culture sent, will follow-up  - Pt penicillin-allergic  - Empiric treatment with aztreonam and Levaquin while awaiting culture data  2. Acute kidney injury  - Likely a prerenal azotemia in setting of sepsis and decreased perfusion, ATN also possible  - SCr 1.16 on  arrival, up from apparent baseline of 0.7  - Received contrast media for CT abd - Gently hydrate with IVF  - Avoid any further nephrotoxic agents where feasible  - Trend renal fxn    3. Leukopenia  - Suspected secondary to acute infection  - Anticipate recovery with treatment of infection  - If fails to resolve as expected, may require further work-up     DVT ppx:  SQ Lovenox      Code Status: Full code Family Communication: None at bed side.           Disposition Plan: Admit to inpatient   Date of Service 08/09/2015    Vianne Bulls, MD  [facility] Pager [phone]  If 7PM-7AM, please contact night-coverage [email or web address] Password [email or web address] 08/09/2015, 1:18 AM

## 2015-08-09 NOTE — ED Notes (Signed)
Pt asleep but easily awoken.  Pt given update and has call bell in reach.  Will continue to monitor.

## 2015-08-09 NOTE — Progress Notes (Signed)
PROGRESS NOTE    [name]  [id number]  DOB: 12-Jan-1946  DOA: 08/08/2015 PCP: No PCP Per Patient Outpatient Specialists: Oncology: Dr. Orma Render course: 70 year old female patient with history of colon cancer status post partial colectomy July 2013 and chemotherapy with FOLFOX, now in remission, DVT-completed Coumadin, UTI, presented to [facility] ED on 08/08/15 with complaints of right flank pain and malaise of 3 days' duration. She had nausea but no vomiting. Noted chills but no fevers. She was admitted for sepsis secondary to possible UTI.   Assessment & Plan:   1. Sepsis secondary to possible acute pyelonephritis: Sepsis physiology present on admission including tachycardia, leukopenia and positive urine microscopy. Patient is penicillin allergic. Started empirically on aztreonam and levofloxacin pending culture results. CT abdomen and pelvis: Findings most compatible with UTI and pyelonephritis. 2. Acute kidney injury: Likely prerenal from dehydration. Serum creatinine 1.6 on arrival. Baseline 0.7. Creatinine has increased to 1.35. Did receive contrast for CT abdomen. Hydrate with IV fluids and follow BMP in a.m. 3. Hypokalemia: Replace and follow. 4. Extensive colonic diverticulosis: Seen on CT abdomen. 5. Anemia: May be dilutional or related to sepsis. Follow CBC in a.m. 6. Leukopenia: Mild and resolved. 7. Colon cancer status post partial colectomy and chemotherapy: Said to be in remission.  DVT prophylaxis: Lovenox Code Status: Full Family Communication: None at bedside Disposition Plan: DC home when medically stable   Consultants:  None  Procedures:  None  Antimicrobials:  IV aztreonam 2/13 >  Levofloxacin 2/13 >   Subjective: Feels better. Feels stronger. Complains of mild right flank region pain. Chills.  Objective: Filed Vitals:   08/09/15 0331 08/09/15 0449 08/09/15 0945 08/09/15 1400  BP: 109/58 109/64 130/63 127/62    Pulse: 86 80 71 80  Temp: 99.1 F (37.3 C) 98 F (36.7 C) 98.3 F (36.8 C) 98.6 F (37 C)  TempSrc: Oral Oral Oral Oral  Resp: 16 16  18   Height:      Weight:      SpO2: 96% 98% 99% 98%    Intake/Output Summary (Last 24 hours) at 08/09/15 1750 Last data filed at 08/09/15 1519  Gross per 24 hour  Intake 1053.33 ml  Output      0 ml  Net 1053.33 ml   Filed Weights   08/09/15 0240 08/09/15 0330  Weight: 76.9 kg (169 lb 8.5 oz) 76.9 kg (169 lb 8.5 oz)    Exam:  General exam: Pleasant middle-aged female lying comfortably in bed. Does not look septic or toxic. Respiratory system: Clear. No increased work of breathing. Cardiovascular system: S1 & S2 heard, RRR. No JVD, murmurs, gallops, clicks or pedal edema. Gastrointestinal system: Abdomen is nondistended, soft and nontender. Normal bowel sounds heard. Mild right flank/renal angle tenderness. Central nervous system: Alert and oriented. No focal neurological deficits. Extremities: Symmetric 5 x 5 power.   Data Reviewed: Basic Metabolic Panel:  Recent Labs Lab 08/08/15 1431 08/09/15 0313  NA 139 137  K 3.5 3.3*  CL 101 104  CO2 29 23  GLUCOSE 110* 110*  BUN 14 16  CREATININE 1.16* 1.35*  CALCIUM 9.3 8.1*   Liver Function Tests:  Recent Labs Lab 08/08/15 1431  AST 31  ALT 20  ALKPHOS 86  BILITOT 0.5  PROT 7.7  ALBUMIN 4.2    Recent Labs Lab 08/08/15 1431  LIPASE 35   No results for input(s): AMMONIA in the last 168 hours. CBC:  Recent Labs Lab 08/08/15 1431 08/09/15  0313  WBC 3.7* 4.7  NEUTROABS  --  1.8  HGB 13.2 11.5*  HCT 40.9 35.2*  MCV 91.1 91.4  PLT 211 184   Cardiac Enzymes: No results for input(s): CKTOTAL, CKMB, CKMBINDEX, TROPONINI in the last 168 hours. BNP (last 3 results) No results for input(s): PROBNP in the last 8760 hours. CBG:  Recent Labs Lab 08/09/15 1054  GLUCAP 94    Recent Results (from the past 240 hour(s))  Culture, blood (x 2)     Status: None  (Preliminary result)   Collection Time: 08/09/15  1:48 AM  Result Value Ref Range Status   Specimen Description BLOOD LEFT FOREARM  Final   Special Requests BOTTLES DRAWN AEROBIC AND ANAEROBIC 4ML  Final   Culture PENDING  Incomplete   Report Status PENDING  Incomplete         Studies: Ct Abdomen Pelvis W Contrast  08/08/2015  CLINICAL DATA:  70 year old female with right lower quadrant abdominal pain EXAM: CT ABDOMEN AND PELVIS WITH CONTRAST TECHNIQUE: Multidetector CT imaging of the abdomen and pelvis was performed using the standard protocol following bolus administration of intravenous contrast. CONTRAST:  14mL OMNIPAQUE IOHEXOL 300 MG/ML  SOLN COMPARISON:  CT dated 10/14/2014 FINDINGS: Minimal bibasilar atelectasis/ scarring. The visualized lung bases are clear. No intra-abdominal free air. Trace free fluid may be present within the pelvis. Cholecystectomy. Small stable appearing probable cyst in the left lobe of the liver. Stable focus of enhancement in the dome of the liver is not characterized on this noncontrast study. There is mild dilatation of the CBD, likely post cholecystectomy. The pancreas appears unremarkable. Small stable splenic hypodensity is not characterized but may represent a cyst or hemangioma. The adrenal glands appear unremarkable. Stable subcentimeter right renal hypodensity, likely a cyst. Smaller right renal inferior pole hypodense lesion is too small to characterize. There is no hydronephrosis on either side. There is duplicated appearance of the left renal collecting system. There is mild heterogeneous enhancement of the kidneys concerning for pyelonephritis. Correlation with urinalysis recommended. There is haziness of the urothelium of the renal pelvis as well as diffuse thickening of the wall of the bladder most compatible with urinary tract infection. The small calcified uterine fibroid noted. There is extensive sigmoid diverticulosis with muscular hypertrophy. No  active inflammatory changes. There postoperative changes of partial colon resection with anastomotic suture in the distal transverse colon. No evidence of bowel obstruction. There is a 3 cm duodenal diverticulum. Normal appendix. There is atherosclerotic calcification of the abdominal aorta. No portal venous gas identified. There is no adenopathy. There is a small fat containing umbilical hernia. The abdominal wall soft tissues are otherwise unremarkable. There is degenerative changes of the spine. No acute fracture. IMPRESSION: Findings most compatible with urinary tract infection and pyelonephritis. Correlation with urinalysis and cultures recommended. No abscess. Extensive colonic diverticulosis. No evidence of bowel obstruction or inflammation. Normal appendix. Nonacute findings as described above. Electronically Signed   By: Anner Crete M.D.   On: 08/08/2015 22:29        Scheduled Meds: . aztreonam  1 g Intravenous 3 times per day  . enoxaparin (LOVENOX) injection  40 mg Subcutaneous Daily  . [START ON 08/10/2015] levofloxacin (LEVAQUIN) IV  750 mg Intravenous Q48H   Continuous Infusions:    Principal Problem:   Pyelonephritis Active Problems:   Leukopenia   Severe sepsis ([place])   Acute kidney injury ([place])    Time spent: 25 minutes.    Vernell Leep, MD, FACP, FHM.  [facility] Pager [phone]  If 7PM-7AM, please contact night-coverage [email or web address] Password [email or web address] 08/09/2015, 5:50 PM    LOS: 1 day

## 2015-08-09 NOTE — Progress Notes (Signed)
Utilization Review completed.  

## 2015-08-09 NOTE — Progress Notes (Addendum)
Pharmacy Antibiotic Note  HALO CARDINALI is a 70 y.o. female admitted on 08/08/2015 with UTI.  Pharmacy has been consulted for Levaquin/Azactam dosing.  Plan: Azactam 2gm x1 then 1Gm IV q8h Levaquin 750mg  IV q48h     Temp (24hrs), Avg:99.1 F (37.3 C), Min:98.6 F (37 C), Max:99.8 F (37.7 C)   Recent Labs Lab 08/08/15 1431  WBC 3.7*  CREATININE 1.16*    CrCl cannot be calculated (Unknown ideal weight.).    Allergies  Allergen Reactions  . Aspirin Nausea And Vomiting  . Advil [Ibuprofen] Palpitations  . Aleve [Naproxen Sodium] Palpitations  . Penicillins Rash    Antimicrobials this admission: 2/14 azactam >>  2/14 levaquin >>   Dose adjustments this admission:   Microbiology results:  BCx:   UCx:    Sputum:    MRSA PCR:   Thank you for allowing pharmacy to be a part of this patient's care.  Dorrene German 08/09/2015 1:36 AM

## 2015-08-10 DIAGNOSIS — D72819 Decreased white blood cell count, unspecified: Secondary | ICD-10-CM

## 2015-08-10 DIAGNOSIS — N179 Acute kidney failure, unspecified: Secondary | ICD-10-CM

## 2015-08-10 DIAGNOSIS — N12 Tubulo-interstitial nephritis, not specified as acute or chronic: Secondary | ICD-10-CM

## 2015-08-10 LAB — BASIC METABOLIC PANEL
ANION GAP: 7 (ref 5–15)
BUN: 9 mg/dL (ref 6–20)
CALCIUM: 8.6 mg/dL — AB (ref 8.9–10.3)
CO2: 23 mmol/L (ref 22–32)
CREATININE: 0.5 mg/dL (ref 0.44–1.00)
Chloride: 107 mmol/L (ref 101–111)
GLUCOSE: 87 mg/dL (ref 65–99)
Potassium: 4.4 mmol/L (ref 3.5–5.1)
Sodium: 137 mmol/L (ref 135–145)

## 2015-08-10 LAB — GLUCOSE, CAPILLARY: Glucose-Capillary: 87 mg/dL (ref 65–99)

## 2015-08-10 LAB — CBC
HEMATOCRIT: 35.2 % — AB (ref 36.0–46.0)
Hemoglobin: 11.2 g/dL — ABNORMAL LOW (ref 12.0–15.0)
MCH: 29.4 pg (ref 26.0–34.0)
MCHC: 31.8 g/dL (ref 30.0–36.0)
MCV: 92.4 fL (ref 78.0–100.0)
PLATELETS: 189 10*3/uL (ref 150–400)
RBC: 3.81 MIL/uL — ABNORMAL LOW (ref 3.87–5.11)
RDW: 13.7 % (ref 11.5–15.5)
WBC: 4.2 10*3/uL (ref 4.0–10.5)

## 2015-08-10 MED ORDER — LEVOFLOXACIN IN D5W 750 MG/150ML IV SOLN
750.0000 mg | INTRAVENOUS | Status: DC
  Administered 2015-08-10: 750 mg via INTRAVENOUS
  Filled 2015-08-10: qty 150

## 2015-08-10 NOTE — Progress Notes (Signed)
Pharmacy Antibiotic Note  [name] is a 70 y.o. female admitted on 08/08/2015 with UTI, rule out sepsis.  Pharmacy has been consulted for Levaquin/Azactam dosing.  Plan: Azactam 2gm x1 then 1Gm IV q8h Levaquin 750mg  IV q48h  Height: 5\' 6"  (167.6 cm) Weight: 169 lb 8.5 oz (76.9 kg) IBW/kg (Calculated) : 59.3  Temp (24hrs), Avg:98.8 F (37.1 C), Min:98.3 F (36.8 C), Max:99.5 F (37.5 C)   Recent Labs Lab 08/08/15 1431 08/09/15 0232 08/09/15 0313 08/09/15 0428 08/09/15 0719 08/10/15 0417  WBC 3.7*  --  4.7  --   --  4.2  CREATININE 1.16*  --  1.35*  --   --  0.50  LATICACIDVEN  --  1.0  --  0.6 0.7  --     Estimated Creatinine Clearance: 71.4 mL/min (by C-G formula based on Cr of 0.5).    Allergies  Allergen Reactions  . Aspirin Nausea And Vomiting  . Advil [Ibuprofen] Palpitations  . Aleve [Naproxen Sodium] Palpitations  . Penicillins Rash   Antimicrobials this admission: 2/14 azactam >>  2/14 levaquin >>   Dose adjustments this admission: 2/15: Levaquin 750mg  q48hr increased to q24 hr  Microbiology results: 2/14 BCx: sent 2/13 UCx:  sent    Thank you for allowing pharmacy to be a part of this patient's care.  Minda Ditto PharmD Pager [phone] 08/10/2015, 8:42 AM

## 2015-08-10 NOTE — Progress Notes (Signed)
PROGRESS NOTE    [name]  [id number]  DOB: May 15, 1946  DOA: 08/08/2015 PCP: No PCP Per Patient Outpatient Specialists: Oncology: Dr. Orma Render course: 70 year old female patient with history of colon cancer status post partial colectomy July 2013 and chemotherapy with FOLFOX, now in remission, DVT-completed Coumadin, UTI, presented to [facility] ED on 08/08/15 with complaints of right flank pain and malaise of 3 days' duration. She had nausea but no vomiting. Noted chills but no fevers. She was admitted for sepsis secondary to  UTI.   Assessment & Plan:  Sepsis secondary to Escherichia coli acute pyelonephritis:  - Sepsis physiology present on admission including tachycardia, leukopenia and positive urine microscopy. -  Patient is penicillin allergic. Started empirically on aztreonam and levofloxacin pending culture results.  -  CT abdomen and pelvis: Findings most compatible with UTI and pyelonephritis. -  Urine culture growing Escherichia coli, sensitivities pending, blood cultures with no growth to date  Acute kidney injury:  - Likely prerenal from dehydration. Serum creatinine 1.6 on arrival. Baseline 0.7. Did receive contrast for CT abdomen. Hydrate with IV fluids and follow BMP in a.m. - Resolved  Hypokalemia:  - Repleted  Extensive colonic diverticulosis:  - Seen on CT abdomen.  Anemia: -  May be dilutional or related to sepsis. Hemoglobin remained stable  Leukopenia: -  Mild and resolved.  Colon cancer status post partial colectomy and chemotherapy:  Said to be in remission.  DVT prophylaxis: Lovenox Code Status: Full Family Communication: None at bedside Disposition Plan: DC home when medically stable   Consultants:  None  Procedures:  None  Antimicrobials:  IV aztreonam 2/13 >  Levofloxacin 2/13 >   Subjective: Feels better. Feels stronger. Complains of mild right flank region pain. Chills.  Objective: Filed  Vitals:   08/09/15 0945 08/09/15 1400 08/10/15 0553 08/10/15 1358  BP: 130/63 127/62 128/67 144/70  Pulse: 71 80 85 69  Temp: 98.3 F (36.8 C) 98.6 F (37 C) 99.5 F (37.5 C) 98.2 F (36.8 C)  TempSrc: Oral Oral Oral Oral  Resp:  18 16 16   Height:      Weight:      SpO2: 99% 98% 97% 98%    Intake/Output Summary (Last 24 hours) at 08/10/15 1550 Last data filed at 08/10/15 0555  Gross per 24 hour  Intake 1413.33 ml  Output      0 ml  Net 1413.33 ml   Filed Weights   08/09/15 0240 08/09/15 0330  Weight: 76.9 kg (169 lb 8.5 oz) 76.9 kg (169 lb 8.5 oz)    Exam:  General exam: Pleasant middle-aged female lying comfortably in bed. In no apparent distress Respiratory system: Clear. No increased work of breathing, good air entry bilaterally Cardiovascular system: S1 & S2 heard, RRR. No JVD, murmurs, gallops, clicks or pedal edema. Gastrointestinal system: Abdomen is nondistended, soft and nontender. Normal bowel sounds heard. Bilateral CVA tenderness, right more than left  Central nervous system: Alert and oriented. No focal neurological deficits. Extremities: Symmetric 5 x 5 power.   Data Reviewed: Basic Metabolic Panel:  Recent Labs Lab 08/08/15 1431 08/09/15 0313 08/10/15 0417  NA 139 137 137  K 3.5 3.3* 4.4  CL 101 104 107  CO2 29 23 23   GLUCOSE 110* 110* 87  BUN 14 16 9   CREATININE 1.16* 1.35* 0.50  CALCIUM 9.3 8.1* 8.6*   Liver Function Tests:  Recent Labs Lab 08/08/15 1431  AST 31  ALT 20  ALKPHOS  86  BILITOT 0.5  PROT 7.7  ALBUMIN 4.2    Recent Labs Lab 08/08/15 1431  LIPASE 35   No results for input(s): AMMONIA in the last 168 hours. CBC:  Recent Labs Lab 08/08/15 1431 08/09/15 0313 08/10/15 0417  WBC 3.7* 4.7 4.2  NEUTROABS  --  1.8  --   HGB 13.2 11.5* 11.2*  HCT 40.9 35.2* 35.2*  MCV 91.1 91.4 92.4  PLT 211 184 189   Cardiac Enzymes: No results for input(s): CKTOTAL, CKMB, CKMBINDEX, TROPONINI in the last 168 hours. BNP  (last 3 results) No results for input(s): PROBNP in the last 8760 hours. CBG:  Recent Labs Lab 08/09/15 1054 08/10/15 0808  GLUCAP 94 87    Recent Results (from the past 240 hour(s))  Urine culture     Status: None (Preliminary result)   Collection Time: 08/08/15  2:32 PM  Result Value Ref Range Status   Specimen Description URINE, RANDOM  Final   Special Requests NONE  Final   Culture   Final    >=100,000 COLONIES/mL ESCHERICHIA COLI Performed at [facility]    Report Status PENDING  Incomplete  Culture, blood (x 2)     Status: None (Preliminary result)   Collection Time: 08/09/15  1:48 AM  Result Value Ref Range Status   Specimen Description BLOOD LEFT FOREARM  Final   Special Requests BOTTLES DRAWN AEROBIC AND ANAEROBIC 4ML  Final   Culture   Final    NO GROWTH 1 DAY Performed at [facility]    Report Status PENDING  Incomplete  Culture, blood (x 2)     Status: None (Preliminary result)   Collection Time: 08/09/15  2:30 AM  Result Value Ref Range Status   Specimen Description BLOOD RIGHT ANTECUBITAL  Final   Special Requests IN PEDIATRIC BOTTLE 2ML  Final   Culture   Final    NO GROWTH 1 DAY Performed at [facility]    Report Status PENDING  Incomplete         Studies: Ct Abdomen Pelvis W Contrast  08/08/2015  CLINICAL DATA:  70 year old female with right lower quadrant abdominal pain EXAM: CT ABDOMEN AND PELVIS WITH CONTRAST TECHNIQUE: Multidetector CT imaging of the abdomen and pelvis was performed using the standard protocol following bolus administration of intravenous contrast. CONTRAST:  120mL OMNIPAQUE IOHEXOL 300 MG/ML  SOLN COMPARISON:  CT dated 10/14/2014 FINDINGS: Minimal bibasilar atelectasis/ scarring. The visualized lung bases are clear. No intra-abdominal free air. Trace free fluid may be present within the pelvis. Cholecystectomy. Small stable appearing probable cyst in the left lobe of the liver. Stable focus of enhancement  in the dome of the liver is not characterized on this noncontrast study. There is mild dilatation of the CBD, likely post cholecystectomy. The pancreas appears unremarkable. Small stable splenic hypodensity is not characterized but may represent a cyst or hemangioma. The adrenal glands appear unremarkable. Stable subcentimeter right renal hypodensity, likely a cyst. Smaller right renal inferior pole hypodense lesion is too small to characterize. There is no hydronephrosis on either side. There is duplicated appearance of the left renal collecting system. There is mild heterogeneous enhancement of the kidneys concerning for pyelonephritis. Correlation with urinalysis recommended. There is haziness of the urothelium of the renal pelvis as well as diffuse thickening of the wall of the bladder most compatible with urinary tract infection. The small calcified uterine fibroid noted. There is extensive sigmoid diverticulosis with muscular hypertrophy. No active inflammatory changes.  There postoperative changes of partial colon resection with anastomotic suture in the distal transverse colon. No evidence of bowel obstruction. There is a 3 cm duodenal diverticulum. Normal appendix. There is atherosclerotic calcification of the abdominal aorta. No portal venous gas identified. There is no adenopathy. There is a small fat containing umbilical hernia. The abdominal wall soft tissues are otherwise unremarkable. There is degenerative changes of the spine. No acute fracture. IMPRESSION: Findings most compatible with urinary tract infection and pyelonephritis. Correlation with urinalysis and cultures recommended. No abscess. Extensive colonic diverticulosis. No evidence of bowel obstruction or inflammation. Normal appendix. Nonacute findings as described above. Electronically Signed   By: Anner Crete M.D.   On: 08/08/2015 22:29        Scheduled Meds: . aztreonam  1 g Intravenous 3 times per day  . enoxaparin (LOVENOX)  injection  40 mg Subcutaneous Daily  . levofloxacin (LEVAQUIN) IV  750 mg Intravenous Q24H   Continuous Infusions:    Principal Problem:   Pyelonephritis Active Problems:   Leukopenia   Severe sepsis ([place])   Acute kidney injury ([place])    Time spent: 25 minutes.    Phillips Climes, MD [facility] Pager [phone]  If 7PM-7AM, please contact night-coverage [email or web address] Password [email or web address] 08/10/2015, 3:50 PM    LOS: 2 days

## 2015-08-11 ENCOUNTER — Inpatient Hospital Stay (HOSPITAL_COMMUNITY): Payer: Medicare Other

## 2015-08-11 LAB — URINE CULTURE: Culture: >=100000

## 2015-08-11 LAB — GLUCOSE, CAPILLARY: GLUCOSE-CAPILLARY: 103 mg/dL — AB (ref 65–99)

## 2015-08-11 MED ORDER — LEVOFLOXACIN IN D5W 500 MG/100ML IV SOLN
500.0000 mg | INTRAVENOUS | Status: DC
  Administered 2015-08-11: 500 mg via INTRAVENOUS
  Filled 2015-08-11 (×2): qty 100

## 2015-08-11 MED ORDER — GUAIFENESIN-CODEINE 100-10 MG/5ML PO SOLN: 5.0000 mL | Freq: Four times a day (QID) | ORAL | Status: DC | PRN

## 2015-08-11 NOTE — Progress Notes (Signed)
PT Cancellation Note  Patient Details Name: [name] MRN: [id number] DOB: 03/29/1946   Cancelled Treatment:    Reason Eval/Treat Not Completed: PT screened, no needs identified, will sign off   Claretha Cooper 08/11/2015, 8:35 AM

## 2015-08-11 NOTE — Progress Notes (Signed)
Patient ambulated with standby assistance from this RN up and down hallway on unit - approximately 450 feet. No complaints of dizziness or lightheadedness. Patient states she just feels fatigued.

## 2015-08-11 NOTE — Progress Notes (Signed)
PROGRESS NOTE    [name]  [id number]  DOB: September 08, 1945  DOA: 08/08/2015 PCP: No PCP Per Patient Outpatient Specialists: Oncology: Dr. Orma Render course: 70 year old female patient with history of colon cancer status post partial colectomy July 2013 and chemotherapy with FOLFOX, now in remission, DVT-completed Coumadin, UTI, presented to [facility] ED on 08/08/15 with complaints of right flank pain and malaise of 3 days' duration. She had nausea but no vomiting. Noted chills but no fevers. She was admitted for sepsis secondary to  UTI/acute pyelonephritis.   Assessment & Plan:  Sepsis secondary to Escherichia coli acute pyelonephritis:  - Sepsis physiology present on admission including tachycardia, leukopenia and positive urine microscopy. -  Patient is penicillin allergic. Started empirically on aztreonam and levofloxacin  -  CT abdomen and pelvis: Findings most compatible with UTI and pyelonephritis, no evidence of abscess or stones. -  Urine culture growing Escherichia coli, sensitive to levofloxacin, will discontinue azactam, continue with levofloxacin.  Acute kidney injury:  - Likely prerenal from dehydration. Serum creatinine 1.6 on arrival. Baseline 0.7. Did receive contrast for CT abdomen. Hydrate with IV fluids and follow BMP in a.m. - Resolved  Hypokalemia:  - Repleted  Extensive colonic diverticulosis:  - Seen on CT abdomen.  Anemia: -  May be dilutional or related to sepsis. Hemoglobin remained stable  Leukopenia: -  Mild and resolved.  Colon cancer status post partial colectomy and chemotherapy:  Said to be in remission.  DVT prophylaxis: Lovenox Code Status: Full Family Communication: None at bedside Disposition Plan: DC home in 24 hours if continues to improve   Consultants:  None  Procedures:  None  Antimicrobials:  IV aztreonam 2/13 > 2/16  Levofloxacin 2/13 >   Subjective: Feels better. Feels stronger.  Complains of mild right flank region pain. Chills.  Objective: Filed Vitals:   08/10/15 1358 08/10/15 2215 08/11/15 0519 08/11/15 1416  BP: 144/70 131/63 151/63 149/69  Pulse: 69 68 86 72  Temp: 98.2 F (36.8 C) 98.5 F (36.9 C) 98.2 F (36.8 C) 98.4 F (36.9 C)  TempSrc: Oral Oral Oral Oral  Resp: 16 16 16 18   Height:      Weight:      SpO2: 98% 97% 97% 97%    Intake/Output Summary (Last 24 hours) at 08/11/15 1732 Last data filed at 08/11/15 1418  Gross per 24 hour  Intake   1320 ml  Output      0 ml  Net   1320 ml   Filed Weights   08/09/15 0240 08/09/15 0330  Weight: 76.9 kg (169 lb 8.5 oz) 76.9 kg (169 lb 8.5 oz)    Exam:  General exam: Pleasant middle-aged female lying comfortably in bed. In no apparent distress Respiratory system: Clear. No increased work of breathing, good air entry bilaterally Cardiovascular system: S1 & S2 heard, RRR. No JVD, murmurs, gallops, clicks or pedal edema. Gastrointestinal system: Abdomen is nondistended, soft and nontender. Normal bowel sounds heard. Mild right CVA tenderness, improving  Central nervous system: Alert and oriented. No focal neurological deficits. Extremities: Symmetric 5 x 5 power.   Data Reviewed: Basic Metabolic Panel:  Recent Labs Lab 08/08/15 1431 08/09/15 0313 08/10/15 0417  NA 139 137 137  K 3.5 3.3* 4.4  CL 101 104 107  CO2 29 23 23   GLUCOSE 110* 110* 87  BUN 14 16 9   CREATININE 1.16* 1.35* 0.50  CALCIUM 9.3 8.1* 8.6*   Liver Function Tests:  Recent Labs  Lab 08/08/15 1431  AST 31  ALT 20  ALKPHOS 86  BILITOT 0.5  PROT 7.7  ALBUMIN 4.2    Recent Labs Lab 08/08/15 1431  LIPASE 35   No results for input(s): AMMONIA in the last 168 hours. CBC:  Recent Labs Lab 08/08/15 1431 08/09/15 0313 08/10/15 0417  WBC 3.7* 4.7 4.2  NEUTROABS  --  1.8  --   HGB 13.2 11.5* 11.2*  HCT 40.9 35.2* 35.2*  MCV 91.1 91.4 92.4  PLT 211 184 189   Cardiac Enzymes: No results for input(s):  CKTOTAL, CKMB, CKMBINDEX, TROPONINI in the last 168 hours. BNP (last 3 results) No results for input(s): PROBNP in the last 8760 hours. CBG:  Recent Labs Lab 08/09/15 1054 08/10/15 0808 08/11/15 0842  GLUCAP 94 87 103*    Recent Results (from the past 240 hour(s))  Urine culture     Status: None   Collection Time: 08/08/15  2:32 PM  Result Value Ref Range Status   Specimen Description URINE, RANDOM  Final   Special Requests NONE  Final   Culture   Final    >=100,000 COLONIES/mL ESCHERICHIA COLI Performed at [facility]    Report Status 08/11/2015 FINAL  Final   Organism ID, Bacteria ESCHERICHIA COLI  Final      Susceptibility   Escherichia coli - MIC*    AMPICILLIN <=2 SENSITIVE Sensitive     CEFAZOLIN <=4 SENSITIVE Sensitive     CEFTRIAXONE <=1 SENSITIVE Sensitive     CIPROFLOXACIN <=0.25 SENSITIVE Sensitive     GENTAMICIN <=1 SENSITIVE Sensitive     IMIPENEM <=0.25 SENSITIVE Sensitive     NITROFURANTOIN <=16 SENSITIVE Sensitive     TRIMETH/SULFA <=20 SENSITIVE Sensitive     AMPICILLIN/SULBACTAM <=2 SENSITIVE Sensitive     PIP/TAZO <=4 SENSITIVE Sensitive     * >=100,000 COLONIES/mL ESCHERICHIA COLI  Culture, blood (x 2)     Status: None (Preliminary result)   Collection Time: 08/09/15  1:48 AM  Result Value Ref Range Status   Specimen Description BLOOD LEFT FOREARM  Final   Special Requests BOTTLES DRAWN AEROBIC AND ANAEROBIC 4ML  Final   Culture   Final    NO GROWTH 2 DAYS Performed at [facility]    Report Status PENDING  Incomplete  Culture, blood (x 2)     Status: None (Preliminary result)   Collection Time: 08/09/15  2:30 AM  Result Value Ref Range Status   Specimen Description BLOOD RIGHT ANTECUBITAL  Final   Special Requests IN PEDIATRIC BOTTLE 2ML  Final   Culture   Final    NO GROWTH 2 DAYS Performed at [facility]    Report Status PENDING  Incomplete         Studies: No results found.      Scheduled Meds: .  enoxaparin (LOVENOX) injection  40 mg Subcutaneous Daily  . levofloxacin (LEVAQUIN) IV  500 mg Intravenous Q24H   Continuous Infusions:    Principal Problem:   Pyelonephritis Active Problems:   Leukopenia   Severe sepsis ([place])   Acute kidney injury ([place])    Time spent: 20 minutes.    Phillips Climes, MD [facility] Pager [phone]  If 7PM-7AM, please contact night-coverage [email or web address] Password [email or web address] 08/11/2015, 5:32 PM    LOS: 3 days

## 2015-08-11 NOTE — Care Management Important Message (Signed)
Important Message  Patient Details  Name: [name] MRN: [id number] Date of Birth: 1945-07-14   Medicare Important Message Given:  Yes    Camillo Flaming 08/11/2015, 2:57 [place] Message  Patient Details  Name: [name] MRN: [id number] Date of Birth: 01/11/1946   Medicare Important Message Given:  Yes    Camillo Flaming 08/11/2015, 2:57 PM

## 2015-08-12 MED ORDER — LEVOFLOXACIN 500 MG PO TABS: 500.0000 mg | ORAL_TABLET | Freq: Every day | ORAL | Status: DC

## 2015-08-12 MED ORDER — SACCHAROMYCES BOULARDII 250 MG PO CAPS: 250.0000 mg | ORAL_CAPSULE | Freq: Two times a day (BID) | ORAL | Status: DC

## 2015-08-12 MED FILL — levoFLOXacin 500 MG TABS: 500 | 7 days supply | Qty: 7 | Fill #0

## 2015-08-12 NOTE — Discharge Summary (Signed)
[name], is a 70 y.o. female  DOB [birth year]  MRN [id number].  Admission date:  08/08/2015  Admitting Physician  Vianne Bulls, MD  Discharge Date:  08/12/2015   Primary MD  No PCP Per Patient  Recommendations for primary care physician for things to follow:  - please check CBC, BMP on discharge.   Admission Diagnosis  Pyelonephritis [N12]   Discharge Diagnosis  Pyelonephritis [N12]   Principal Problem:   Pyelonephritis Active Problems:   Leukopenia   Severe sepsis ([place])   Acute kidney injury [facility])      Past Medical History  Diagnosis Date  . colon cancer   . Abdominal pain   . Cancer ([place]) 11/2011    cancer in colon  . DVT (deep venous thrombosis) ([place]) 05/2012    (R)IJ and subclavian, stopped Coumadin July 2014  . History of kidney stones   . Colon cancer ([place]) 12/18/2011    Past Surgical History  Procedure Laterality Date  . Colon surgery    . Cholecystectomy    . Goiter removal     . Kidney stone surgery    . Tonsillectomy      as child  . Colon resection  12/18/2011    Procedure: COLON RESECTION LAPAROSCOPIC;  Surgeon: Joyice Faster. Cornett, MD;  Location: [facility];  Service: General;  Laterality: N/A;  . Portacath placement  6/13  . Portacath removal  12/30/12  . Dilatation & currettage/hysteroscopy with resectocope N/A 02/03/2013    Procedure: DILATATION & CURETTAGE/HYSTEROSCOPY, removal  of endometrial mass;  Surgeon: Sharene Butters, MD;  Location: [facility];  Service: Gynecology;  Laterality: N/A;       History of present illness and  Hospital Course:     Kindly see H&P for history of present illness and admission details, please review complete Labs, Consult reports and Test reports for all details in brief  HPI  from the history and physical done on the day of admission 08/09/2015  HPI: [name] is a 70 y.o. female with PMH of colon cancer status post partial  colectomy in July 2013 and chemotherapy with FOLFOX, now in remission, who presents to the ED with 3 days of right flank pain and malaise. Patient describes herself as generally quite healthy and was in her usual state until 3 days ago when she developed right-sided flank pain and a general malaise. She described the pain as constant, 8/10 in severity, dull, localized to the right flank , and with no alleviating or exacerbating factors identified. She has had some nausea but no vomiting. She denies fevers but notes chills for the past 1-2 days. She denies chest pain, palpitations, headaches, or confusion. She has not been on antibiotics and there are no recent hospital admissions. She has a history of UTI with culture in our EMR which grew pan-sensitive Klebsiella.   In ED, patient was found to have temperature 37.7 C, saturating well on room air, and with heart rate in the low 100s. CMP was notable  for a serum creatinine of 1.16, up from an apparent baseline of 0.7. CBC is remarkable for a leukopenia to a value of 3700. Urinalysis features many bacteria, large leukocyte, and negative nitrite. She was bolused 1 L of normal saline in the emergency department and given a dose of IV Zofran for nausea. She remained hemodynamically stable in the ED and will be admitted to the hospital for ongoing evaluation and management of sepsis secondary to pyelonephritis.   Hospital Course  70 year old female patient with history of colon cancer status post partial colectomy July 2013 and chemotherapy with FOLFOX, now in remission, DVT-completed Coumadin, UTI, presented to [facility] ED on 08/08/15 with complaints of right flank pain and malaise of 3 days' duration. She had nausea but no vomiting. Noted chills but no fevers. She was admitted for sepsis secondary to UTI/acute pyelonephritis.  Sepsis secondary to Escherichia coli acute pyelonephritis:  - Sepsis physiology present on admission including  tachycardia, leukopenia and positive urine microscopy. - Patient is penicillin allergic. Started empirically on aztreonam and levofloxacin  - CT abdomen and pelvis: Findings most compatible with UTI and pyelonephritis, no evidence of abscess or stones. - Urine culture growing Escherichia coli, sensitive to levofloxacin, stopped azactam, continue with levofloxacin, to finish another 7 days of by mouth levofloxacin as an outpatient, total of 10 days treatment.  Acute kidney injury:  - Likely prerenal from dehydration. Serum creatinine 1.6 on arrival. Baseline 0.7. Did receive contrast for CT abdomen.  - Resolved with IV fluids  Hypokalemia:  - Repleted  Extensive colonic diverticulosis:  - Seen on CT abdomen.  Anemia: - May be dilutional or related to sepsis. Hemoglobin remained stable  Leukopenia: - Mild and resolved.  Colon cancer status post partial colectomy and chemotherapy:  Said to be in remission.   Discharge Condition:  Stable  Discussed with son 2/16 via phone  Follow UP      Discharge Instructions  and  Discharge Medications     Discharge Instructions    Discharge instructions    Complete by:  As directed   Follow with Primary MD  in 7 days   Get CBC, CMP, checked  by Primary MD next visit.    Activity: As tolerated with Full fall precautions use walker/cane & assistance as needed   Disposition Home   Diet: Heart Healthy  , with feeding assistance and aspiration precautions.  For Heart failure patients - Check your Weight same time everyday, if you gain over 2 pounds, or you develop in leg swelling, experience more shortness of breath or chest pain, call your Primary MD immediately. Follow Cardiac Low Salt Diet and 1.5 lit/day fluid restriction.   On your next visit with your primary care physician please Get Medicines reviewed and adjusted.   Please request your Prim.MD to go over all Hospital Tests and Procedure/Radiological results at  the follow up, please get all Hospital records sent to your Prim MD by signing hospital release before you go home.   If you experience worsening of your admission symptoms, develop shortness of breath, life threatening emergency, suicidal or homicidal thoughts you must seek medical attention immediately by calling 911 or calling your MD immediately  if symptoms less severe.  You Must read complete instructions/literature along with all the possible adverse reactions/side effects for all the Medicines you take and that have been prescribed to you. Take any new Medicines after you have completely understood and accpet all the possible adverse reactions/side effects.   Do  not drive, operating heavy machinery, perform activities at heights, swimming or participation in water activities or provide baby sitting services if your were admitted for syncope or siezures until you have seen by Primary MD or a Neurologist and advised to do so again.  Do not drive when taking Pain medications.    Do not take more than prescribed Pain, Sleep and Anxiety Medications  Special Instructions: If you have smoked or chewed Tobacco  in the last 2 yrs please stop smoking, stop any regular Alcohol  and or any Recreational drug use.  Wear Seat belts while driving.   Please note  You were cared for by a hospitalist during your hospital stay. If you have any questions about your discharge medications or the care you received while you were in the hospital after you are discharged, you can call the unit and asked to speak with the hospitalist on call if the hospitalist that took care of you is not available. Once you are discharged, your primary care physician will handle any further medical issues. Please note that NO REFILLS for any discharge medications will be authorized once you are discharged, as it is imperative that you return to your primary care physician (or establish a relationship with a primary care physician  if you do not have one) for your aftercare needs so that they can reassess your need for medications and monitor your lab values.     Increase activity slowly    Complete by:  As directed             Medication List    STOP taking these medications        acetaminophen 500 MG tablet  Commonly known as:  TYLENOL     clindamycin 150 MG capsule  Commonly known as:  CLEOCIN     pseudoephedrine-acetaminophen 30-500 MG Tabs tablet  Commonly known as:  TYLENOL SINUS      TAKE these medications        levofloxacin 500 MG tablet  Commonly known as:  LEVAQUIN  Take 1 tablet (500 mg total) by mouth daily. Please take for 7 days     saccharomyces boulardii 250 MG capsule  Commonly known as:  FLORASTOR  Take 1 capsule (250 mg total) by mouth 2 (two) times daily.          Diet and Activity recommendation: See Discharge Instructions above   Consults obtained - None   Major procedures and Radiology Reports - PLEASE review detailed and final reports for all details, in brief -      Dg Chest 2 View  08/11/2015  CLINICAL DATA:  Cough EXAM: CHEST  2 VIEW COMPARISON:  10/14/2014 chest CT FINDINGS: Normal heart size and mediastinal contours. No acute infiltrate or edema. No effusion or pneumothorax. Bulky endplate spurring. Cholecystectomy clips. IMPRESSION: No active cardiopulmonary disease. Electronically Signed   By: Monte Fantasia M.D.   On: 08/11/2015 19:12   Ct Abdomen Pelvis W Contrast  08/08/2015  CLINICAL DATA:  70 year old female with right lower quadrant abdominal pain EXAM: CT ABDOMEN AND PELVIS WITH CONTRAST TECHNIQUE: Multidetector CT imaging of the abdomen and pelvis was performed using the standard protocol following bolus administration of intravenous contrast. CONTRAST:  190mL OMNIPAQUE IOHEXOL 300 MG/ML  SOLN COMPARISON:  CT dated 10/14/2014 FINDINGS: Minimal bibasilar atelectasis/ scarring. The visualized lung bases are clear. No intra-abdominal free air. Trace  free fluid may be present within the pelvis. Cholecystectomy. Small stable appearing probable cyst in the left  lobe of the liver. Stable focus of enhancement in the dome of the liver is not characterized on this noncontrast study. There is mild dilatation of the CBD, likely post cholecystectomy. The pancreas appears unremarkable. Small stable splenic hypodensity is not characterized but may represent a cyst or hemangioma. The adrenal glands appear unremarkable. Stable subcentimeter right renal hypodensity, likely a cyst. Smaller right renal inferior pole hypodense lesion is too small to characterize. There is no hydronephrosis on either side. There is duplicated appearance of the left renal collecting system. There is mild heterogeneous enhancement of the kidneys concerning for pyelonephritis. Correlation with urinalysis recommended. There is haziness of the urothelium of the renal pelvis as well as diffuse thickening of the wall of the bladder most compatible with urinary tract infection. The small calcified uterine fibroid noted. There is extensive sigmoid diverticulosis with muscular hypertrophy. No active inflammatory changes. There postoperative changes of partial colon resection with anastomotic suture in the distal transverse colon. No evidence of bowel obstruction. There is a 3 cm duodenal diverticulum. Normal appendix. There is atherosclerotic calcification of the abdominal aorta. No portal venous gas identified. There is no adenopathy. There is a small fat containing umbilical hernia. The abdominal wall soft tissues are otherwise unremarkable. There is degenerative changes of the spine. No acute fracture. IMPRESSION: Findings most compatible with urinary tract infection and pyelonephritis. Correlation with urinalysis and cultures recommended. No abscess. Extensive colonic diverticulosis. No evidence of bowel obstruction or inflammation. Normal appendix. Nonacute findings as described above. Electronically  Signed   By: Anner Crete M.D.   On: 08/08/2015 22:29    Micro Results    Recent Results (from the past 240 hour(s))  Urine culture     Status: None   Collection Time: 08/08/15  2:32 PM  Result Value Ref Range Status   Specimen Description URINE, RANDOM  Final   Special Requests NONE  Final   Culture   Final    >=100,000 COLONIES/mL ESCHERICHIA COLI Performed at [facility]    Report Status 08/11/2015 FINAL  Final   Organism ID, Bacteria ESCHERICHIA COLI  Final      Susceptibility   Escherichia coli - MIC*    AMPICILLIN <=2 SENSITIVE Sensitive     CEFAZOLIN <=4 SENSITIVE Sensitive     CEFTRIAXONE <=1 SENSITIVE Sensitive     CIPROFLOXACIN <=0.25 SENSITIVE Sensitive     GENTAMICIN <=1 SENSITIVE Sensitive     IMIPENEM <=0.25 SENSITIVE Sensitive     NITROFURANTOIN <=16 SENSITIVE Sensitive     TRIMETH/SULFA <=20 SENSITIVE Sensitive     AMPICILLIN/SULBACTAM <=2 SENSITIVE Sensitive     PIP/TAZO <=4 SENSITIVE Sensitive     * >=100,000 COLONIES/mL ESCHERICHIA COLI  Culture, blood (x 2)     Status: None (Preliminary result)   Collection Time: 08/09/15  1:48 AM  Result Value Ref Range Status   Specimen Description BLOOD LEFT FOREARM  Final   Special Requests BOTTLES DRAWN AEROBIC AND ANAEROBIC 4ML  Final   Culture   Final    NO GROWTH 2 DAYS Performed at [facility]    Report Status PENDING  Incomplete  Culture, blood (x 2)     Status: None (Preliminary result)   Collection Time: 08/09/15  2:30 AM  Result Value Ref Range Status   Specimen Description BLOOD RIGHT ANTECUBITAL  Final   Special Requests IN PEDIATRIC BOTTLE 2ML  Final   Culture   Final    NO GROWTH 2 DAYS Performed at [facility]  Report Status PENDING  Incomplete       Today   Subjective:   Gladene Isham today has no headache,no chest abdominal pain,no new weakness tingling or numbness, feels much better wants to go home today.   Objective:   Blood pressure 157/71, pulse  63, temperature 98 F (36.7 C), temperature source Oral, resp. rate 18, height 5\' 6"  (1.676 m), weight 76.9 kg (169 lb 8.5 oz), SpO2 98 %.   Intake/Output Summary (Last 24 hours) at 08/12/15 1201 Last data filed at 08/12/15 0554  Gross per 24 hour  Intake   1080 ml  Output      0 ml  Net   1080 ml    Exam Awake Alert, Oriented x 3, No new F.N deficits, Normal affect [place].AT,PERRAL Supple Neck,No JVD, No cervical lymphadenopathy appriciated.  Symmetrical Chest wall movement, Good air movement bilaterally, CTAB RRR,No Gallops,Rubs or new Murmurs, No Parasternal Heave +ve B.Sounds, Abd Soft, right CVA tenderness resolved, No organomegaly appriciated, No rebound -guarding or rigidity. No Cyanosis, Clubbing or edema, No new Rash or bruise  Data Review   CBC w Diff: Lab Results  Component Value Date   WBC 4.2 08/10/2015   WBC 6.6 10/12/2013   HGB 11.2* 08/10/2015   HGB 13.3 10/12/2013   HCT 35.2* 08/10/2015   HCT 40.0 10/12/2013   PLT 189 08/10/2015   PLT 254 10/12/2013   LYMPHOPCT 51 08/09/2015   LYMPHOPCT 47.2 10/12/2013   MONOPCT 10 08/09/2015   MONOPCT 7.2 10/12/2013   EOSPCT 0 08/09/2015   EOSPCT 4.6 10/12/2013   BASOPCT 0 08/09/2015   BASOPCT 0.4 10/12/2013    CMP: Lab Results  Component Value Date   NA 137 08/10/2015   NA 144 10/14/2014   K 4.4 08/10/2015   K 3.4* 10/14/2014   CL 107 08/10/2015   CL 110* 09/19/2012   CO2 23 08/10/2015   CO2 26 10/14/2014   BUN 9 08/10/2015   BUN 7.0 10/14/2014   CREATININE 0.50 08/10/2015   CREATININE 0.7 10/14/2014   PROT 7.7 08/08/2015   PROT 7.5 10/12/2013   ALBUMIN 4.2 08/08/2015   ALBUMIN 3.9 10/12/2013   BILITOT 0.5 08/08/2015   BILITOT 0.53 10/12/2013   ALKPHOS 86 08/08/2015   ALKPHOS 112 10/12/2013   AST 31 08/08/2015   AST 19 10/12/2013   ALT 20 08/08/2015   ALT 8 10/12/2013  .   Total Time in preparing paper work, data evaluation and todays exam - 35 minutes  Forest Pruden M.D on 08/12/2015 at  12:01 PM  [facility]   Office  [phone]

## 2015-08-12 NOTE — Discharge Instructions (Signed)
Follow with Primary MDin 7 days  ? ?Get CBC, CMP,  checked  by Primary MD next visit.  ? ? ?Activity: As tolerated with Full fall precautions use walker/cane & assistance as needed ? ?Disposition Home ? ? ?Diet: Heart Healthy  , with feeding assistance and aspiration precautions. ? ?For Heart failure patients - Check your Weight same time everyday, if you gain over 2 pounds, or you develop in leg swelling, experience more shortness of breath or chest pain, call your Primary MD immediately. Follow Cardiac Low Salt Diet and 1.5 lit/day fluid restriction. ? ? ?On your next visit with your primary care physician please Get Medicines reviewed and adjusted. ? ? ?Please request your Prim.MD to go over all Hospital Tests and Procedure/Radiological results at the follow up, please get all Hospital records sent to your Prim MD by signing hospital release before you go home. ? ? ?If you experience worsening of your admission symptoms, develop shortness of breath, life threatening emergency, suicidal or homicidal thoughts you must seek medical attention immediately by calling 911 or calling your MD immediately  if symptoms less severe. ? ?You Must read complete instructions/literature along with all the possible adverse reactions/side effects for all the Medicines you take and that have been prescribed to you. Take any new Medicines after you have completely understood and accpet all the possible adverse reactions/side effects.  ? ?Do not drive, operating heavy machinery, perform activities at heights, swimming or participation in water activities or provide baby sitting services if your were admitted for syncope or siezures until you have seen by Primary MD or a Neurologist and advised to do so again. ? ?Do not drive when taking Pain medications.  ? ? ?Do not take more than prescribed Pain, Sleep and Anxiety Medications ? ?Special Instructions: If you have smoked or chewed Tobacco  in the last 2 yrs please stop smoking, stop  any regular Alcohol  and or any Recreational drug use. ? ?Wear Seat belts while driving. ? ? ?Please note ? ?You were cared for by a hospitalist during your hospital stay. If you have any questions about your discharge medications or the care you received while you were in the hospital after you are discharged, you can call the unit and asked to speak with the hospitalist on call if the hospitalist that took care of you is not available. Once you are discharged, your primary care physician will handle any further medical issues. Please note that NO REFILLS for any discharge medications will be authorized once you are discharged, as it is imperative that you return to your primary care physician (or establish a relationship with a primary care physician if you do not have one) for your aftercare needs so that they can reassess your need for medications and monitor your lab values.  ? ?

## 2015-08-14 LAB — CULTURE, BLOOD (ROUTINE X 2)
Culture: NO GROWTH
Culture: NO GROWTH

## 2015-10-13 ENCOUNTER — Ambulatory Visit (HOSPITAL_BASED_OUTPATIENT_CLINIC_OR_DEPARTMENT_OTHER): Payer: Medicare Other | Admitting: Oncology

## 2015-10-13 ENCOUNTER — Other Ambulatory Visit (HOSPITAL_BASED_OUTPATIENT_CLINIC_OR_DEPARTMENT_OTHER): Payer: Medicare Other

## 2015-10-13 ENCOUNTER — Telehealth: Payer: Self-pay | Admitting: Oncology

## 2015-10-13 VITALS — BP 144/76 | HR 75 | Temp 98.2°F | Resp 18 | Ht 66.0 in | Wt 167.3 lb

## 2015-10-13 DIAGNOSIS — C184 Malignant neoplasm of transverse colon: Secondary | ICD-10-CM

## 2015-10-13 DIAGNOSIS — R911 Solitary pulmonary nodule: Secondary | ICD-10-CM | POA: Diagnosis not present

## 2015-10-13 DIAGNOSIS — Z86718 Personal history of other venous thrombosis and embolism: Secondary | ICD-10-CM

## 2015-10-13 DIAGNOSIS — Z85038 Personal history of other malignant neoplasm of large intestine: Secondary | ICD-10-CM

## 2015-10-13 DIAGNOSIS — C189 Malignant neoplasm of colon, unspecified: Secondary | ICD-10-CM | POA: Diagnosis not present

## 2015-10-13 NOTE — Progress Notes (Signed)
  [facility] OFFICE PROGRESS NOTE   Diagnosis: Colon cancer  INTERVAL HISTORY:   [name] returns as scheduled. She was admitted in February with pyelonephritis. She now feels well. She has occasional abdominal pain at the surgical site. No other complaint.  Objective:  Vital signs in last 24 hours:  Blood pressure 144/76, pulse 75, temperature 98.2 F (36.8 C), temperature source Oral, resp. rate 18, height 5\' 6"  (1.676 m), weight 167 lb 4.8 oz (75.887 kg), SpO2 100 %.    HEENT: Neck without mass Lymphatics: No cervical, supra-clavicular, axillary, or inguinal nodes Resp: Lungs clear bilaterally Cardio: Regular rate and rhythm GI: No hepatosplenomegaly, no mass, nontender Vascular: No leg edema   Lab Results:  Lab Results  Component Value Date   WBC 4.2 08/10/2015   HGB 11.2* 08/10/2015   HCT 35.2* 08/10/2015   MCV 92.4 08/10/2015   PLT 189 08/10/2015   NEUTROABS 1.8 08/09/2015     Medications: I have reviewed the patient's current medications.  Assessment/Plan: 1. 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 Adjuvant FOLFOX chemotherapy was initiated on 01/21/2012. The last cycle was completed on 06/30/2012. Restaging CT scans 09/30/2012 showed no evidence of recurrent/metastatic disease. -Surveillance colonoscopy 02/24/2013 with a single hyperplastic polyp. Next colonoscopy recommended at a 3 year interval. -Surveillance CT scans 10/12/2013 with no evidence of metastatic disease  -Surveillance CT scans 10/14/2014 with no evidence of metastatic disease 2. Delayed nausea following cycle 2 FOLFOX. Aloxi was added with cycle 3. Persistent delayed nausea. Emend was added with cycle 4. She noted improvement in the nausea following cycle 4. She declined steroid prophylaxis.  3. Tiny lung nodules seen on a CT of the chest 10/19/2011. Stable on CT of the chest 10/12/2013 and  10/14/2014 4. History of tobacco use 5. Endometrial thickening on CT of the abdomen 10/02/2011. Progressive endometrial thickening on CT 09/30/2012. Status post removal of an endometrial polyp 02/03/2013 with benign pathology. 6. Port-A-Cath placement 01/09/2012 in interventional radiology. Removed 12/30/2012. 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 The finger numbness has improved.  8. Mild thrombocytopenia secondary to chemotherapy-resolved. 9. History of neutropenia secondary to chemotherapy. 10. 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     Disposition:  She remains in clinical remission from colon cancer. She will return for an office visit and CEA in 6 months. She is due for a colonoscopy in September of this year.  Betsy Coder, MD  10/13/2015  9:55 AM

## 2015-10-13 NOTE — Telephone Encounter (Signed)
Gave and printed appt sched and avs for pt OCT

## 2015-10-14 ENCOUNTER — Telehealth: Payer: Self-pay

## 2015-10-14 LAB — CEA (PARALLEL TESTING): CEA: 0.8 ng/mL

## 2015-10-14 LAB — CEA: CEA: 2.2 ng/mL (ref 0.0–4.7)

## 2015-10-14 NOTE — Telephone Encounter (Signed)
-----   Message from Owens Shark, NP sent at 10/14/2015  1:34 PM EDT ----- Please let her know CEA is normal.

## 2015-10-14 NOTE — Telephone Encounter (Signed)
Called and informed patient of normal CEA results. Pt verbalized understanding and appreciative of call. Pt informed to call with any questions or concerns.

## 2015-12-16 ENCOUNTER — Encounter (HOSPITAL_COMMUNITY): Payer: Self-pay | Admitting: *Deleted

## 2015-12-16 ENCOUNTER — Emergency Department (HOSPITAL_COMMUNITY): Payer: Medicare Other

## 2015-12-16 ENCOUNTER — Emergency Department (HOSPITAL_COMMUNITY)
Admission: EM | Admit: 2015-12-16 | Discharge: 2015-12-17 | Disposition: A | Discharge status: Home / Self Care | Payer: Medicare Other | Attending: Emergency Medicine | Admitting: Emergency Medicine

## 2015-12-16 ENCOUNTER — Other Ambulatory Visit: Payer: Self-pay

## 2015-12-16 DIAGNOSIS — Z86718 Personal history of other venous thrombosis and embolism: Secondary | ICD-10-CM | POA: Insufficient documentation

## 2015-12-16 DIAGNOSIS — Z85038 Personal history of other malignant neoplasm of large intestine: Secondary | ICD-10-CM | POA: Insufficient documentation

## 2015-12-16 DIAGNOSIS — F17210 Nicotine dependence, cigarettes, uncomplicated: Secondary | ICD-10-CM | POA: Insufficient documentation

## 2015-12-16 DIAGNOSIS — R531 Weakness: Secondary | ICD-10-CM | POA: Diagnosis present

## 2015-12-16 DIAGNOSIS — K5732 Diverticulitis of large intestine without perforation or abscess without bleeding: Secondary | ICD-10-CM | POA: Insufficient documentation

## 2015-12-16 DIAGNOSIS — Z79899 Other long term (current) drug therapy: Secondary | ICD-10-CM | POA: Diagnosis not present

## 2015-12-16 DIAGNOSIS — K5900 Constipation, unspecified: Secondary | ICD-10-CM | POA: Diagnosis not present

## 2015-12-16 DIAGNOSIS — K2970 Gastritis, unspecified, without bleeding: Secondary | ICD-10-CM | POA: Diagnosis not present

## 2015-12-16 LAB — COMPREHENSIVE METABOLIC PANEL
ALBUMIN: 4.5 g/dL (ref 3.5–5.0)
ALK PHOS: 84 U/L (ref 38–126)
ALT: 12 U/L — ABNORMAL LOW (ref 14–54)
AST: 18 U/L (ref 15–41)
Anion gap: 6 (ref 5–15)
BILIRUBIN TOTAL: 0.7 mg/dL (ref 0.3–1.2)
BUN: 9 mg/dL (ref 6–20)
CO2: 30 mmol/L (ref 22–32)
Calcium: 9.6 mg/dL (ref 8.9–10.3)
Chloride: 104 mmol/L (ref 101–111)
Creatinine, Ser: 0.64 mg/dL (ref 0.44–1.00)
GFR calc Af Amer: >60 mL/min (ref >60)
GFR calc non Af Amer: >60 mL/min (ref >60)
GLUCOSE: 90 mg/dL (ref 65–99)
POTASSIUM: 3.3 mmol/L — AB (ref 3.5–5.1)
Sodium: 140 mmol/L (ref 135–145)
TOTAL PROTEIN: 8.1 g/dL (ref 6.5–8.1)

## 2015-12-16 LAB — CBC WITH DIFFERENTIAL/PLATELET
BASOS ABS: 0 10*3/uL (ref 0.0–0.1)
Basophils Relative: 0 %
EOS PCT: 3 %
Eosinophils Absolute: 0.3 10*3/uL (ref 0.0–0.7)
HCT: 39.4 % (ref 36.0–46.0)
Hemoglobin: 13.1 g/dL (ref 12.0–15.0)
LYMPHS ABS: 5.7 10*3/uL — AB (ref 0.7–4.0)
LYMPHS PCT: 55 %
MCH: 30.1 pg (ref 26.0–34.0)
MCHC: 33.2 g/dL (ref 30.0–36.0)
MCV: 90.6 fL (ref 78.0–100.0)
MONO ABS: 0.8 10*3/uL (ref 0.1–1.0)
MONOS PCT: 7 %
Neutro Abs: 3.7 10*3/uL (ref 1.7–7.7)
Neutrophils Relative %: 35 %
PLATELETS: 268 10*3/uL (ref 150–400)
RBC: 4.35 MIL/uL (ref 3.87–5.11)
RDW: 13.5 % (ref 11.5–15.5)
WBC: 10.4 10*3/uL (ref 4.0–10.5)

## 2015-12-16 LAB — CBG MONITORING, ED: GLUCOSE-CAPILLARY: 95 mg/dL (ref 65–99)

## 2015-12-16 LAB — URINALYSIS, ROUTINE W REFLEX MICROSCOPIC
Bilirubin Urine: NEGATIVE
GLUCOSE, UA: NEGATIVE mg/dL
Ketones, ur: NEGATIVE mg/dL
NITRITE: NEGATIVE
PH: 6 (ref 5.0–8.0)
PROTEIN: NEGATIVE mg/dL
Specific Gravity, Urine: 1.004 — ABNORMAL LOW (ref 1.005–1.030)

## 2015-12-16 LAB — URINE MICROSCOPIC-ADD ON

## 2015-12-16 LAB — TROPONIN I: Troponin I: <0.03 ng/mL (ref <0.031)

## 2015-12-16 MED ORDER — SODIUM CHLORIDE 0.9 % IV BOLUS (SEPSIS)
1000.0000 mL | Freq: Once | INTRAVENOUS | Status: AC
  Administered 2015-12-16: 1000 mL via INTRAVENOUS

## 2015-12-16 MED ORDER — DIATRIZOATE MEGLUMINE & SODIUM 66-10 % PO SOLN: 15.0000 mL | Freq: Once | ORAL | Status: DC

## 2015-12-16 MED ORDER — IOPAMIDOL (ISOVUE-300) INJECTION 61%
100.0000 mL | Freq: Once | INTRAVENOUS | Status: AC | PRN
  Administered 2015-12-16: 100 mL via INTRAVENOUS

## 2015-12-16 NOTE — ED Provider Notes (Signed)
CSN: [id number]     Arrival date & time 12/16/15  1919 History   First MD Initiated Contact with Patient 12/16/15 2020     Chief Complaint  Patient presents with  . Generalized Body Aches  . Weakness     (Consider location/radiation/quality/duration/timing/severity/associated sxs/prior Treatment) The history is provided by the patient.     Patient is a 70 year old female history of colon cancer, status post partial colectomy in 2013, in remission, history of DVT in 2014 anticoagulation discontinued in July 2014, she presents to the emergency room with complaint of generalized body aches and generalized weakness that began today around noon.  She states that she had a normal morning and was sitting down watching TV when she ate lunch and did have epigastric abdominal pain after eating which radiated to her back.  Pain is rated moderate She did not have any vomiting or nausea. She describes her weakness as "pooped out" and body aches "like a tractor trailer ran over" her.  She has achiness in both of her legs which is new today and has occurred when she walks. She states that she can walk without any profound weakness, limp, exertional chest pain or dyspnea however because of the achiness in her legs she does stop.  She denies any fever, chills, sweats. She states having normal bowel movements, last BM was this morning was normal formed stool without melena or hematochezia.  She denies shortness of breath, she denies chest pain, lightheadedness, headaches.  No known sick contacts.     Past Medical History  Diagnosis Date  . colon cancer   . Abdominal pain   . Cancer ([place]) 11/2011    cancer in colon  . DVT (deep venous thrombosis) ([place]) 05/2012    (R)IJ and subclavian, stopped Coumadin July 2014  . History of kidney stones   . Colon cancer ([place]) 12/18/2011   Past Surgical History  Procedure Laterality Date  . Colon surgery    . Cholecystectomy    . Goiter removal     . Kidney stone  surgery    . Tonsillectomy      as child  . Colon resection  12/18/2011    Procedure: COLON RESECTION LAPAROSCOPIC;  Surgeon: Joyice Faster. Cornett, MD;  Location: [facility];  Service: General;  Laterality: N/A;  . Portacath placement  6/13  . Portacath removal  12/30/12  . Dilatation & currettage/hysteroscopy with resectocope N/A 02/03/2013    Procedure: DILATATION & CURETTAGE/HYSTEROSCOPY, removal  of endometrial mass;  Surgeon: Sharene Butters, MD;  Location: [facility];  Service: Gynecology;  Laterality: N/A;   Family History  Problem Relation Age of Onset  . Breast cancer Mother   . Heart disease Mother   . Hypertension Other   . Colon polyps Neg Hx   . Esophageal cancer Neg Hx   . Stomach cancer Neg Hx   . Rectal cancer Neg Hx    Social History  Substance Use Topics  . Smoking status: Current Some Day Smoker -- 0.25 packs/day for 35 years    Types: Cigarettes    Last Attempt to Quit: 11/07/2012  . Smokeless tobacco: Never Used  . Alcohol Use: No   OB History    Gravida Para Term Preterm AB TAB SAB Ectopic Multiple Living   0 0 0 0 0 0 0 0       Review of Systems  All other systems reviewed and are negative.     Allergies  Aspirin; Advil; Aleve; and Penicillins  Home Medications   Prior to Admission medications   Medication Sig Start Date End Date Taking? Authorizing Provider  acetaminophen (TYLENOL) 500 MG tablet Take 1,000 mg by mouth every 6 (six) hours as needed for headache.   Yes Historical Provider, MD  HYDROcodone-acetaminophen (NORCO/VICODIN) 5-325 MG tablet Take 1 tablet by mouth every 8 (eight) hours as needed for severe pain. 12/17/15   Delsa Grana, PA-C  levofloxacin (LEVAQUIN) 750 MG tablet Take 1 tablet (750 mg total) by mouth daily. 12/17/15   Delsa Grana, PA-C  metroNIDAZOLE (FLAGYL) 500 MG tablet Take 1 tablet (500 mg total) by mouth 2 (two) times daily. 12/17/15   Delsa Grana, PA-C  ondansetron (ZOFRAN ODT) 4 MG disintegrating tablet Take 1 tablet (4 mg total)  by mouth every 8 (eight) hours as needed for nausea or vomiting. 12/17/15   Delsa Grana, PA-C  pantoprazole (PROTONIX) 20 MG tablet Take 1 tablet (20 mg total) by mouth daily. 12/17/15   Delsa Grana, PA-C  polyethylene glycol powder (GLYCOLAX/MIRALAX) powder Take 17 g by mouth daily. 12/17/15   Delsa Grana, PA-C   BP 143/72 mmHg  Pulse 58  Temp(Src) 98.1 F (36.7 C) (Oral)  Resp 18  Ht 5\' 5"  (1.651 m)  Wt 74.844 kg  BMI 27.46 kg/m2  SpO2 97% Physical Exam  Constitutional: She is oriented to person, place, and time. She appears well-developed and well-nourished. No distress.  HENT:  Head: Normocephalic and atraumatic.  Nose: Nose normal.  Mouth/Throat: Oropharynx is clear and moist. No oropharyngeal exudate.  Eyes: Conjunctivae and EOM are normal. Pupils are equal, round, and reactive to light. Right eye exhibits no discharge. Left eye exhibits no discharge. No scleral icterus.  Neck: Normal range of motion. No JVD present. No tracheal deviation present. No thyromegaly present.  Cardiovascular: Normal rate, regular rhythm, normal heart sounds and intact distal pulses.  Exam reveals no gallop and no friction rub.   No murmur heard. Pulses:      Radial pulses are 2+ on the right side, and 2+ on the left side.       Femoral pulses are 2+ on the right side, and 2+ on the left side.      Posterior tibial pulses are 2+ on the right side, and 2+ on the left side.  No lower extremity edema  Pulmonary/Chest: Effort normal and breath sounds normal. No respiratory distress. She has no wheezes. She has no rales. She exhibits no tenderness.  Abdominal: Soft. Bowel sounds are normal. She exhibits no distension and no mass. There is tenderness. There is guarding. There is no rebound.  Generalized tenderness to palpation with voluntary guarding, no CVA tenderness  Musculoskeletal: Normal range of motion. She exhibits no edema or tenderness.  Lymphadenopathy:    She has no cervical adenopathy.   Neurological: She is alert and oriented to person, place, and time. She has normal reflexes. No cranial nerve deficit. She exhibits normal muscle tone. Coordination normal.  Speech is clear and goal oriented, follows commands Major Cranial nerves without deficit, no facial droop Normal strength in upper and lower extremities bilaterally including dorsiflexion and plantar flexion, strong and equal grip strength Sensation normal to light and sharp touch Moves extremities without ataxia, coordination intact Normal finger to nose and rapid alternating movements Neg romberg, no pronator drift Normal gait and balance   Skin: Skin is warm and dry. No rash noted. She is not diaphoretic. No erythema. No pallor.  Psychiatric: She has a normal mood and affect. Her  behavior is normal. Judgment and thought content normal.  Nursing note and vitals reviewed.   ED Course  Procedures (including critical care time) Labs Review Labs Reviewed  COMPREHENSIVE METABOLIC PANEL - Abnormal; Notable for the following:    Potassium 3.3 (*)    ALT 12 (*)    All other components within normal limits  CBC WITH DIFFERENTIAL/PLATELET - Abnormal; Notable for the following:    Lymphs Abs 5.7 (*)    All other components within normal limits  URINALYSIS, ROUTINE W REFLEX MICROSCOPIC (NOT AT [facility]) - Abnormal; Notable for the following:    Specific Gravity, Urine 1.004 (*)    Hgb urine dipstick TRACE (*)    Leukocytes, UA SMALL (*)    All other components within normal limits  URINE MICROSCOPIC-ADD ON - Abnormal; Notable for the following:    Squamous Epithelial / LPF 0-5 (*)    Bacteria, UA RARE (*)    All other components within normal limits  URINE CULTURE  TROPONIN I  CBG MONITORING, ED    Imaging Review Dg Chest 2 View  12/16/2015  CLINICAL DATA:  Weakness, epigastric pain radiating to back, "it feels like a tractor trailer ran over me", generalized body aches and weakness beginning at 1200 hours today,  history colon cancer, smoker EXAM: CHEST  2 VIEW COMPARISON:  08/11/2015 FINDINGS: Upper normal heart size. Mediastinal contours and pulmonary vascularity normal. Lungs clear. No pleural effusion or pneumothorax. Endplate spur formation lower thoracic spine. IMPRESSION: No acute abnormalities. Electronically Signed   By: Lavonia Dana M.D.   On: 12/16/2015 21:18   Ct Abdomen Pelvis W Contrast  12/16/2015  CLINICAL DATA:  Diffuse abdominal pain, epigastric pain, weakness, history of colon cancer, status post colectomy EXAM: CT ABDOMEN AND PELVIS WITH CONTRAST TECHNIQUE: Multidetector CT imaging of the abdomen and pelvis was performed using the standard protocol following bolus administration of intravenous contrast. CONTRAST:  156mL ISOVUE-300 IOPAMIDOL (ISOVUE-300) INJECTION 61% COMPARISON:  08/08/2015 FINDINGS: Lower chest:  The lung bases are unremarkable.  Small hiatal hernia. Hepatobiliary: State stable focal enhancing lesion in the dome of the liver measures 1.9 cm stable in size in appearance from prior exam. A cyst in left hepatic lobe is stable. Status postcholecystectomy. Pancreas: Enhanced pancreas is unremarkable. Spleen: Stable hypodense cyst or hemangioma within spleen. Adrenals/Urinary Tract: No adrenal gland mass. Kidneys are symmetrical in size and enhancement. No hydronephrosis or hydroureter. Delayed renal images shows bilateral renal symmetrical excretion. Bilateral visualized proximal ureter is unremarkable. Moderate distended urinary bladder. No bladder filling defects are noted. Stomach/Bowel: No gastric outlet obstruction. No small bowel obstruction. Again noted postsurgical changes within distal transverse colon. Abundant stool noted in transverse colon. Moderate stool noted in descending colon and cecum. No pericecal inflammation. Normal appendix is noted in axial image 50. Scattered diverticula are noted descending colon. Multiple sigmoid colon diverticula. In axial image 59 there is mild  thickening of diverticular wall in proximal sigmoid colon and mild stranding of surrounding fat. This is confirmed in coronal image 42. Findings are consistent with mild diverticulitis. There is no evidence of diverticular abscess. No extraluminal contrast material. Again noted muscular hypertrophy in distal sigmoid colon. Vascular/Lymphatic: Atherosclerotic calcifications of abdominal aorta and iliac arteries. No aortic aneurysm. No retroperitoneal or mesenteric adenopathy. Reproductive: Mild retroflexed uterus. A calcified fibroid within uterus measures about 1.2 cm. Other: Stable 3 cm duodenal diverticulum without evidence of acute complication. No ascites or free abdominal air. Again noted small umbilical hernia containing fat and partial small  bowel loop. There is no evidence of acute complication or small bowel obstruction. Musculoskeletal: No destructive bony lesions are noted. Degenerative changes lumbar spine. Mild degenerative changes pubic symphysis. IMPRESSION: 1. There is mild acute diverticulitis in proximal sigmoid colon left pelvis axial image 59 and 58. No evidence of diverticular abscess or perforation. 2. Normal appendix.  No pericecal inflammation. 3. Again noted postsurgical changes transverse colon. Abundant stool noted in transverse colon. Moderate stool noted in descending colon. 4. No small bowel obstruction. 5. No hydronephrosis or hydroureter. 6. Tiny umbilical hernia containing omental fat and partial small bowel loop without evidence of acute complication or small bowel obstruction. Electronically Signed   By: Lahoma Crocker M.D.   On: 12/16/2015 23:57   I have personally reviewed and evaluated these images and lab results as part of my medical decision-making.   EKG Interpretation None      MDM   Final diagnoses:  Generalized weakness  Diverticulitis of large intestine without perforation or abscess without bleeding  Constipation, unspecified constipation type  Gastritis     Pt with abdominal pain, generalized weakness and myalgias On exam pt had generalized abdominal tenderness, normal cardiopulmonary exam, normal neuro CT abd/pelvis pertinent for Mild acute diverticulitis in proximal sigmoid colon, also evidence of constipation, normal kidneys, normal appendix She had worsening pain while in the ER when having to drink oral contrast, this improved with GI cocktail and she was able to tolerate fluids w/o worsening pain.  She has had no vomiting. Suspect her generalized weakness and myalgias are secondary to onset of diverticulitis, no concern for neuro or cardiac etiology.  Basic labs obtained were unremarkable, troponin negative, chest x-ray negative, mild hyperkalemia, replaced with oral potassium in the ER. Urinalysis rare bacteria, trace blood and small leukocytes, sent for culture, no urinary symptoms. Patient educated regarding diagnosis of diverticulitis and management including bowel rest/clear fluids and given antibiotics.  Return precautions reviewed. She was encouraged follow-up with her PCP.  She was hemodynamically stable in the ER, afebrile and generally well appearing, feel she is safe to discharge home with clear treatment plan, and has no emergent medical condition necessitating further workup or admission. Case was discussed with Dr. Alvino Chapel who agrees to plan to discharge home.   Delsa Grana, PA-C 12/17/15 2103  Davonna Belling, MD 12/19/15 [phone]

## 2015-12-16 NOTE — ED Notes (Signed)
Pt states "it feels like a tractor trailer ran over me", pt states she began feeling generalized body aches and weakness all over at 12PM today. Pt has hx of colon cancer, which has been in remission since Jan 2015. Pt denies nausea, vomiting, diarrhea, abdominal pain at this time.

## 2015-12-16 NOTE — ED Notes (Signed)
RN will draw labs with IV start.

## 2015-12-16 NOTE — ED Notes (Signed)
Patient transported to CT 

## 2015-12-17 MED ORDER — POLYETHYLENE GLYCOL 3350 17 GM/SCOOP PO POWD: 17.0000 g | Freq: Every day | ORAL | Status: DC

## 2015-12-17 MED ORDER — METRONIDAZOLE 500 MG PO TABS: 500.0000 mg | ORAL_TABLET | Freq: Two times a day (BID) | ORAL | Status: DC

## 2015-12-17 MED ORDER — POTASSIUM CHLORIDE CRYS ER 20 MEQ PO TBCR
20.0000 meq | EXTENDED_RELEASE_TABLET | Freq: Once | ORAL | Status: AC
  Administered 2015-12-17: 20 meq via ORAL
  Filled 2015-12-17: qty 1

## 2015-12-17 MED ORDER — HYDROCODONE-ACETAMINOPHEN 5-325 MG PO TABS: 1.0000 | ORAL_TABLET | Freq: Three times a day (TID) | ORAL | Status: DC | PRN

## 2015-12-17 MED ORDER — ONDANSETRON 4 MG PO TBDP: 4.0000 mg | ORAL_TABLET | Freq: Three times a day (TID) | ORAL | Status: DC | PRN

## 2015-12-17 MED ORDER — LEVOFLOXACIN 750 MG PO TABS: 750.0000 mg | ORAL_TABLET | Freq: Every day | ORAL | Status: DC

## 2015-12-17 MED ORDER — GI COCKTAIL ~~LOC~~
30.0000 mL | Freq: Once | ORAL | Status: AC
Start: 2015-12-17 — End: 2015-12-17
  Administered 2015-12-17: 30 mL via ORAL
  Filled 2015-12-17: qty 30

## 2015-12-17 MED ORDER — PANTOPRAZOLE SODIUM 20 MG PO TBEC: 20.0000 mg | DELAYED_RELEASE_TABLET | Freq: Every day | ORAL | Status: DC

## 2015-12-17 NOTE — Discharge Instructions (Signed)
Diverticulitis Diverticulitis is inflammation or infection of small pouches in your colon that form when you have a condition called diverticulosis. The pouches in your colon are called diverticula. Your colon, or large intestine, is where water is absorbed and stool is formed. Complications of diverticulitis can include:  Bleeding.  Severe infection.  Severe pain.  Perforation of your colon.  Obstruction of your colon. CAUSES  Diverticulitis is caused by bacteria. Diverticulitis happens when stool becomes trapped in diverticula. This allows bacteria to grow in the diverticula, which can lead to inflammation and infection. RISK FACTORS People with diverticulosis are at risk for diverticulitis. Eating a diet that does not include enough fiber from fruits and vegetables may make diverticulitis more likely to develop. SYMPTOMS  Symptoms of diverticulitis may include:  Abdominal pain and tenderness. The pain is normally located on the left side of the abdomen, but may occur in other areas.  Fever and chills.  Bloating.  Cramping.  Nausea.  Vomiting.  Constipation.  Diarrhea.  Blood in your stool. DIAGNOSIS  Your health care provider will ask you about your medical history and do a physical exam. You may need to have tests done because many medical conditions can cause the same symptoms as diverticulitis. Tests may include:  Blood tests.  Urine tests.  Imaging tests of the abdomen, including X-rays and CT scans. When your condition is under control, your health care provider may recommend that you have a colonoscopy. A colonoscopy can show how severe your diverticula are and whether something else is causing your symptoms. TREATMENT  Most cases of diverticulitis are mild and can be treated at home. Treatment may include:  Taking over-the-counter pain medicines.  Following a clear liquid diet.  Taking antibiotic medicines by mouth for 7-10 days. More severe cases may  be treated at a hospital. Treatment may include:  Not eating or drinking.  Taking prescription pain medicine.  Receiving antibiotic medicines through an IV tube.  Receiving fluids and nutrition through an IV tube.  Surgery. HOME CARE INSTRUCTIONS   Follow your health care provider's instructions carefully.  Follow a full liquid diet or other diet as directed by your health care provider. After your symptoms improve, your health care provider may tell you to change your diet. He or she may recommend you eat a high-fiber diet. Fruits and vegetables are good sources of fiber. Fiber makes it easier to pass stool.  Take fiber supplements or probiotics as directed by your health care provider.  Only take medicines as directed by your health care provider.  Keep all your follow-up appointments. SEEK MEDICAL CARE IF:   Your pain does not improve.  You have a hard time eating food.  Your bowel movements do not return to normal. SEEK IMMEDIATE MEDICAL CARE IF:   Your pain becomes worse.  Your symptoms do not get better.  Your symptoms suddenly get worse.  You have a fever.  You have repeated vomiting.  You have bloody or black, tarry stools. MAKE SURE YOU:   Understand these instructions.  Will watch your condition.  Will get help right away if you are not doing well or get worse.   This information is not intended to replace advice given to you by your health care provider. Make sure you discuss any questions you have with your health care provider.   Document Released: 03/21/2005 Document Revised: 06/16/2013 Document Reviewed: 05/06/2013 Elsevier Interactive Patient Education 2016 Elsevier Inc.  Gastritis, Adult Gastritis is soreness and swelling (inflammation)  of the lining of the stomach. Gastritis can develop as a sudden onset (acute) or long-term (chronic) condition. If gastritis is not treated, it can lead to stomach bleeding and ulcers. CAUSES  Gastritis  occurs when the stomach lining is weak or damaged. Digestive juices from the stomach then inflame the weakened stomach lining. The stomach lining may be weak or damaged due to viral or bacterial infections. One common bacterial infection is the Helicobacter pylori infection. Gastritis can also result from excessive alcohol consumption, taking certain medicines, or having too much acid in the stomach.  SYMPTOMS  In some cases, there are no symptoms. When symptoms are present, they may include:  Pain or a burning sensation in the upper abdomen.  Nausea.  Vomiting.  An uncomfortable feeling of fullness after eating. DIAGNOSIS  Your caregiver may suspect you have gastritis based on your symptoms and a physical exam. To determine the cause of your gastritis, your caregiver may perform the following:  Blood or stool tests to check for the H pylori bacterium.  Gastroscopy. A thin, flexible tube (endoscope) is passed down the esophagus and into the stomach. The endoscope has a light and camera on the end. Your caregiver uses the endoscope to view the inside of the stomach.  Taking a tissue sample (biopsy) from the stomach to examine under a microscope. TREATMENT  Depending on the cause of your gastritis, medicines may be prescribed. If you have a bacterial infection, such as an H pylori infection, antibiotics may be given. If your gastritis is caused by too much acid in the stomach, H2 blockers or antacids may be given. Your caregiver may recommend that you stop taking aspirin, ibuprofen, or other nonsteroidal anti-inflammatory drugs (NSAIDs). HOME CARE INSTRUCTIONS  Only take over-the-counter or prescription medicines as directed by your caregiver.  If you were given antibiotic medicines, take them as directed. Finish them even if you start to feel better.  Drink enough fluids to keep your urine clear or pale yellow.  Avoid foods and drinks that make your symptoms worse, such as:  Caffeine or  alcoholic drinks.  Chocolate.  Peppermint or mint flavorings.  Garlic and onions.  Spicy foods.  Citrus fruits, such as oranges, lemons, or limes.  Tomato-based foods such as sauce, chili, salsa, and pizza.  Fried and fatty foods.  Eat small, frequent meals instead of large meals. SEEK IMMEDIATE MEDICAL CARE IF:   You have black or dark red stools.  You vomit blood or material that looks like coffee grounds.  You are unable to keep fluids down.  Your abdominal pain gets worse.  You have a fever.  You do not feel better after 1 week.  You have any other questions or concerns. MAKE SURE YOU:  Understand these instructions.  Will watch your condition.  Will get help right away if you are not doing well or get worse.   This information is not intended to replace advice given to you by your health care provider. Make sure you discuss any questions you have with your health care provider.   Document Released: 06/05/2001 Document Revised: 12/11/2011 Document Reviewed: 07/25/2011 Elsevier Interactive Patient Education 2016 [employer].  Constipation, Adult Constipation is when a person has fewer than three bowel movements a week, has difficulty having a bowel movement, or has stools that are dry, hard, or larger than normal. As people grow older, constipation is more common. A low-fiber diet, not taking in enough fluids, and taking certain medicines may make constipation worse.  CAUSES   Certain medicines, such as antidepressants, pain medicine, iron supplements, antacids, and water pills.   Certain diseases, such as diabetes, irritable bowel syndrome (IBS), thyroid disease, or depression.   Not drinking enough water.   Not eating enough fiber-rich foods.   Stress or travel.   Lack of physical activity or exercise.   Ignoring the urge to have a bowel movement.   Using laxatives too much.  SIGNS AND SYMPTOMS   Having fewer than three bowel movements  a week.   Straining to have a bowel movement.   Having stools that are hard, dry, or larger than normal.   Feeling full or bloated.   Pain in the lower abdomen.   Not feeling relief after having a bowel movement.  DIAGNOSIS  Your health care provider will take a medical history and perform a physical exam. Further testing may be done for severe constipation. Some tests may include:  A barium enema X-ray to examine your rectum, colon, and, sometimes, your small intestine.   A sigmoidoscopy to examine your lower colon.   A colonoscopy to examine your entire colon. TREATMENT  Treatment will depend on the severity of your constipation and what is causing it. Some dietary treatments include drinking more fluids and eating more fiber-rich foods. Lifestyle treatments may include regular exercise. If these diet and lifestyle recommendations do not help, your health care provider may recommend taking over-the-counter laxative medicines to help you have bowel movements. Prescription medicines may be prescribed if over-the-counter medicines do not work.  HOME CARE INSTRUCTIONS   Eat foods that have a lot of fiber, such as fruits, vegetables, whole grains, and beans.  Limit foods high in fat and processed sugars, such as french fries, hamburgers, cookies, candies, and soda.   A fiber supplement may be added to your diet if you cannot get enough fiber from foods.   Drink enough fluids to keep your urine clear or pale yellow.   Exercise regularly or as directed by your health care provider.   Go to the restroom when you have the urge to go. Do not hold it.   Only take over-the-counter or prescription medicines as directed by your health care provider. Do not take other medicines for constipation without talking to your health care provider first.  [place] IF:   You have bright red blood in your stool.   Your constipation lasts for more than 4 days or gets  worse.   You have abdominal or rectal pain.   You have thin, pencil-like stools.   You have unexplained weight loss. MAKE SURE YOU:   Understand these instructions.  Will watch your condition.  Will get help right away if you are not doing well or get worse.   This information is not intended to replace advice given to you by your health care provider. Make sure you discuss any questions you have with your health care provider.   Document Released: 03/09/2004 Document Revised: 07/02/2014 Document Reviewed: 03/23/2013 Elsevier Interactive Patient Education [employer].

## 2015-12-18 ENCOUNTER — Telehealth: Payer: Self-pay | Admitting: *Deleted

## 2015-12-18 ENCOUNTER — Telehealth (HOSPITAL_BASED_OUTPATIENT_CLINIC_OR_DEPARTMENT_OTHER): Payer: Self-pay

## 2015-12-18 NOTE — Telephone Encounter (Signed)
CM was able to provide pt with GoodRx coupon which reduced the cost of her Levaquin to $19.91. Called Malia at [facility] and made her aware of above. Pt had already filled her Rx but Bettey Mare was going to notify the pt and have her come to the Pharmacy for refund if able and willing. No further CM needs at this time. Educated Pharmacy on our frequent use of GoodRx.

## 2015-12-19 LAB — URINE CULTURE
Culture: >=100000 — AB
Special Requests: NORMAL

## 2015-12-20 ENCOUNTER — Telehealth (HOSPITAL_BASED_OUTPATIENT_CLINIC_OR_DEPARTMENT_OTHER): Payer: Self-pay | Admitting: Emergency Medicine

## 2015-12-20 NOTE — Telephone Encounter (Signed)
Post ED Visit - Positive Culture Follow-up  Culture report reviewed by antimicrobial stewardship pharmacist:  []  Elenor Quinones, Pharm.D. []  Heide Guile, Pharm.D., BCPS []  Parks Neptune, Pharm.D. []  Alycia Rossetti, Pharm.D., BCPS []  Ray, Pharm.D., BCPS, AAHIVP []  Legrand Como, Pharm.D., BCPS, AAHIVP [x]  Milus Glazier, Pharm.D. []  Stephens November, Pharm.D.  Positive urine culture Treated with metronidazole, levofloxacin, organism sensitive to the same and no further patient follow-up is required at this time.  Hazle Nordmann 12/20/2015, 1:15 PM

## 2016-01-16 ENCOUNTER — Encounter: Payer: Self-pay | Admitting: Gastroenterology

## 2016-01-25 ENCOUNTER — Encounter: Payer: Self-pay | Admitting: Gastroenterology

## 2016-03-16 ENCOUNTER — Ambulatory Visit (AMBULATORY_SURGERY_CENTER): Payer: Self-pay

## 2016-03-16 VITALS — Ht 65.0 in | Wt 165.6 lb

## 2016-03-16 DIAGNOSIS — Z85038 Personal history of other malignant neoplasm of large intestine: Secondary | ICD-10-CM

## 2016-03-16 MED ORDER — NA SULFATE-K SULFATE-MG SULF 17.5-3.13-1.6 GM/177ML PO SOLN: ORAL | 0 refills | Status: DC

## 2016-03-16 NOTE — Progress Notes (Signed)
Per pt, no allergies to soy or egg products.Pt not taking any weight loss meds or using  O2 at home. 

## 2016-03-26 ENCOUNTER — Other Ambulatory Visit (HOSPITAL_BASED_OUTPATIENT_CLINIC_OR_DEPARTMENT_OTHER): Payer: Medicare Other

## 2016-03-26 ENCOUNTER — Telehealth: Payer: Self-pay | Admitting: Oncology

## 2016-03-26 ENCOUNTER — Ambulatory Visit (HOSPITAL_BASED_OUTPATIENT_CLINIC_OR_DEPARTMENT_OTHER): Payer: Medicare Other | Admitting: Nurse Practitioner

## 2016-03-26 VITALS — BP 136/68 | HR 65 | Temp 98.2°F | Resp 17 | Ht 65.0 in | Wt 165.2 lb

## 2016-03-26 DIAGNOSIS — Z86718 Personal history of other venous thrombosis and embolism: Secondary | ICD-10-CM | POA: Diagnosis not present

## 2016-03-26 DIAGNOSIS — Z23 Encounter for immunization: Secondary | ICD-10-CM

## 2016-03-26 DIAGNOSIS — C189 Malignant neoplasm of colon, unspecified: Secondary | ICD-10-CM | POA: Diagnosis not present

## 2016-03-26 DIAGNOSIS — C184 Malignant neoplasm of transverse colon: Secondary | ICD-10-CM

## 2016-03-26 LAB — CEA (IN HOUSE-CHCC): CEA (CHCC-IN HOUSE): 1.39 ng/mL (ref 0.00–5.00)

## 2016-03-26 MED ORDER — INFLUENZA VAC SPLIT QUAD 0.5 ML IM SUSY
0.5000 mL | PREFILLED_SYRINGE | Freq: Once | INTRAMUSCULAR | Status: AC
  Administered 2016-03-26: 0.5 mL via INTRAMUSCULAR
  Filled 2016-03-26: qty 0.5

## 2016-03-26 NOTE — Progress Notes (Signed)
  [facility] OFFICE PROGRESS NOTE   Diagnosis:  Colon cancer  INTERVAL HISTORY:   [name] returns as scheduled. She feels well. She remains very active. No change in bowel habits. No bloody or black stools. No nausea or vomiting. No abdominal pain. She has a good appetite.  Objective:  Vital signs in last 24 hours:  Blood pressure 136/68, pulse 65, temperature 98.2 F (36.8 C), temperature source Oral, resp. rate 17, height 5\' 5"  (1.651 m), weight 165 lb 3.2 oz (74.9 kg), SpO2 100 %.    HEENT: No neck mass. Lymphatics: No palpable cervical, supra clavicular, axillary or inguinal lymph nodes. Resp: Lungs clear bilaterally. Cardio: Regular rate and rhythm. GI: Abdomen soft and nontender. No hepatomegaly. Vascular: No leg edema.    Lab Results:  Lab Results  Component Value Date   WBC 10.4 12/16/2015   HGB 13.1 12/16/2015   HCT 39.4 12/16/2015   MCV 90.6 12/16/2015   PLT 268 12/16/2015   NEUTROABS 3.7 12/16/2015    Imaging:  No results found.  Medications: I have reviewed the patient's current medications.  Assessment/Plan: 1. 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 Adjuvant FOLFOX chemotherapy was initiated on 01/21/2012. The last cycle was completed on 06/30/2012. Restaging CT scans 09/30/2012 showed no evidence of recurrent/metastatic disease. -Surveillance colonoscopy 02/24/2013 with a single hyperplastic polyp. Next colonoscopy recommended at a 3 year interval. -Surveillance CT scans 10/12/2013 with no evidence of metastatic disease  -Surveillance CT scans 10/14/2014 with no evidence of metastatic disease 2. Delayed nausea following cycle 2 FOLFOX. Aloxi was added with cycle 3. Persistent delayed nausea. Emend was added with cycle 4. She noted improvement in the nausea following cycle 4. She declined steroid prophylaxis.  3. Tiny lung nodules seen on a CT of the  chest 10/19/2011. Stable on CT of the chest 10/12/2013 and 10/14/2014 4. History of tobacco use 5. Endometrial thickening on CT of the abdomen 10/02/2011. Progressive endometrial thickening on CT 09/30/2012. Status post removal of an endometrial polyp 02/03/2013 with benign pathology. 6. Port-A-Cath placement 01/09/2012 in interventional radiology. Removed 12/30/2012. 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 The finger numbness has improved.  8. Mild thrombocytopenia secondary to chemotherapy-resolved. 9. History of neutropenia secondary to chemotherapy. 10. 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   Disposition: Ms. [name] remains in clinical remission from colon cancer. She will return for a follow-up visit and CEA in 6 months.   She will receive the influenza vaccine today.  She is scheduled for a colonoscopy later this week.    Ned Card ANP/GNP-BC   03/26/2016  10:11 AM

## 2016-03-26 NOTE — Addendum Note (Signed)
Addended by: Clifton James D on: 03/26/2016 10:18 AM   Modules accepted: Orders

## 2016-03-26 NOTE — Telephone Encounter (Signed)
Gave patient avs report and appointments for April  °

## 2016-03-27 ENCOUNTER — Telehealth: Payer: Self-pay

## 2016-03-27 ENCOUNTER — Telehealth: Payer: Self-pay | Admitting: *Deleted

## 2016-03-27 LAB — CEA: CEA: 2.1 ng/mL (ref 0.0–4.7)

## 2016-03-27 NOTE — Telephone Encounter (Signed)
-----   Message from Ladell Pier, MD sent at 03/26/2016  7:00 PM EDT ----- Please call patient, cea is normal

## 2016-03-27 NOTE — Telephone Encounter (Signed)
Per Dr. Benay Spice, patient notified that cea is normal.  Patient appreciative of call and has no questions or concerns at this time.

## 2016-03-30 ENCOUNTER — Ambulatory Visit (AMBULATORY_SURGERY_CENTER): Payer: Medicare Other | Admitting: Gastroenterology

## 2016-03-30 ENCOUNTER — Encounter: Payer: Self-pay | Admitting: Gastroenterology

## 2016-03-30 VITALS — BP 150/87 | HR 57 | Temp 96.8°F | Resp 18 | Ht 65.0 in | Wt 165.0 lb

## 2016-03-30 DIAGNOSIS — Z85038 Personal history of other malignant neoplasm of large intestine: Secondary | ICD-10-CM

## 2016-03-30 DIAGNOSIS — K5730 Diverticulosis of large intestine without perforation or abscess without bleeding: Secondary | ICD-10-CM | POA: Diagnosis not present

## 2016-03-30 MED ORDER — SODIUM CHLORIDE 0.9 % IV SOLN: 500.0000 mL | INTRAVENOUS | Status: DC

## 2016-03-30 NOTE — Patient Instructions (Signed)
YOU HAD AN ENDOSCOPIC PROCEDURE TODAY AT [facility]:   Refer to the procedure report that was given to you for any specific questions about what was found during the examination.  If the procedure report does not answer your questions, please call your gastroenterologist to clarify.  If you requested that your care partner not be given the details of your procedure findings, then the procedure report has been included in a sealed envelope for you to review at your convenience later.  YOU SHOULD EXPECT: Some feelings of bloating in the abdomen. Passage of more gas than usual.  Walking can help get rid of the air that was put into your GI tract during the procedure and reduce the bloating. If you had a lower endoscopy (such as a colonoscopy or flexible sigmoidoscopy) you may notice spotting of blood in your stool or on the toilet paper. If you underwent a bowel prep for your procedure, you may not have a normal bowel movement for a few days.  Please Note:  You might notice some irritation and congestion in your nose or some drainage.  This is from the oxygen used during your procedure.  There is no need for concern and it should clear up in a day or so.  SYMPTOMS TO REPORT IMMEDIATELY:   Following lower endoscopy (colonoscopy or flexible sigmoidoscopy):  Excessive amounts of blood in the stool  Significant tenderness or worsening of abdominal pains  Swelling of the abdomen that is new, acute  Fever of 100F or higher    For urgent or emergent issues, a gastroenterologist can be reached at any hour by calling [phone].   DIET:  We do recommend a small meal at first, but then you may proceed to your regular diet.  Drink plenty of fluids but you should avoid alcoholic beverages for 24 hours.  ACTIVITY:  You should plan to take it easy for the rest of today and you should NOT DRIVE or use heavy machinery until tomorrow (because of the sedation medicines used during the test).     FOLLOW UP: Our staff will call the number listed on your records the next business day following your procedure to check on you and address any questions or concerns that you may have regarding the information given to you following your procedure. If we do not reach you, we will leave a message.  However, if you are feeling well and you are not experiencing any problems, there is no need to return our call.  We will assume that you have returned to your regular daily activities without incident.  If any biopsies were taken you will be contacted by phone or by letter within the next 1-3 weeks.  Please call us at [phone] if you have not heard about the biopsies in 3 weeks.    SIGNATURES/CONFIDENTIALITY: You and/or your care partner have signed paperwork which will be entered into your electronic medical record.  These signatures attest to the fact that that the information above on your After Visit Summary has been reviewed and is understood.  Full responsibility of the confidentiality of this discharge information lies with you and/or your care-partner.   Information on diverticulosis given to you today  Resume usual medications and diet

## 2016-03-30 NOTE — Op Note (Addendum)
[facility] Patient Name: Jaiya Aceves Procedure Date: 03/30/2016 8:35 AM MRN: [id number] Endoscopist: Milus Banister , MD Age: 70 Referring MD:  Date of Birth: 05-16-1946 Gender: Female Account #: [id number] Procedure:                Colonoscopy Indications:              High risk colon cancer surveillance: Personal                            history of colon cancer; Stage IIIa (T2 N1)                            adenocarcinoma of the distal transverse                            colon/splenic flexure status post partial colectomy                            12/18/2011. ?Adjuvant FOLFOX chemotherapy was                            initiated on 01/21/2012; colonoscopy Dr. Ardis Hughs                            02/2013 single HP, otherwise normal. Medicines:                Monitored Anesthesia Care Procedure:                Pre-Anesthesia Assessment:                           - Prior to the procedure, a History and Physical                            was performed, and patient medications and                            allergies were reviewed. The patient's tolerance of                            previous anesthesia was also reviewed. The risks                            and benefits of the procedure and the sedation                            options and risks were discussed with the patient.                            All questions were answered, and informed consent                            was obtained. Prior Anticoagulants: The patient has  taken no previous anticoagulant or antiplatelet                            agents. ASA Grade Assessment: II - A patient with                            mild systemic disease. After reviewing the risks                            and benefits, the patient was deemed in                            satisfactory condition to undergo the procedure.                           After obtaining informed consent, the  colonoscope                            was passed under direct vision. Throughout the                            procedure, the patient's blood pressure, pulse, and                            oxygen saturations were monitored continuously. The                            Model CF-HQ190L [id number]) scope was introduced                            through the anus and advanced to the the cecum,                            identified by appendiceal orifice and ileocecal                            valve. The colonoscopy was performed without                            difficulty. The patient tolerated the procedure                            well. The quality of the bowel preparation was                            excellent. The ileocecal valve, appendiceal                            orifice, and rectum were photographed. Scope In: 8:44:27 AM Scope Out: 8:52:21 AM Scope Withdrawal Time: 0 hours 5 minutes 47 seconds  Total Procedure Duration: 0 hours 7 minutes 54 seconds  Findings:                 There was evidence of a prior end-to-side  colo-colonic anastomosis in the descending colon.                            This was completely normal appearing.                           Left sided diverticulosis.                           The exam was otherwise without abnormality on                            direct and retroflexion views. Complications:            No immediate complications. Estimated blood loss:                            None. Estimated Blood Loss:     Estimated blood loss: none. Impression:               - Normal segmental colectomy anastomosis.                           - Left sided diverticulosis.                           - The examination was otherwise normal on direct                            and retroflexion views.                           - No polyps or cancers. Recommendation:           - Patient has a contact number available for                             emergencies. The signs and symptoms of potential                            delayed complications were discussed with the                            patient. Return to normal activities tomorrow.                            Written discharge instructions were provided to the                            patient.                           - Resume previous diet.                           - Continue present medications.                           -  Repeat colonoscopy in 5 years for surveillance. Milus Banister, MD 03/30/2016 8:57:51 AM This report has been signed electronically.

## 2016-03-30 NOTE — Progress Notes (Signed)
To recovery, report to [place], RN, VSS 

## 2016-04-02 ENCOUNTER — Telehealth: Payer: Self-pay

## 2016-04-02 NOTE — Telephone Encounter (Signed)
  Follow up Call-  Call back number 03/30/2016  Post procedure Call Back phone  # [phone]  Permission to leave phone message Yes  Some recent data might be hidden    Patient was called for follow up after her procedure on 03/30/2016. No answer at the number given for follow up phone call. A message was left on the answering machine.

## 2016-04-02 NOTE — Telephone Encounter (Signed)
  Follow up Call-  Call back number 03/30/2016  Post procedure Call Back phone  # [phone]  Permission to leave phone message Yes  Some recent data might be hidden     Patient questions:  Do you have a fever, pain , or abdominal swelling? No. Pain Score  0 *  Have you tolerated food without any problems? Yes.    Have you been able to return to your normal activities? Yes.    Do you have any questions about your discharge instructions: Diet   No. Medications  No. Follow up visit  No.  Do you have questions or concerns about your Care? No.  Actions: * If pain score is 4 or above: No action needed, pain <4.

## 2016-07-02 ENCOUNTER — Other Ambulatory Visit: Payer: Self-pay | Admitting: Nurse Practitioner

## 2016-09-24 ENCOUNTER — Ambulatory Visit (HOSPITAL_BASED_OUTPATIENT_CLINIC_OR_DEPARTMENT_OTHER): Payer: Medicare PPO | Admitting: Oncology

## 2016-09-24 ENCOUNTER — Other Ambulatory Visit: Payer: Medicare PPO

## 2016-09-24 VITALS — BP 156/86 | HR 63 | Temp 97.7°F | Resp 18 | Wt 167.1 lb

## 2016-09-24 DIAGNOSIS — C184 Malignant neoplasm of transverse colon: Secondary | ICD-10-CM | POA: Diagnosis not present

## 2016-09-24 DIAGNOSIS — Z86718 Personal history of other venous thrombosis and embolism: Secondary | ICD-10-CM | POA: Diagnosis not present

## 2016-09-24 LAB — CEA (IN HOUSE-CHCC): CEA (CHCC-IN HOUSE): 1.49 ng/mL (ref 0.00–5.00)

## 2016-09-24 NOTE — Progress Notes (Signed)
  [facility] OFFICE PROGRESS NOTE   Diagnosis: Colon cancer  INTERVAL HISTORY:   Robin.Luna returns as scheduled. She feels well. She reports occasional abdominal "bloating". No nausea or difficulty with bowel function. She is returning to work. A colonoscopy 03/30/2016 revealed no polyps.   Objective:  Vital signs in last 24 hours:  Blood pressure (!) 156/86, pulse 63, temperature 97.7 F (36.5 C), temperature source Oral, resp. rate 18, weight 167 lb 1.6 oz (75.8 kg), SpO2 100 %.    HEENT:  neck without mass Lymphatics:  no cervical, supraclavicular, axillary, or inguinal nodes Resp:  distant breath sounds, lungs clear bilaterally, no respiratory distress Cardio:  regular rate and rhythm GI:  no hepatosplenomegaly, no mass, nontender Vascular:  no leg edema     Lab Results: CEA-pending   Medications: I have reviewed the patient's current medications.  Assessment/Plan: 1. 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 Adjuvant FOLFOX chemotherapy was initiated on 01/21/2012. The last cycle was completed on 06/30/2012. Restaging CT scans 09/30/2012 showed no evidence of recurrent/metastatic disease. -Surveillance colonoscopy 02/24/2013 with a single hyperplastic polyp. Next colonoscopy recommended at a 3 year interval. -Surveillance CT scans 10/12/2013 with no evidence of metastatic disease  -Surveillance CT scans 10/14/2014 with no evidence of metastatic disease -Negative surveillance colonoscopy 03/30/2016  2. Delayed nausea following cycle 2 FOLFOX. Aloxi was added with cycle 3. Persistent delayed nausea. Emend was added with cycle 4. She noted improvement in the nausea following cycle 4. She declined steroid prophylaxis.  3. Tiny lung nodules seen on a CT of the chest 10/19/2011. Stable on CT of the chest 10/12/2013 and 10/14/2014 4. History of tobacco use 5. Endometrial  thickening on CT of the abdomen 10/02/2011. Progressive endometrial thickening on CT 09/30/2012. Status post removal of an endometrial polyp 02/03/2013 with benign pathology. 6. Port-A-Cath placement 01/09/2012 in interventional radiology. Removed 12/30/2012. 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 The finger numbness has improved.  8. Mild thrombocytopenia secondary to chemotherapy-resolved. 9. History of neutropenia secondary to chemotherapy. 10. 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    Disposition: Robin Marksberry remains in clinical remission from colon cancer. We will follow-up on the CEA from today. She would like to continue follow-up at the [facility]. She will return for an office visit in one year.  15 minutes were spent with the patient today. The majority of the time was used for counseling and coordination of care.  Betsy Coder, MD  09/24/2016  12:11 PM

## 2016-09-25 LAB — CEA: CEA1: 2.1 ng/mL (ref 0.0–4.7)

## 2016-09-28 ENCOUNTER — Telehealth: Payer: Self-pay | Admitting: *Deleted

## 2016-09-28 ENCOUNTER — Telehealth: Payer: Self-pay | Admitting: Oncology

## 2016-09-28 NOTE — Telephone Encounter (Signed)
Patient notified that cea is normal per order of Dr. Benay Spice.  Patient appreciative of call and has no questions at this time.

## 2016-09-28 NOTE — Telephone Encounter (Signed)
-----   Message from Ladell Pier, MD sent at 09/28/2016  7:14 AM EDT ----- Please call patient, [name] is normal

## 2016-09-28 NOTE — Telephone Encounter (Signed)
Appointments scheduled per 4.2.18 LOS. Patient notified. ° °

## 2017-02-11 DIAGNOSIS — R42 Dizziness and giddiness: Secondary | ICD-10-CM | POA: Diagnosis not present

## 2017-02-11 DIAGNOSIS — F17210 Nicotine dependence, cigarettes, uncomplicated: Secondary | ICD-10-CM | POA: Diagnosis not present

## 2017-02-11 DIAGNOSIS — R5383 Other fatigue: Secondary | ICD-10-CM | POA: Diagnosis not present

## 2017-02-11 LAB — BASIC METABOLIC PANEL
ANION GAP: 6 (ref 5–15)
BUN: 14 mg/dL (ref 6–20)
CO2: 26 mmol/L (ref 22–32)
Calcium: 9.1 mg/dL (ref 8.9–10.3)
Chloride: 109 mmol/L (ref 101–111)
Creatinine, Ser: 0.73 mg/dL (ref 0.44–1.00)
GFR calc non Af Amer: >60 mL/min (ref >60)
GLUCOSE: 104 mg/dL — AB (ref 65–99)
POTASSIUM: 3.3 mmol/L — AB (ref 3.5–5.1)
Sodium: 141 mmol/L (ref 135–145)

## 2017-02-11 LAB — CBC
HEMATOCRIT: 35.1 % — AB (ref 36.0–46.0)
HEMOGLOBIN: 11.6 g/dL — AB (ref 12.0–15.0)
MCH: 29.7 pg (ref 26.0–34.0)
MCHC: 33 g/dL (ref 30.0–36.0)
MCV: 90 fL (ref 78.0–100.0)
Platelets: 262 10*3/uL (ref 150–400)
RBC: 3.9 MIL/uL (ref 3.87–5.11)
RDW: 13.3 % (ref 11.5–15.5)
WBC: 7.4 10*3/uL (ref 4.0–10.5)

## 2017-02-11 LAB — CBG MONITORING, ED: Glucose-Capillary: 100 mg/dL — ABNORMAL HIGH (ref 65–99)

## 2017-02-12 ENCOUNTER — Emergency Department (HOSPITAL_COMMUNITY)
Admission: EM | Admit: 2017-02-12 | Discharge: 2017-02-12 | Disposition: A | Discharge status: Home / Self Care | Payer: BC Managed Care – PPO | Attending: Emergency Medicine | Admitting: Emergency Medicine

## 2017-02-12 DIAGNOSIS — R42 Dizziness and giddiness: Secondary | ICD-10-CM

## 2017-02-12 DIAGNOSIS — R5383 Other fatigue: Secondary | ICD-10-CM

## 2017-02-12 LAB — URINALYSIS, ROUTINE W REFLEX MICROSCOPIC
Bilirubin Urine: NEGATIVE
GLUCOSE, UA: NEGATIVE mg/dL
Hgb urine dipstick: NEGATIVE
KETONES UR: NEGATIVE mg/dL
NITRITE: NEGATIVE
PROTEIN: NEGATIVE mg/dL
Specific Gravity, Urine: 1.015 (ref 1.005–1.030)
pH: 5 (ref 5.0–8.0)

## 2017-02-12 LAB — TROPONIN I: Troponin I: <0.03 ng/mL (ref <0.03)

## 2017-02-12 MED ORDER — SODIUM CHLORIDE 0.9 % IV BOLUS (SEPSIS)
1000.0000 mL | Freq: Once | INTRAVENOUS | Status: AC
  Administered 2017-02-12: 1000 mL via INTRAVENOUS

## 2017-02-12 NOTE — ED Provider Notes (Signed)
[facility] DEPT Provider Note   CSN: [id number] Arrival date & time: 02/11/17  2132     History   Chief Complaint No chief complaint on file.   HPI [name] is a 71 y.o. female.  HPI  This is a 72 year old female with a history of colon cancer, DVT, kidney stones who presents with generalized fatigue and malaise. Patient reports over the last 24 hours she has just not felt well. She works overnight and at 2:00 the morning yesterday began to feel fatigued and chilled. She reports that all day she just hasn't felt well. Denies subjective fevers but does report chills. She reports occasional lightheadedness. Denies chest pain, cough, shortness of breath, abdominal pain, nausea, vomiting, diarrhea. Denies any focal weakness or numbness, vision changes. She's not taken anything for her symptoms.  Past Medical History:  Diagnosis Date  . Abdominal pain   . Allergy   . Cancer ([facility]) 11/2011   cancer in colon  . Colon cancer ([facility]) 12/18/2011  . DVT (deep venous thrombosis) ([facility]) 05/2012   (R)IJ and subclavian, stopped Coumadin July 2014  . History of kidney stones   . Irregular heart rate    since child    Patient Active Problem List   Diagnosis Date Noted  . Leukopenia 08/09/2015  . Severe sepsis ([facility]) 08/09/2015  . Acute kidney injury ([facility]) 08/09/2015  . Pyelonephritis 08/08/2015  . DVT (deep venous thrombosis) ([facility]) 06/16/2012  . Cancer ([facility]) 01/20/2012  . Colon cancer ([facility]) 12/18/2011  . Nonspecific (abnormal) findings on radiological and other examination of gastrointestinal tract 10/03/2011    Past Surgical History:  Procedure Laterality Date  . CHOLECYSTECTOMY    . COLON RESECTION  12/18/2011   Procedure: COLON RESECTION LAPAROSCOPIC;  Surgeon: Joyice Faster. [facility], MD;  Location: [facility];  Service: General;  Laterality: N/A;  . COLON SURGERY    . COLONOSCOPY    . DILATATION & CURRETTAGE/HYSTEROSCOPY WITH RESECTOCOPE N/A 02/03/2013   Procedure: DILATATION &  CURETTAGE/HYSTEROSCOPY, removal  of endometrial mass;  Surgeon: Sharene Butters, MD;  Location: [facility];  Service: Gynecology;  Laterality: N/A;  . goiter removal     . KIDNEY STONE SURGERY    . PORTACATH PLACEMENT  11/2011   12 treatments of chemo/ last treatment 2014  . portacath removal  12/30/12  . TONSILLECTOMY     as child    OB History    Gravida Para Term Preterm AB Living   0 0 0 0 0     SAB TAB Ectopic Multiple Live Births   0 0 0           Home Medications    Prior to Admission medications   Medication Sig Start Date End Date Taking? Authorizing Provider  acetaminophen (TYLENOL) 500 MG tablet Take 1,000 mg by mouth every 6 (six) hours as needed for headache.   Yes [provider]    Family History Family History  Problem Relation Age of Onset  . Breast cancer Mother   . Heart disease Mother   . Hypertension Other   . Colon polyps Neg Hx   . Esophageal cancer Neg Hx   . Stomach cancer Neg Hx   . Rectal cancer Neg Hx     Social History Social History  Substance Use Topics  . Smoking status: Current Some Day Smoker    Packs/day: 0.25    Years: 35.00    Types: Cigarettes  . Smokeless tobacco: Never Used  Comment: smoke 2 cigarettes a day/occasional  . Alcohol use No     Allergies   Aspirin; Advil [ibuprofen]; Aleve [naproxen sodium]; and Penicillins   Review of Systems Review of Systems  Constitutional: Positive for chills and fatigue. Negative for fever.  Respiratory: Negative for cough and shortness of breath.   Cardiovascular: Negative for chest pain.  Gastrointestinal: Negative for abdominal pain, nausea and vomiting.  Genitourinary: Negative for dysuria.  All other systems reviewed and are negative.    Physical Exam Updated Vital Signs BP (!) 150/89   Pulse (!) 58   Temp 97.6 F (36.4 C) (Oral)   Resp 18   Ht 5\' 5"  (1.651 m)   Wt 70.8 kg (156 lb)   SpO2 100%   BMI 25.96 kg/m   Physical Exam  Constitutional: She is  oriented to person, place, and time. No distress.  HENT:  Head: Normocephalic and atraumatic.  Eyes: Pupils are equal, round, and reactive to light.  Cardiovascular: Normal rate, regular rhythm and normal heart sounds.   No murmur heard. Pulmonary/Chest: Effort normal and breath sounds normal. No respiratory distress. She has no wheezes.  Abdominal: Soft. Bowel sounds are normal. She exhibits no mass. There is no tenderness. There is no guarding.  Musculoskeletal: She exhibits no edema.  Neurological: She is alert and oriented to person, place, and time.  Cranial nerves II through XII intact, 5 out of 5 strength in all 4 extremity, no dysmetria to finger-nose-finger  Skin: Skin is warm and dry.  Psychiatric: She has a normal mood and affect.  Nursing note and vitals reviewed.    ED Treatments / Results  Labs (all labs ordered are listed, but only abnormal results are displayed) Labs Reviewed  BASIC METABOLIC PANEL - Abnormal; Notable for the following:       Result Value   Potassium 3.3 (*)    Glucose, Bld 104 (*)    All other components within normal limits  CBC - Abnormal; Notable for the following:    Hemoglobin 11.6 (*)    HCT 35.1 (*)    All other components within normal limits  URINALYSIS, ROUTINE W REFLEX MICROSCOPIC - Abnormal; Notable for the following:    APPearance HAZY (*)    Leukocytes, UA SMALL (*)    Bacteria, UA RARE (*)    Squamous Epithelial / LPF 0-5 (*)    All other components within normal limits  CBG MONITORING, ED - Abnormal; Notable for the following:    Glucose-Capillary 100 (*)    All other components within normal limits  TROPONIN I  CBG MONITORING, ED    EKG  EKG Interpretation  Date/Time:  Monday February 11 2017 21:47:41 EDT Ventricular Rate:  70 PR Interval:    QRS Duration: 91 QT Interval:  449 QTC Calculation: 485 R Axis:   11 Text Interpretation:  Sinus rhythm Abnormal R-wave progression, early transition No significant change  since last tracing Confirmed by Thayer Jew [phone]) on 02/12/2017 2:30:08 AM       Radiology No results found.  Procedures Procedures (including critical care time)  Medications Ordered in ED Medications  sodium chloride 0.9 % bolus 1,000 mL (1,000 mLs Intravenous New Bag/Given 02/12/17 0226)     Initial Impression / Assessment and Plan / ED Course  I have reviewed the triage vital signs and the nursing notes.  Pertinent labs & imaging results that were available during my care of the patient were reviewed by me and considered in my medical  decision making (see chart for details).     Patient presents with vague fatigue, dizziness and lightheadedness. Nontoxic on exam. Vital signs largely reassuring. She is nonfocal. Denies any other physical complaints.basic labwork obtained. Orthostatics negative. Patient given fluids. Lab work is reassuring.EKG is nonischemic. Troponin is negative. On recheck, patient reports some improvement with IV fluids. Etiology of symptoms unclear at this time; however, do not feel she needs further imaging or workup. Recommend hydration and rest over the next 12-24 hours. Follow-up with PCP if not improving.  After history, exam, and medical workup I feel the patient has been appropriately medically screened and is safe for discharge home. Pertinent diagnoses were discussed with the patient. Patient was given return precautions.   Final Clinical Impressions(s) / ED Diagnoses   Final diagnoses:  Fatigue, unspecified type  Dizziness    New Prescriptions New Prescriptions   No medications on file     Merryl Hacker, MD 02/12/17 [phone]

## 2017-02-12 NOTE — ED Notes (Signed)
Pt aware she needs to give urine sample

## 2017-02-12 NOTE — ED Notes (Signed)
MD [facility] aware of IVS.

## 2017-02-12 NOTE — Discharge Instructions (Signed)
Were seen today for generalized fatigue and dizziness. Your workup is largely reassuring. Make sure to rest and stay well hydrated. Follow up with your primary physician in one to 2 days if not improving. If you develop any new or worsening symptoms including chest pain, shortness of breath, you should be reevaluated.

## 2017-03-26 ENCOUNTER — Telehealth: Payer: Self-pay | Admitting: *Deleted

## 2017-03-26 ENCOUNTER — Telehealth: Payer: Self-pay | Admitting: Oncology

## 2017-03-26 NOTE — Telephone Encounter (Signed)
Scheduled appt per patient request for inj- FLU SHOT - patient is aware of appt date and time.

## 2017-03-26 NOTE — Telephone Encounter (Signed)
Pt came in to schedule appointment for flu vaccine. She is also requesting pneumococcal vaccine. Spoke with pt in lobby, she reports intermittent abdominal cramping after meals. She thinks this may be related to constipation. Instructed her to contact GI if this persists. Pt also reported intermittent B ankle edema that gets better overnight. Recommended she see a primary care MD. Pt refuses, stated she only trusts Dr. Benay Spice.  Will review with MD for vaccine orders.

## 2017-03-27 NOTE — Telephone Encounter (Signed)
Per Dr. Benay Spice: OK for flu and prevnar vaccines. Pt needs to establish with a PCP for other medical issues.

## 2017-04-01 ENCOUNTER — Ambulatory Visit (HOSPITAL_BASED_OUTPATIENT_CLINIC_OR_DEPARTMENT_OTHER): Payer: BC Managed Care – PPO

## 2017-04-01 DIAGNOSIS — Z23 Encounter for immunization: Secondary | ICD-10-CM

## 2017-04-01 DIAGNOSIS — C801 Malignant (primary) neoplasm, unspecified: Secondary | ICD-10-CM

## 2017-04-01 MED ORDER — INFLUENZA VAC SPLIT HIGH-DOSE 0.5 ML IM SUSY
0.5000 mL | PREFILLED_SYRINGE | INTRAMUSCULAR | Status: AC
  Administered 2017-04-01: 0.5 mL via INTRAMUSCULAR
  Filled 2017-04-01: qty 0.5

## 2017-04-01 MED ORDER — PNEUMOCOCCAL 13-VAL CONJ VACC IM SUSP
0.5000 mL | Freq: Once | INTRAMUSCULAR | Status: AC
  Administered 2017-04-01: 0.5 mL via INTRAMUSCULAR
  Filled 2017-04-01: qty 0.5

## 2017-05-25 IMAGING — CT CT ABD-PELV W/ CM
2 of 5 series · 14 of 46 positions shown, 16 images · IV contrast (ISOVUE)
Comparison: 08/08/2015

CLINICAL DATA: Diffuse abdominal pain, epigastric pain, weakness,
history of colon cancer, status post colectomy

EXAM:
CT ABDOMEN AND PELVIS WITH CONTRAST
TECHNIQUE: Multidetector CT imaging of the abdomen and pelvis was performed
using the standard protocol following bolus administration of
intravenous contrast.
CONTRAST:  100mL [id number] IOPAMIDOL ([id number]) INJECTION 61%

[Series 2: abd/pel with · axial · 0.72mm/px · z∈[+1000,+1410]mm · 11 of 94 slices shown, 13 images]
[im 6/94  soft-tissue]
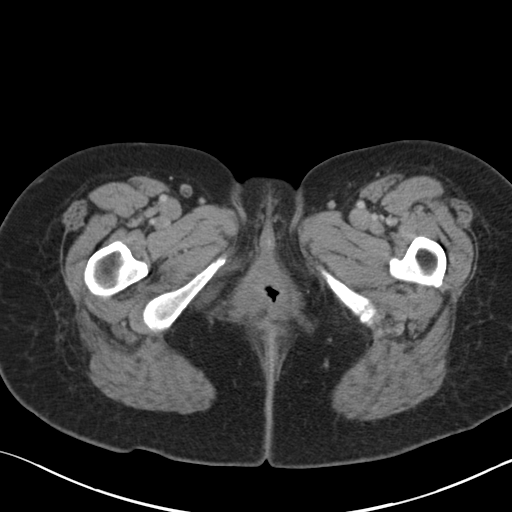
[im 6/94  bone]
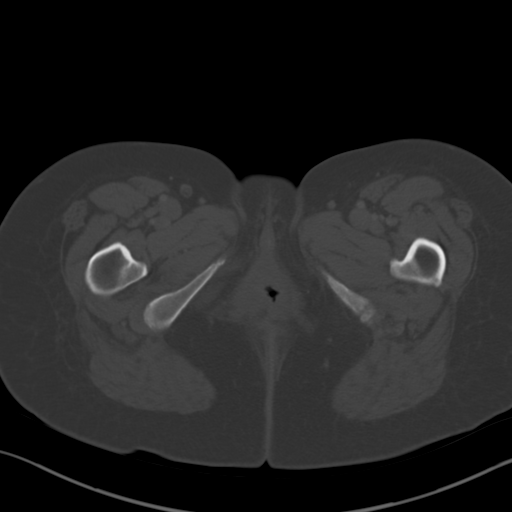
[im 17/94  soft-tissue]
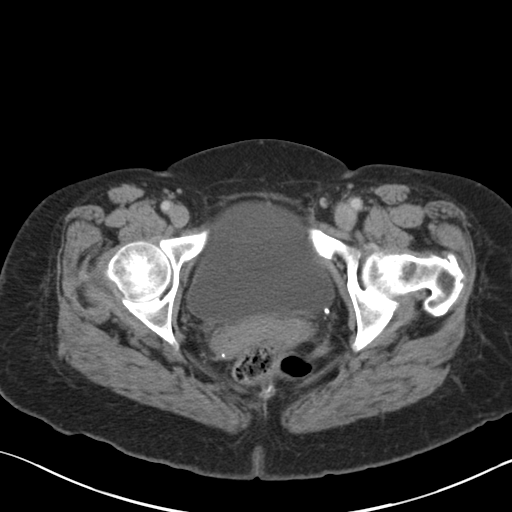
[im 22/94  soft-tissue]
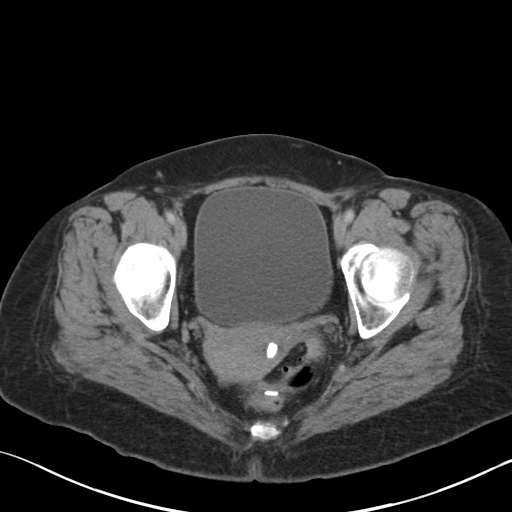
[im 33/94  soft-tissue]
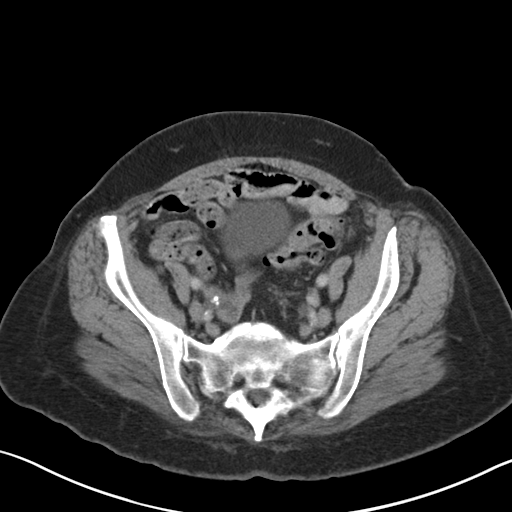
[im 39/94  soft-tissue]
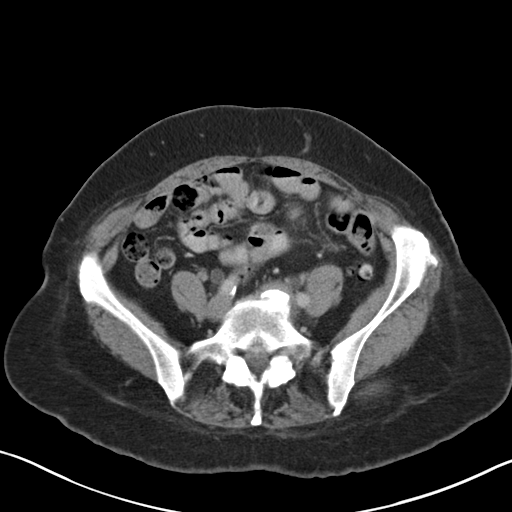
[im 50/94  soft-tissue]
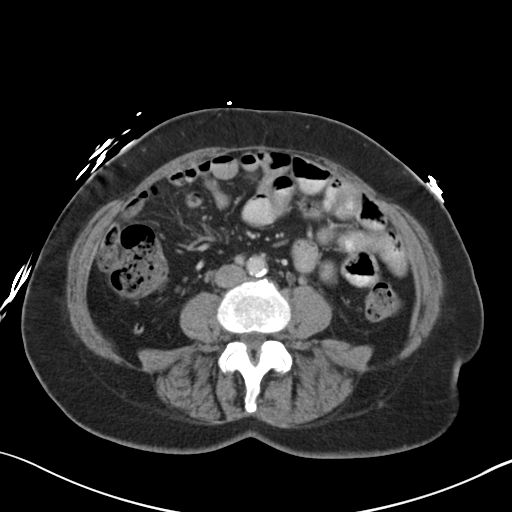
[im 55/94  soft-tissue]
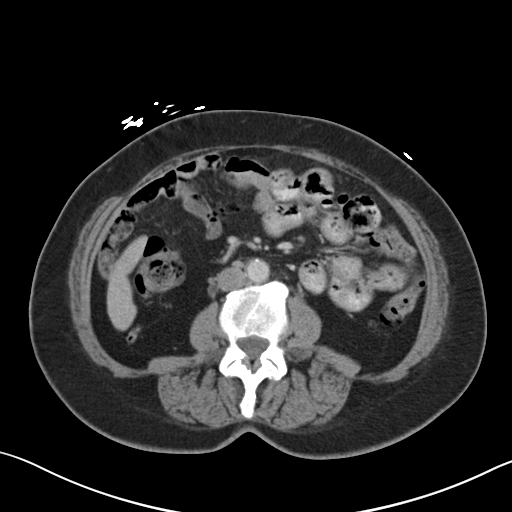
[im 61/94  soft-tissue]
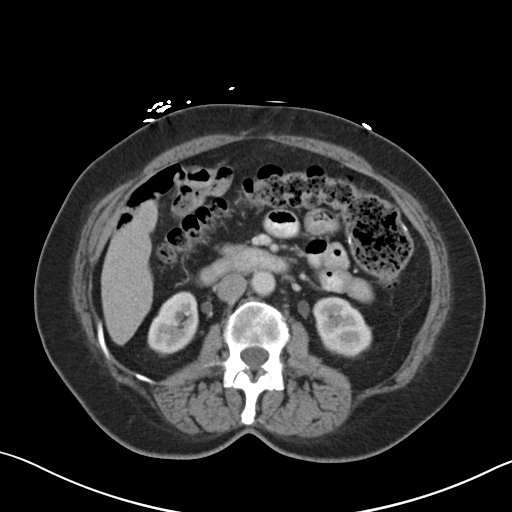
[im 72/94  soft-tissue]
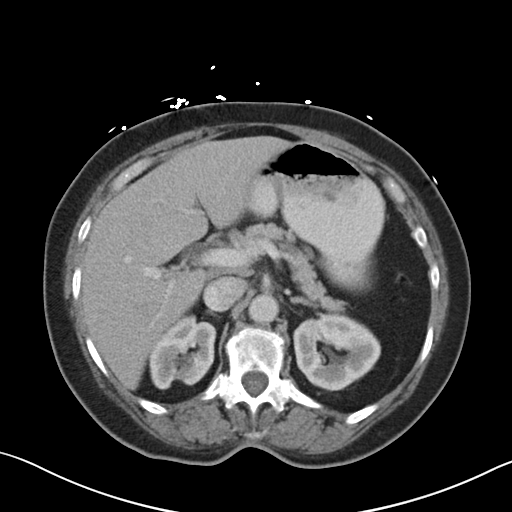
[im 72/94  bone]
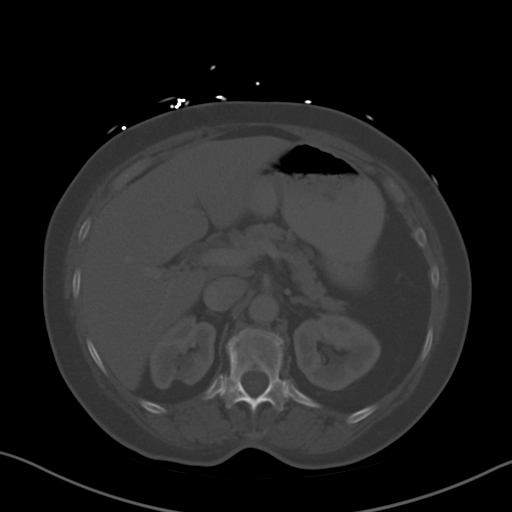
[im 77/94  soft-tissue]
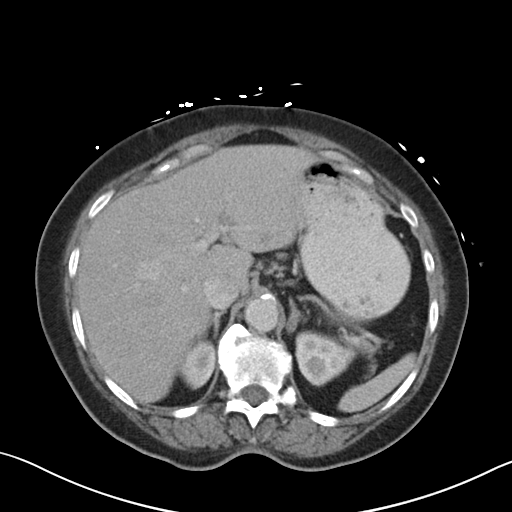
[im 88/94  soft-tissue]
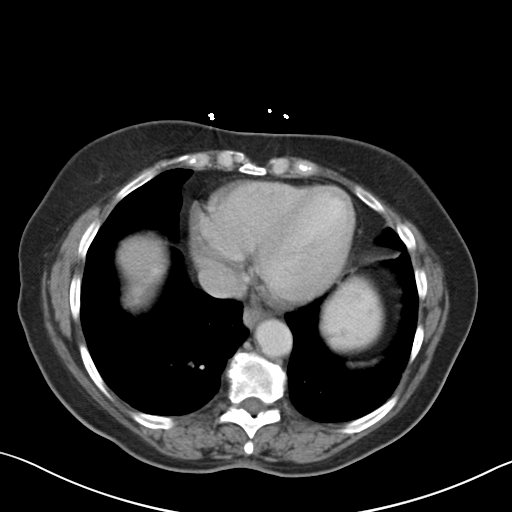

[Series 5: coronal a/|p · coronal · 0.67mm/px · 3 of 130 slices shown]
[im 44/130  soft-tissue]
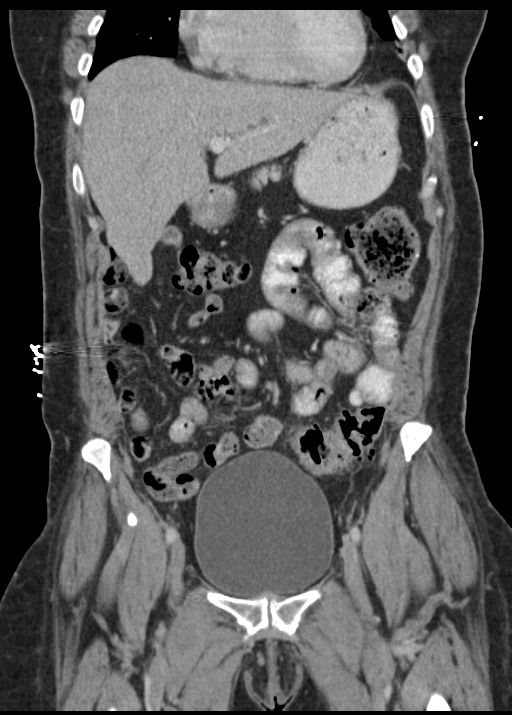
[im 58/130  soft-tissue]
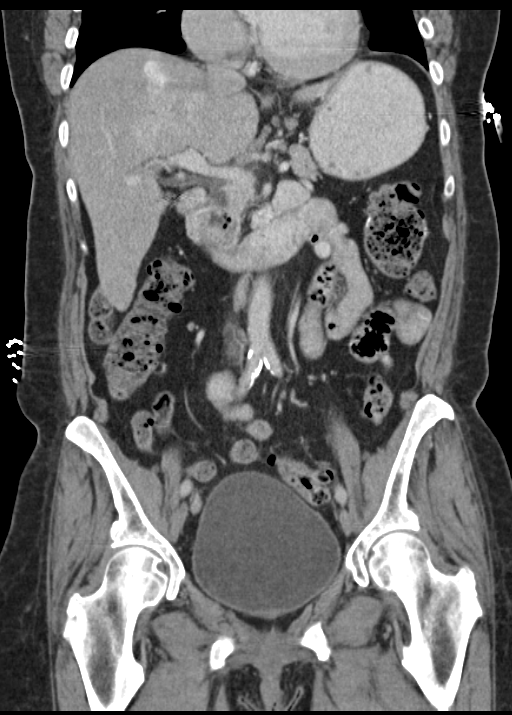
[im 72/130  soft-tissue]
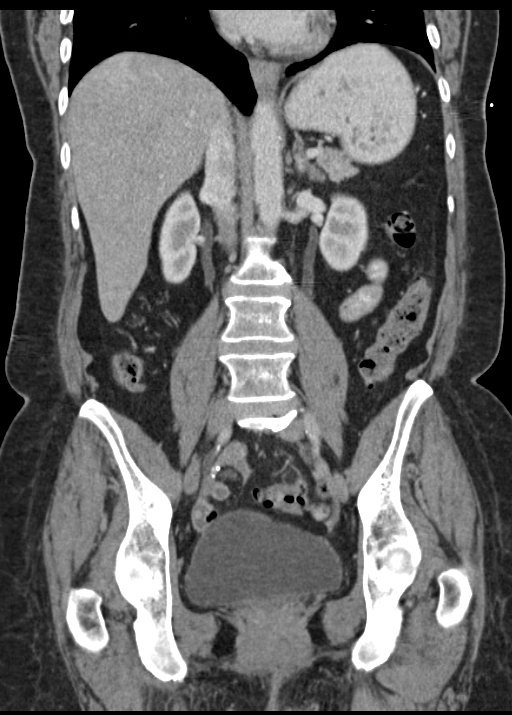

[14 of 46 positions shown; findings below may reference images not displayed]

FINDINGS: Lower chest:  The lung bases are unremarkable.  Small hiatal hernia.

Hepatobiliary: State stable focal enhancing lesion in the dome of
the liver measures 1.9 cm stable in size in appearance from prior
exam. A cyst in left hepatic lobe is stable.

Status postcholecystectomy.

Pancreas: Enhanced pancreas is unremarkable.

Spleen: Stable hypodense cyst or hemangioma within spleen.

Adrenals/Urinary Tract: No adrenal gland mass. Kidneys are
symmetrical in size and enhancement. No hydronephrosis or
hydroureter.

Delayed renal images shows bilateral renal symmetrical excretion.
Bilateral visualized proximal ureter is unremarkable.

Moderate distended urinary bladder. No bladder filling defects are
noted.

Stomach/Bowel: No gastric outlet obstruction. No small bowel
obstruction. Again noted postsurgical changes within distal
transverse colon. Abundant stool noted in transverse colon. Moderate
stool noted in descending colon and cecum. No pericecal
inflammation. Normal appendix is noted in axial image 50. Scattered
diverticula are noted descending colon. Multiple sigmoid colon
diverticula.

In axial image 59 there is mild thickening of diverticular wall in
proximal sigmoid colon and mild stranding of surrounding fat. This
is confirmed in coronal image 42. Findings are consistent with mild
diverticulitis. There is no evidence of diverticular abscess. No
extraluminal contrast material. Again noted muscular hypertrophy in
distal sigmoid colon.

Vascular/Lymphatic: Atherosclerotic calcifications of abdominal
aorta and iliac arteries. No aortic aneurysm. No retroperitoneal or
mesenteric adenopathy.

Reproductive: Mild retroflexed uterus. A calcified fibroid within
uterus measures about 1.2 cm.

Other: Stable 3 cm duodenal diverticulum without evidence of acute
complication. No ascites or free abdominal air. Again noted small
umbilical hernia containing fat and partial small bowel loop. There
is no evidence of acute complication or small bowel obstruction.

Musculoskeletal: No destructive bony lesions are noted. Degenerative
changes lumbar spine. Mild degenerative changes pubic symphysis.
IMPRESSION: 1. There is mild acute diverticulitis in proximal sigmoid colon left
pelvis axial image 59 and 58. No evidence of diverticular abscess or
perforation.
2. Normal appendix.  No pericecal inflammation.
3. Again noted postsurgical changes transverse colon. Abundant stool
noted in transverse colon. Moderate stool noted in descending colon.
4. No small bowel obstruction.
5. No hydronephrosis or hydroureter.
6. Tiny umbilical hernia containing omental fat and partial small
bowel loop without evidence of acute complication or small bowel
obstruction.

## 2017-05-25 IMAGING — CR DG CHEST 2V
2 series · 2 of 2 positions shown · non-contrast
Comparison: 08/11/2015

CLINICAL DATA: Weakness, epigastric pain radiating to back, "it
feels like a tractor trailer ran over me", generalized body aches
and weakness beginning at 4377 hours today, history colon cancer,
smoker

EXAM:
CHEST  2 VIEW

[w chest pa]
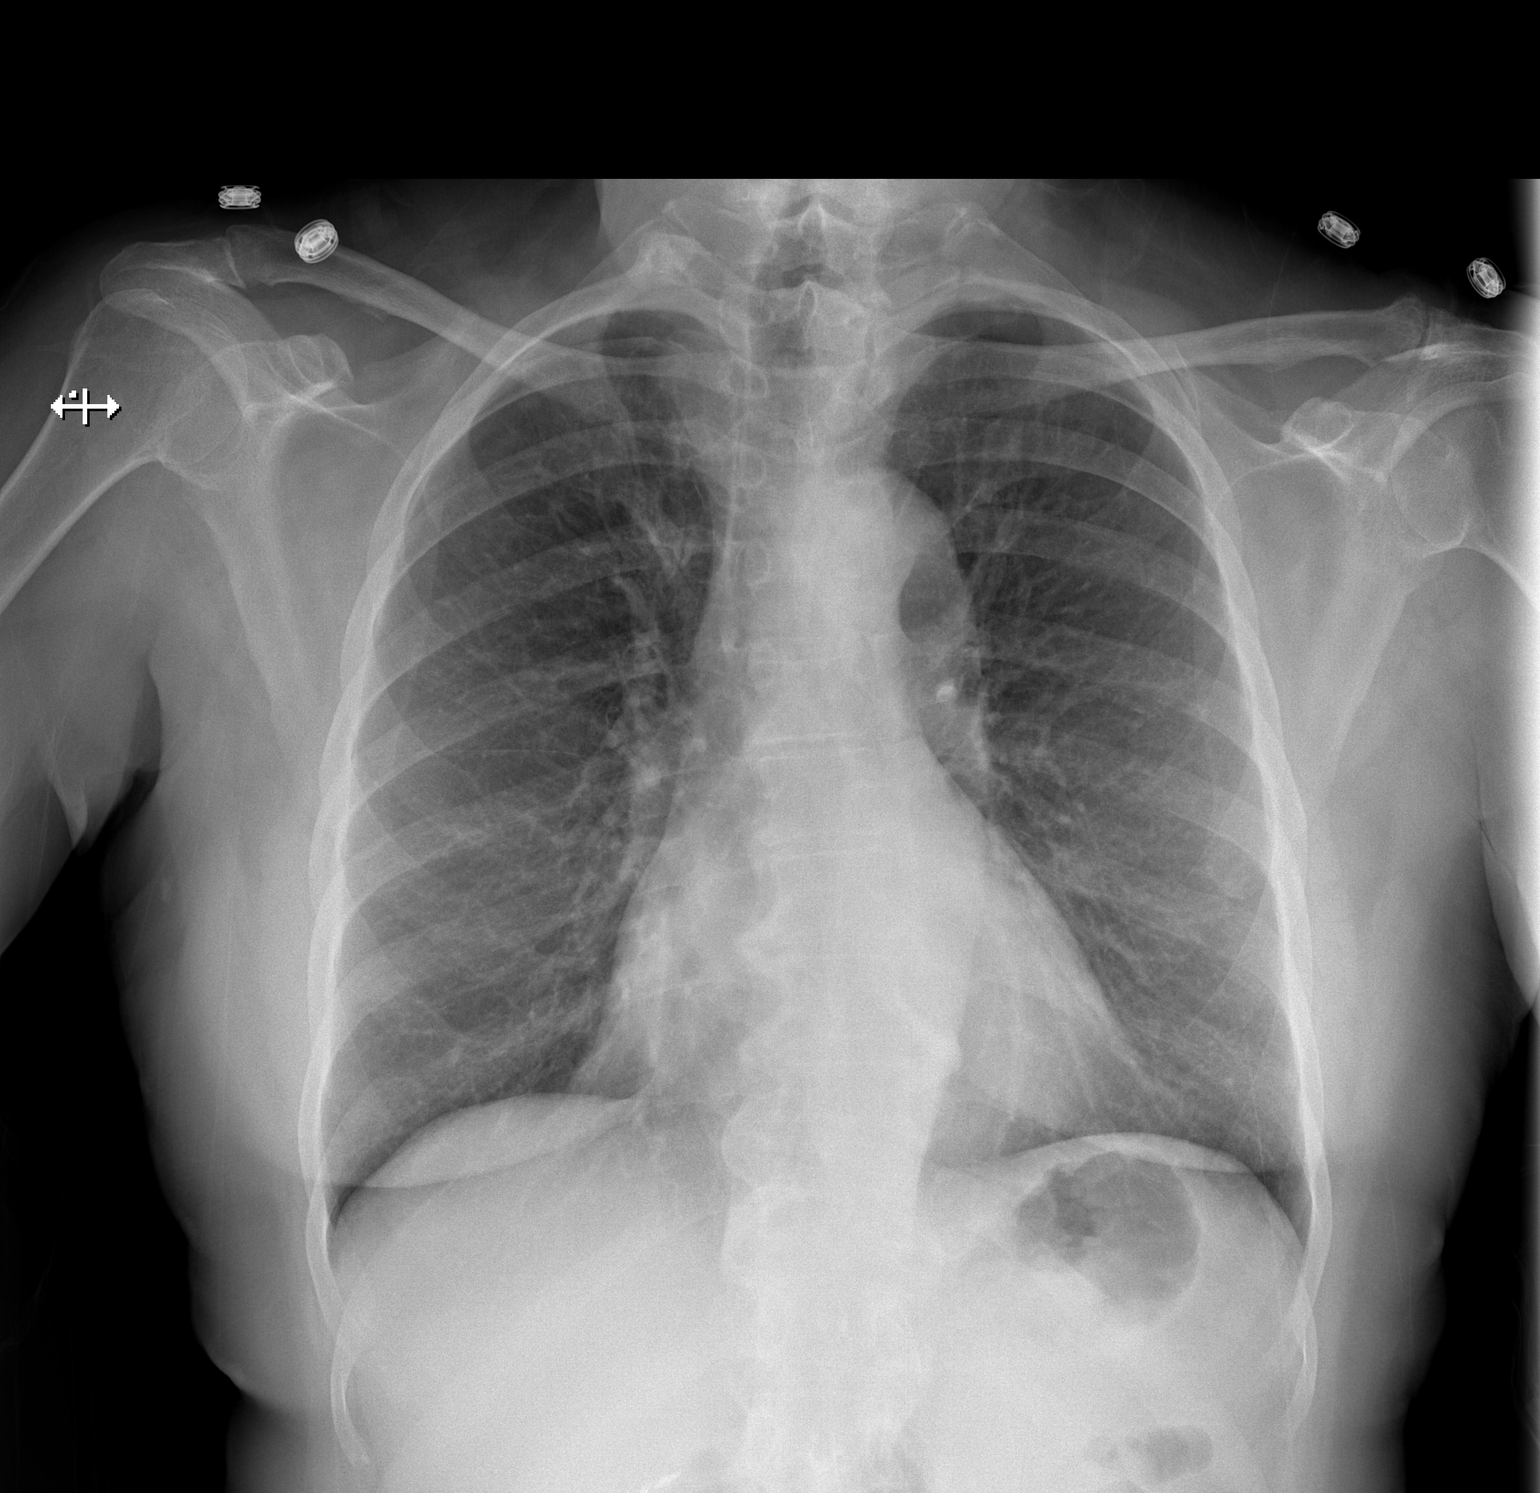

[w chest lat]
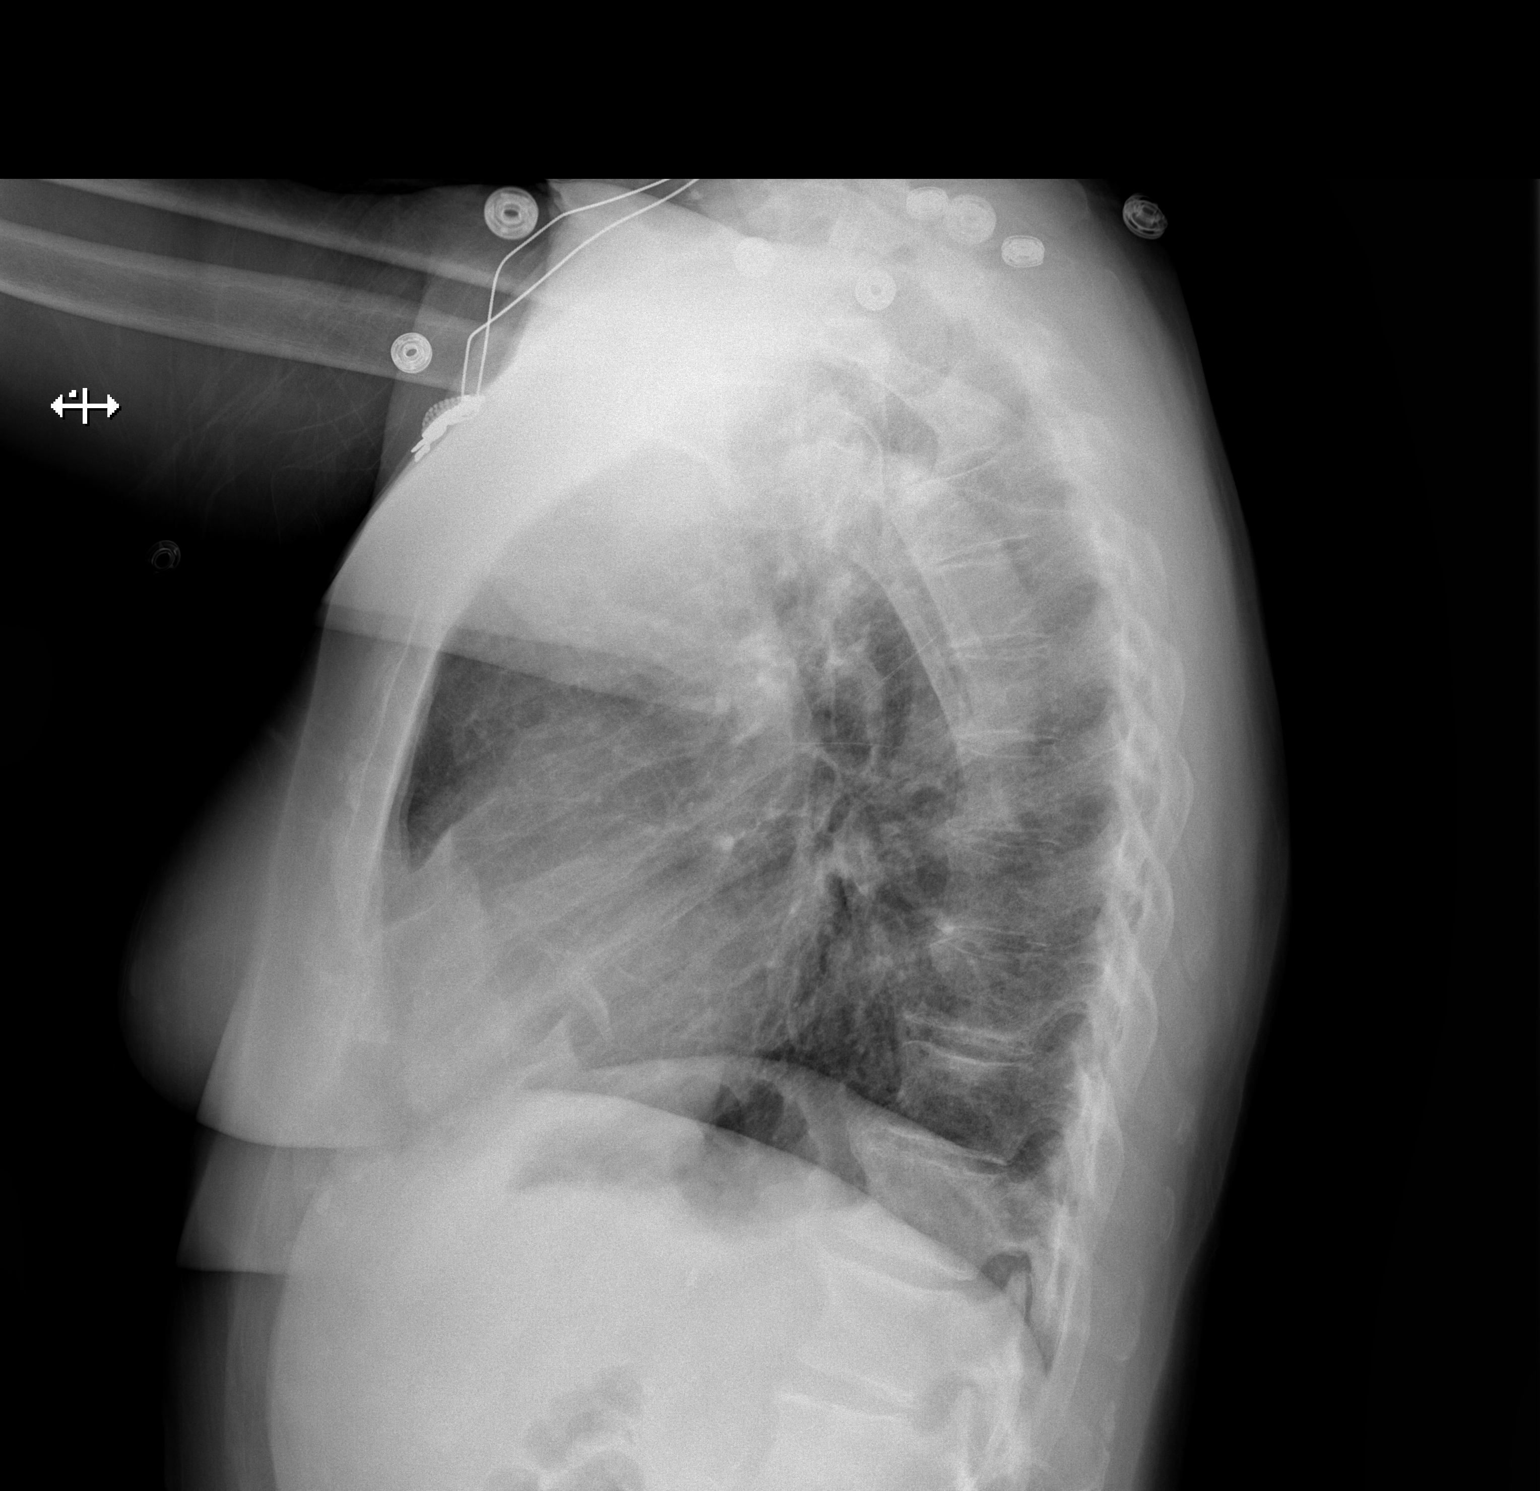

[2 of 2 positions shown; findings below may reference images not displayed]

FINDINGS: Upper normal heart size.

Mediastinal contours and pulmonary vascularity normal.

Lungs clear.

No pleural effusion or pneumothorax.

Endplate spur formation lower thoracic spine.
IMPRESSION: No acute abnormalities.

## 2017-09-23 ENCOUNTER — Inpatient Hospital Stay: Payer: BC Managed Care – PPO

## 2017-09-23 ENCOUNTER — Inpatient Hospital Stay: Payer: BC Managed Care – PPO | Attending: Oncology | Admitting: Oncology

## 2017-09-23 ENCOUNTER — Telehealth: Payer: Self-pay | Admitting: Oncology

## 2017-09-23 VITALS — BP 161/65 | HR 65 | Temp 98.1°F | Resp 15 | Wt 156.0 lb

## 2017-09-23 DIAGNOSIS — Z9221 Personal history of antineoplastic chemotherapy: Secondary | ICD-10-CM | POA: Insufficient documentation

## 2017-09-23 DIAGNOSIS — R110 Nausea: Secondary | ICD-10-CM

## 2017-09-23 DIAGNOSIS — R918 Other nonspecific abnormal finding of lung field: Secondary | ICD-10-CM | POA: Insufficient documentation

## 2017-09-23 DIAGNOSIS — Z87891 Personal history of nicotine dependence: Secondary | ICD-10-CM

## 2017-09-23 DIAGNOSIS — C184 Malignant neoplasm of transverse colon: Secondary | ICD-10-CM

## 2017-09-23 DIAGNOSIS — C187 Malignant neoplasm of sigmoid colon: Secondary | ICD-10-CM

## 2017-09-23 DIAGNOSIS — Z86718 Personal history of other venous thrombosis and embolism: Secondary | ICD-10-CM | POA: Diagnosis not present

## 2017-09-23 DIAGNOSIS — Z85038 Personal history of other malignant neoplasm of large intestine: Secondary | ICD-10-CM | POA: Insufficient documentation

## 2017-09-23 LAB — CEA (IN HOUSE-CHCC): CEA (CHCC-In House): 1.49 ng/mL (ref 0.00–5.00)

## 2017-09-23 NOTE — Progress Notes (Signed)
  [place] OFFICE PROGRESS NOTE   Diagnosis: Colon cancer  INTERVAL HISTORY:   [name] returns as scheduled.  She reports feeling well.  She has returned to work.  She attributes weight loss to exercise and work.  No difficulty with bowel function.  No bleeding.  She reports recent headaches.  She relates this to living in a home with "mold ".  The headaches have improved since she relocated.  Objective:  Vital signs in last 24 hours:  Blood pressure (!) 161/65, pulse 65, temperature 98.1 F (36.7 C), temperature source Oral, resp. rate 15, weight 156 lb (70.8 kg), SpO2 100 %.    HEENT: Neck without mass Lymphatics: No cervical, supraclavicular, axillary, or inguinal nodes Resp: Scattered end inspiratory wheeze/rhonchi at the upper posterior chest, no respiratory distress Cardio: Regular rate and rhythm GI: No hepatomegaly, nontender, no mass Vascular: No leg edema   Lab Results:    Lab Results  Component Value Date   CEA1 1.49 09/23/2017     Medications: I have reviewed the patient's current medications.   Assessment/Plan: 1. 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 Adjuvant FOLFOX chemotherapy was initiated on 01/21/2012. The last cycle was completed on 06/30/2012. Restaging CT scans 09/30/2012 showed no evidence of recurrent/metastatic disease. -Surveillance colonoscopy 02/24/2013 with a single hyperplastic polyp. Next colonoscopy recommended at a 3 year interval. -Surveillance CT scans 10/12/2013 with no evidence of metastatic disease  -Surveillance CT scans 10/14/2014 with no evidence of metastatic disease -Negative surveillance colonoscopy 03/30/2016  2. Delayed nausea following cycle 2 FOLFOX. Aloxi was added with cycle 3. Persistent delayed nausea. Emend was added with cycle 4. She noted improvement in the nausea following cycle 4. She declined steroid  prophylaxis.  3. Tiny lung nodules seen on a CT of the chest 10/19/2011. Stable on CT of the chest 10/12/2013 and 10/14/2014 4. History of tobacco use 5. Endometrial thickening on CT of the abdomen 10/02/2011. Progressive endometrial thickening on CT 09/30/2012. Status post removal of an endometrial polyp 02/03/2013 with benign pathology. 6. Port-A-Cath placement 01/09/2012 in interventional radiology. Removed 12/30/2012. 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 The finger numbness has improved.  8. Mild thrombocytopenia secondary to chemotherapy-resolved. 9. History of neutropenia secondary to chemotherapy. 10. 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   Disposition: [name] is in clinical remission from colon cancer.  She will return for an office visit in 6 months.  She request we check the "potassium "level when she is here in 6 months.  She currently does not have a primary physician.  We recommended she obtain a primary physician for blood pressure management and internal medicine care.  15 minutes were spent with the patient today.  The majority of the time was used for counseling and coordination of care.  Betsy Coder, MD  09/23/2017  12:06 PM

## 2017-09-23 NOTE — Telephone Encounter (Signed)
Appointments scheduled AVS/Calendar printed per 4/1 los °

## 2017-09-24 ENCOUNTER — Telehealth: Payer: Self-pay | Admitting: *Deleted

## 2017-09-24 NOTE — Telephone Encounter (Signed)
-----   Message from Ladell Pier, MD sent at 09/23/2017  2:16 PM EDT ----- Please call patient, CEA is normal, recommend she obtain a primary physician for management of hypertension and internal medicine care

## 2017-09-24 NOTE — Telephone Encounter (Signed)
Notified pt of normal CEA and MD recommendation that she obtain PCP. She voiced understanding.

## 2018-02-21 ENCOUNTER — Inpatient Hospital Stay: Payer: BC Managed Care – PPO | Attending: Oncology

## 2018-02-21 DIAGNOSIS — C187 Malignant neoplasm of sigmoid colon: Secondary | ICD-10-CM

## 2018-02-21 DIAGNOSIS — C189 Malignant neoplasm of colon, unspecified: Secondary | ICD-10-CM | POA: Insufficient documentation

## 2018-02-21 LAB — BASIC METABOLIC PANEL - CANCER CENTER ONLY
Anion gap: 9 (ref 5–15)
BUN: 11 mg/dL (ref 8–23)
CHLORIDE: 108 mmol/L (ref 98–111)
CO2: 28 mmol/L (ref 22–32)
Calcium: 9.6 mg/dL (ref 8.9–10.3)
Creatinine: 0.78 mg/dL (ref 0.44–1.00)
GFR, Est AFR Am: >60 mL/min (ref >60)
GFR, Estimated: >60 mL/min (ref >60)
GLUCOSE: 98 mg/dL (ref 70–99)
Potassium: 3.4 mmol/L — ABNORMAL LOW (ref 3.5–5.1)
SODIUM: 145 mmol/L (ref 135–145)

## 2018-02-25 ENCOUNTER — Other Ambulatory Visit: Payer: BC Managed Care – PPO

## 2018-02-25 ENCOUNTER — Telehealth: Payer: Self-pay | Admitting: Oncology

## 2018-02-25 ENCOUNTER — Inpatient Hospital Stay: Payer: BC Managed Care – PPO | Attending: Nurse Practitioner | Admitting: Nurse Practitioner

## 2018-02-25 ENCOUNTER — Encounter: Payer: Self-pay | Admitting: Nurse Practitioner

## 2018-02-25 VITALS — BP 170/84 | HR 63 | Temp 98.0°F | Resp 18 | Ht 65.0 in | Wt 160.3 lb

## 2018-02-25 DIAGNOSIS — Z9049 Acquired absence of other specified parts of digestive tract: Secondary | ICD-10-CM | POA: Insufficient documentation

## 2018-02-25 DIAGNOSIS — Z87891 Personal history of nicotine dependence: Secondary | ICD-10-CM

## 2018-02-25 DIAGNOSIS — C189 Malignant neoplasm of colon, unspecified: Secondary | ICD-10-CM

## 2018-02-25 DIAGNOSIS — Z9221 Personal history of antineoplastic chemotherapy: Secondary | ICD-10-CM | POA: Insufficient documentation

## 2018-02-25 DIAGNOSIS — Z85038 Personal history of other malignant neoplasm of large intestine: Secondary | ICD-10-CM | POA: Diagnosis not present

## 2018-02-25 DIAGNOSIS — Z86718 Personal history of other venous thrombosis and embolism: Secondary | ICD-10-CM

## 2018-02-25 NOTE — Telephone Encounter (Signed)
Appts scheduled AVS/ Calendar printed per 9/3 los °

## 2018-02-25 NOTE — Progress Notes (Signed)
  [facility] OFFICE PROGRESS NOTE   Diagnosis:  Colon cancer  INTERVAL HISTORY:   [name] returns as scheduled.  She feels well.  No change in bowel habits.  No abdominal pain.  No bloody or black stools.  She has a good appetite.  She reports her weight is stable.  Objective:  Vital signs in last 24 hours:  Blood pressure (!) 170/84, pulse 63, temperature 98 F (36.7 C), temperature source Oral, resp. rate 18, height 5\' 5"  (1.651 m), weight 160 lb 4.8 oz (72.7 kg), SpO2 100 %.    HEENT: Neck without mass. Lymphatics: No palpable cervical, supraclavicular, axillary or inguinal lymph nodes. Resp: Lungs clear bilaterally. Cardio: Regular rate and rhythm. GI: Abdomen soft and nontender.  No hepatomegaly.  No mass. Vascular: No leg edema.   Lab Results:  Lab Results  Component Value Date   WBC 7.4 02/11/2017   HGB 11.6 (L) 02/11/2017   HCT 35.1 (L) 02/11/2017   MCV 90.0 02/11/2017   PLT 262 02/11/2017   NEUTROABS 3.7 12/16/2015    Imaging:  No results found.  Medications: I have reviewed the patient's current medications.  Assessment/Plan: 1. 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 Adjuvant FOLFOX chemotherapy was initiated on 01/21/2012. The last cycle was completed on 06/30/2012. Restaging CT scans 09/30/2012 showed no evidence of recurrent/metastatic disease. -Surveillance colonoscopy 02/24/2013 with a single hyperplastic polyp. Next colonoscopy recommended at a 3 year interval. -Surveillance CT scans 10/12/2013 with no evidence of metastatic disease  -Surveillance CT scans 10/14/2014 with no evidence of metastatic disease -Negative surveillance colonoscopy 03/30/2016 2. Delayed nausea following cycle 2 FOLFOX. Aloxi was added with cycle 3. Persistent delayed nausea. Emend was added with cycle 4. She noted improvement in the nausea following cycle 4. She declined  steroid prophylaxis.  3. Tiny lung nodules seen on a CT of the chest 10/19/2011. Stable on CT of the chest 10/12/2013 and 10/14/2014 4. History of tobacco use 5. Endometrial thickening on CT of the abdomen 10/02/2011. Progressive endometrial thickening on CT 09/30/2012. Status post removal of an endometrial polyp 02/03/2013 with benign pathology. 6. Port-A-Cath placement 01/09/2012 in interventional radiology. Removed 12/30/2012. 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 The finger numbness has improved.  8. Mild thrombocytopenia secondary to chemotherapy-resolved. 9. History of neutropenia secondary to chemotherapy. 10. 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   Disposition: [name] remains in clinical remission from colon cancer.  She is now greater than 6 years out from diagnosis.  She will return for a CEA and follow-up visit in 6 months.  She does not have a PCP.  We made a referral to establish with a PCP at today's visit.  Plan reviewed with Dr. Benay Spice.    Ned Card ANP/GNP-BC   02/25/2018  9:42 AM

## 2018-04-11 ENCOUNTER — Telehealth: Payer: Self-pay | Admitting: *Deleted

## 2018-04-11 ENCOUNTER — Telehealth: Payer: Self-pay | Admitting: Oncology

## 2018-04-11 NOTE — Telephone Encounter (Signed)
Appt scheduled patient notified per 10/18 sch msg °

## 2018-04-11 NOTE — Telephone Encounter (Signed)
Patient called to schedule a flu shot. Message sent to scheduling.

## 2018-04-14 ENCOUNTER — Inpatient Hospital Stay: Payer: BC Managed Care – PPO | Attending: Oncology

## 2018-04-14 DIAGNOSIS — Z23 Encounter for immunization: Secondary | ICD-10-CM | POA: Diagnosis not present

## 2018-04-14 DIAGNOSIS — Z85038 Personal history of other malignant neoplasm of large intestine: Secondary | ICD-10-CM | POA: Diagnosis present

## 2018-04-14 MED ORDER — INFLUENZA VAC SPLIT QUAD 0.5 ML IM SUSY
0.5000 mL | PREFILLED_SYRINGE | Freq: Once | INTRAMUSCULAR | Status: AC
  Administered 2018-04-14: 0.5 mL via INTRAMUSCULAR

## 2018-04-14 NOTE — Patient Instructions (Signed)

## 2018-04-28 ENCOUNTER — Encounter (HOSPITAL_COMMUNITY): Payer: Self-pay | Admitting: Emergency Medicine

## 2018-04-28 ENCOUNTER — Emergency Department (HOSPITAL_COMMUNITY): Payer: BC Managed Care – PPO

## 2018-04-28 ENCOUNTER — Emergency Department (HOSPITAL_COMMUNITY)
Admission: EM | Admit: 2018-04-28 | Discharge: 2018-04-28 | Disposition: A | Discharge status: Home / Self Care | Payer: BC Managed Care – PPO | Attending: Emergency Medicine | Admitting: Emergency Medicine

## 2018-04-28 DIAGNOSIS — Y998 Other external cause status: Secondary | ICD-10-CM | POA: Diagnosis not present

## 2018-04-28 DIAGNOSIS — Y92410 Unspecified street and highway as the place of occurrence of the external cause: Secondary | ICD-10-CM | POA: Diagnosis not present

## 2018-04-28 DIAGNOSIS — S161XXA Strain of muscle, fascia and tendon at neck level, initial encounter: Secondary | ICD-10-CM | POA: Insufficient documentation

## 2018-04-28 DIAGNOSIS — S199XXA Unspecified injury of neck, initial encounter: Secondary | ICD-10-CM | POA: Diagnosis present

## 2018-04-28 DIAGNOSIS — F17210 Nicotine dependence, cigarettes, uncomplicated: Secondary | ICD-10-CM | POA: Insufficient documentation

## 2018-04-28 DIAGNOSIS — Z79899 Other long term (current) drug therapy: Secondary | ICD-10-CM | POA: Insufficient documentation

## 2018-04-28 DIAGNOSIS — Y93I9 Activity, other involving external motion: Secondary | ICD-10-CM | POA: Diagnosis not present

## 2018-04-28 DIAGNOSIS — Z85038 Personal history of other malignant neoplasm of large intestine: Secondary | ICD-10-CM | POA: Insufficient documentation

## 2018-04-28 MED ORDER — ACETAMINOPHEN 325 MG PO TABS
650.0000 mg | ORAL_TABLET | Freq: Once | ORAL | Status: AC
  Administered 2018-04-28: 650 mg via ORAL
  Filled 2018-04-28: qty 2
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
## 2018-04-28 NOTE — Discharge Instructions (Signed)
Return if any problems.  See your Physician for recheck in 1 week   °
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
## 2018-04-30 ENCOUNTER — Ambulatory Visit: Payer: BC Managed Care – PPO | Admitting: Nurse Practitioner

## 2018-04-30 ENCOUNTER — Encounter: Payer: Self-pay | Admitting: Nurse Practitioner

## 2018-04-30 VITALS — BP 160/100 | HR 61 | Ht 66.0 in | Wt 158.0 lb

## 2018-04-30 DIAGNOSIS — R030 Elevated blood-pressure reading, without diagnosis of hypertension: Secondary | ICD-10-CM | POA: Diagnosis not present

## 2018-04-30 DIAGNOSIS — C189 Malignant neoplasm of colon, unspecified: Secondary | ICD-10-CM

## 2018-04-30 DIAGNOSIS — M542 Cervicalgia: Secondary | ICD-10-CM

## 2018-04-30 NOTE — Patient Instructions (Addendum)
Please try to check your blood pressure once daily or at least a few times a week, at the same time each day, and keep a log. Please return in 1 month with your log Blood pressure goal is less than 140/90  Please schedule a follow up appointment for further evaluation here with Dr Tamala Julian or Dr Raeford Razor, our sports medicine providers, for your neck and back pain   Back Exercises If you have pain in your back, do these exercises 2-3 times each day or as told by your doctor. When the pain goes away, do the exercises once each day, but repeat the steps more times for each exercise (do more repetitions). If you do not have pain in your back, do these exercises once each day or as told by your doctor. Exercises Single Knee to Chest  Do these steps 3-5 times in a row for each leg: 1. Lie on your back on a firm bed or the floor with your legs stretched out. 2. Bring one knee to your chest. 3. Hold your knee to your chest by grabbing your knee or thigh. 4. Pull on your knee until you feel a gentle stretch in your lower back. 5. Keep doing the stretch for 10-30 seconds. 6. Slowly let go of your leg and straighten it.  Pelvic Tilt  Do these steps 5-10 times in a row: 1. Lie on your back on a firm bed or the floor with your legs stretched out. 2. Bend your knees so they point up to the ceiling. Your feet should be flat on the floor. 3. Tighten your lower belly (abdomen) muscles to press your lower back against the floor. This will make your tailbone point up to the ceiling instead of pointing down to your feet or the floor. 4. Stay in this position for 5-10 seconds while you gently tighten your muscles and breathe evenly.  Cat-Cow  Do these steps until your lower back bends more easily: 1. Get on your hands and knees on a firm surface. Keep your hands under your shoulders, and keep your knees under your hips. You may put padding under your knees. 2. Let your head hang down, and make your tailbone  point down to the floor so your lower back is round like the back of a cat. 3. Stay in this position for 5 seconds. 4. Slowly lift your head and make your tailbone point up to the ceiling so your back hangs low (sags) like the back of a cow. 5. Stay in this position for 5 seconds.  Press-Ups  Do these steps 5-10 times in a row: 1. Lie on your belly (face-down) on the floor. 2. Place your hands near your head, about shoulder-width apart. 3. While you keep your back relaxed and keep your hips on the floor, slowly straighten your arms to raise the top half of your body and lift your shoulders. Do not use your back muscles. To make yourself more comfortable, you may change where you place your hands. 4. Stay in this position for 5 seconds. 5. Slowly return to lying flat on the floor.  Bridges  Do these steps 10 times in a row: 1. Lie on your back on a firm surface. 2. Bend your knees so they point up to the ceiling. Your feet should be flat on the floor. 3. Tighten your butt muscles and lift your butt off of the floor until your waist is almost as high as your knees. If you do not  feel the muscles working in your butt and the back of your thighs, slide your feet 1-2 inches farther away from your butt. 4. Stay in this position for 3-5 seconds. 5. Slowly lower your butt to the floor, and let your butt muscles relax.  If this exercise is too easy, try doing it with your arms crossed over your chest. Belly Crunches  Do these steps 5-10 times in a row: 1. Lie on your back on a firm bed or the floor with your legs stretched out. 2. Bend your knees so they point up to the ceiling. Your feet should be flat on the floor. 3. Cross your arms over your chest. 4. Tip your chin a little bit toward your chest but do not bend your neck. 5. Tighten your belly muscles and slowly raise your chest just enough to lift your shoulder blades a tiny bit off of the floor. 6. Slowly lower your chest and your head  to the floor.  Back Lifts Do these steps 5-10 times in a row: 1. Lie on your belly (face-down) with your arms at your sides, and rest your forehead on the floor. 2. Tighten the muscles in your legs and your butt. 3. Slowly lift your chest off of the floor while you keep your hips on the floor. Keep the back of your head in line with the curve in your back. Look at the floor while you do this. 4. Stay in this position for 3-5 seconds. 5. Slowly lower your chest and your face to the floor.  Contact a doctor if:  Your back pain gets a lot worse when you do an exercise.  Your back pain does not lessen 2 hours after you exercise. If you have any of these problems, stop doing the exercises. Do not do them again unless your doctor says it is okay. Get help right away if:  You have sudden, very bad back pain. If this happens, stop doing the exercises. Do not do them again unless your doctor says it is okay. This information is not intended to replace advice given to you by your health care provider. Make sure you discuss any questions you have with your health care provider. Document Released: 07/14/2010 Document Revised: 11/17/2015 Document Reviewed: 08/05/2014 Elsevier Interactive Patient Education  [employer].

## 2018-04-30 NOTE — Assessment & Plan Note (Signed)
Continue regular f/u with oncology as instructed
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
## 2018-05-09 ENCOUNTER — Encounter: Payer: Self-pay | Admitting: Family Medicine

## 2018-05-09 ENCOUNTER — Ambulatory Visit: Payer: BC Managed Care – PPO | Admitting: Family Medicine

## 2018-05-09 VITALS — BP 156/90 | HR 76 | Ht 66.0 in | Wt 158.0 lb

## 2018-05-09 DIAGNOSIS — M542 Cervicalgia: Secondary | ICD-10-CM | POA: Insufficient documentation

## 2018-05-09 MED ORDER — PREDNISONE 5 MG PO TABS: ORAL_TABLET | ORAL | 0 refills | Status: DC

## 2018-05-09 NOTE — Patient Instructions (Signed)
Nice to meet you  You will get a call to set up physical therapy  Please try the medicine  Please see me back in 3-4 weeks if your pain hasn't improved.
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
## 2018-05-30 ENCOUNTER — Ambulatory Visit: Payer: BC Managed Care – PPO | Admitting: Nurse Practitioner

## 2018-05-30 ENCOUNTER — Encounter: Payer: Self-pay | Admitting: Nurse Practitioner

## 2018-05-30 VITALS — BP 160/98 | HR 71 | Ht 66.0 in | Wt 160.0 lb

## 2018-05-30 DIAGNOSIS — I10 Essential (primary) hypertension: Secondary | ICD-10-CM

## 2018-05-30 MED ORDER — AMLODIPINE BESYLATE 5 MG PO TABS: 5.0000 mg | ORAL_TABLET | Freq: Every day | ORAL | 1 refills | Status: DC

## 2018-05-30 NOTE — Patient Instructions (Signed)
Start amlodipine 5 mg daily  Return in about 3-4 weeks for follow up   Hypertension Hypertension is another name for high blood pressure. High blood pressure forces your heart to work harder to pump blood. This can cause problems over time. There are two numbers in a blood pressure reading. There is a top number (systolic) over a bottom number (diastolic). It is best to have a blood pressure below 120/80. Healthy choices can help lower your blood pressure. You may need medicine to help lower your blood pressure if:  Your blood pressure cannot be lowered with healthy choices.  Your blood pressure is higher than 130/80.  Follow these instructions at home: Eating and drinking  If directed, follow the DASH eating plan. This diet includes: ? Filling half of your plate at each meal with fruits and vegetables. ? Filling one quarter of your plate at each meal with whole grains. Whole grains include whole wheat pasta, brown rice, and whole grain bread. ? Eating or drinking low-fat dairy products, such as skim milk or low-fat yogurt. ? Filling one quarter of your plate at each meal with low-fat (lean) proteins. Low-fat proteins include fish, skinless chicken, eggs, beans, and tofu. ? Avoiding fatty meat, cured and processed meat, or chicken with skin. ? Avoiding premade or processed food.  Eat less than 1,500 mg of salt (sodium) a day.  Limit alcohol use to no more than 1 drink a day for nonpregnant women and 2 drinks a day for men. One drink equals 12 oz of beer, 5 oz of wine, or 1 oz of hard liquor. Lifestyle  Work with your doctor to stay at a healthy weight or to lose weight. Ask your doctor what the best weight is for you.  Get at least 30 minutes of exercise that causes your heart to beat faster (aerobic exercise) most days of the week. This may include walking, swimming, or biking.  Get at least 30 minutes of exercise that strengthens your muscles (resistance exercise) at least 3 days  a week. This may include lifting weights or pilates.  Do not use any products that contain nicotine or tobacco. This includes cigarettes and e-cigarettes. If you need help quitting, ask your doctor.  Check your blood pressure at home as told by your doctor.  Keep all follow-up visits as told by your doctor. This is important. Medicines  Take over-the-counter and prescription medicines only as told by your doctor. Follow directions carefully.  Do not skip doses of blood pressure medicine. The medicine does not work as well if you skip doses. Skipping doses also puts you at risk for problems.  Ask your doctor about side effects or reactions to medicines that you should watch for. Contact a doctor if:  You think you are having a reaction to the medicine you are taking.  You have headaches that keep coming back (recurring).  You feel dizzy.  You have swelling in your ankles.  You have trouble with your vision. Get help right away if:  You get a very bad headache.  You start to feel confused.  You feel weak or numb.  You feel faint.  You get very bad pain in your: ? Chest. ? Belly (abdomen).  You throw up (vomit) more than once.  You have trouble breathing. Summary  Hypertension is another name for high blood pressure.  Making healthy choices can help lower blood pressure. If your blood pressure cannot be controlled with healthy choices, you may need to take  medicine. This information is not intended to replace advice given to you by your health care provider. Make sure you discuss any questions you have with your health care provider. Document Released: 11/28/2007 Document Revised: 05/09/2016 Document Reviewed: 05/09/2016 Elsevier Interactive Patient Education  [employer].

## 2018-05-30 NOTE — Assessment & Plan Note (Signed)
BP remains elevated in clinic and home readings, per vitals review there have been elevated bp readings documented as far back as 2016 Again today we Discussed BP goal <140/90, long term risks of elevated blood pressure including MI, CVA and medication was recommended She seems hesitant but says she will try medication Amlodipine sent-dosing, side effects discussed Home management, red flags and return precautions including when to seek immediate care discussed and printed on AVS - amLODipine (NORVASC) 5 MG tablet; Take 1 tablet (5 mg total) by mouth daily.  Dispense: 30 tablet; Refill: 1
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
## 2018-06-06 ENCOUNTER — Ambulatory Visit: Payer: BC Managed Care – PPO | Admitting: Family Medicine

## 2018-06-06 DIAGNOSIS — Z0289 Encounter for other administrative examinations: Secondary | ICD-10-CM

## 2018-06-06 NOTE — Progress Notes (Deleted)
MARIJOSE CURINGTON - 72 y.o. female MRN [id number]  Date of birth: Mar 04, 1946  SUBJECTIVE:  Including CC & ROS.  No chief complaint on file.   PIEPER KASIK is a 72 y.o. female that is  ***.  ***   Review of Systems  HISTORY: Past Medical, Surgical, Social, and Family History Reviewed & Updated per EMR.   Pertinent Historical Findings include:  Past Medical History:  Diagnosis Date  . Abdominal pain   . Allergy   . Cancer ([place]) 11/2011   cancer in colon  . Colon cancer ([place]) 12/18/2011  . DVT (deep venous thrombosis) ([place]) 05/2012   (R)IJ and subclavian, stopped Coumadin July 2014  . History of kidney stones   . Irregular heart rate    since child    Past Surgical History:  Procedure Laterality Date  . CHOLECYSTECTOMY    . COLON RESECTION  12/18/2011   Procedure: COLON RESECTION LAPAROSCOPIC;  Surgeon: Joyice Faster. Cornett, MD;  Location: [facility];  Service: General;  Laterality: N/A;  . COLON SURGERY    . COLONOSCOPY    . DILATATION & CURRETTAGE/HYSTEROSCOPY WITH RESECTOCOPE N/A 02/03/2013   Procedure: DILATATION & CURETTAGE/HYSTEROSCOPY, removal  of endometrial mass;  Surgeon: Sharene Butters, MD;  Location: [facility];  Service: Gynecology;  Laterality: N/A;  . goiter removal     . KIDNEY STONE SURGERY    . PORTACATH PLACEMENT  11/2011   12 treatments of chemo/ last treatment 2014  . portacath removal  12/30/12  . TONSILLECTOMY     as child    Allergies  Allergen Reactions  . Aspirin Nausea And Vomiting  . Advil [Ibuprofen] Palpitations  . Aleve [Naproxen Sodium] Palpitations  . Penicillins Rash    Family History  Problem Relation Age of Onset  . Breast cancer Mother   . Heart disease Mother   . Hypertension Other   . Colon polyps Neg Hx   . Esophageal cancer Neg Hx   . Stomach cancer Neg Hx   . Rectal cancer Neg Hx      Social History   Socioeconomic History  . Marital status: Widowed    Spouse name: Not on file  . Number of children: 4  . Years of  education: Not on file  . Highest education level: Not on file  Occupational History  . Occupation: Education officer, environmental: [employer]  Social Needs  . Financial resource strain: Not on file  . Food insecurity:    Worry: Not on file    Inability: Not on file  . Transportation needs:    Medical: Not on file    Non-medical: Not on file  Tobacco Use  . Smoking status: Current Some Day Smoker    Packs/day: 0.25    Years: 35.00    Pack years: 8.75    Types: Cigarettes  . Smokeless tobacco: Never Used  . Tobacco comment: smoke 2 cigarettes a day/occasional  Substance and Sexual Activity  . Alcohol use: No  . Drug use: No  . Sexual activity: Not on file  Lifestyle  . Physical activity:    Days per week: Not on file    Minutes per session: Not on file  . Stress: Not on file  Relationships  . Social connections:    Talks on phone: Not on file    Gets together: Not on file    Attends religious service: Not on file    Active member of club  or organization: Not on file    Attends meetings of clubs or organizations: Not on file    Relationship status: Not on file  . Intimate partner violence:    Fear of current or ex partner: Not on file    Emotionally abused: Not on file    Physically abused: Not on file    Forced sexual activity: Not on file  Other Topics Concern  . 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 Physical Exam Gen: NAD, alert, cooperative with exam, well-appearing ENT: normal lips, normal nasal mucosa,  Eye: normal EOM, normal conjunctiva and lids CV:  no edema, +2 pedal pulses   Resp: no accessory muscle use, non-labored,  GI: no  masses or tenderness, no hernia  Skin: no rashes, no areas of induration  Neuro: normal tone, normal sensation to touch Psych:  normal insight, alert and oriented MSK:  ***      ASSESSMENT & PLAN:   No problem-specific Assessment & Plan notes found for this encounter.

## 2018-07-02 ENCOUNTER — Ambulatory Visit: Payer: BC Managed Care – PPO | Admitting: Nurse Practitioner

## 2018-07-10 ENCOUNTER — Other Ambulatory Visit (INDEPENDENT_AMBULATORY_CARE_PROVIDER_SITE_OTHER): Payer: BC Managed Care – PPO

## 2018-07-10 ENCOUNTER — Ambulatory Visit: Payer: BC Managed Care – PPO | Admitting: Nurse Practitioner

## 2018-07-10 ENCOUNTER — Encounter: Payer: Self-pay | Admitting: Nurse Practitioner

## 2018-07-10 VITALS — BP 150/96 | HR 73 | Ht 66.0 in | Wt 156.0 lb

## 2018-07-10 DIAGNOSIS — Z9189 Other specified personal risk factors, not elsewhere classified: Secondary | ICD-10-CM

## 2018-07-10 DIAGNOSIS — I10 Essential (primary) hypertension: Secondary | ICD-10-CM

## 2018-07-10 DIAGNOSIS — Z0000 Encounter for general adult medical examination without abnormal findings: Secondary | ICD-10-CM

## 2018-07-10 DIAGNOSIS — Z13220 Encounter for screening for lipoid disorders: Secondary | ICD-10-CM | POA: Diagnosis not present

## 2018-07-10 DIAGNOSIS — Z1159 Encounter for screening for other viral diseases: Secondary | ICD-10-CM

## 2018-07-10 LAB — CBC
HCT: 38.7 % (ref 36.0–46.0)
Hemoglobin: 12.8 g/dL (ref 12.0–15.0)
MCHC: 33.2 g/dL (ref 30.0–36.0)
MCV: 90 fl (ref 78.0–100.0)
Platelets: 239 K/uL (ref 150.0–400.0)
RBC: 4.3 Mil/uL (ref 3.87–5.11)
RDW: 13.2 % (ref 11.5–15.5)
WBC: 6.6 K/uL (ref 4.0–10.5)

## 2018-07-10 LAB — COMPREHENSIVE METABOLIC PANEL WITH GFR
ALT: 7 U/L (ref 0–35)
AST: 13 U/L (ref 0–37)
Albumin: 4.1 g/dL (ref 3.5–5.2)
Alkaline Phosphatase: 86 U/L (ref 39–117)
BUN: 11 mg/dL (ref 6–23)
CO2: 30 meq/L (ref 19–32)
Calcium: 9.8 mg/dL (ref 8.4–10.5)
Chloride: 106 meq/L (ref 96–112)
Creatinine, Ser: 0.81 mg/dL (ref 0.40–1.20)
GFR: 89.83 mL/min
Glucose, Bld: 94 mg/dL (ref 70–99)
Potassium: 3.5 meq/L (ref 3.5–5.1)
Sodium: 142 meq/L (ref 135–145)
Total Bilirubin: 0.6 mg/dL (ref 0.2–1.2)
Total Protein: 7.3 g/dL (ref 6.0–8.3)

## 2018-07-10 LAB — LIPID PANEL
Cholesterol: 179 mg/dL (ref 0–200)
HDL: 57.4 mg/dL
LDL Cholesterol: 107 mg/dL — ABNORMAL HIGH (ref 0–99)
NonHDL: 121.81
Total CHOL/HDL Ratio: 3
Triglycerides: 75 mg/dL (ref 0.0–149.0)
VLDL: 15 mg/dL (ref 0.0–40.0)

## 2018-07-10 LAB — TSH: TSH: 1.93 u[IU]/mL (ref 0.35–4.50)

## 2018-07-10 NOTE — Assessment & Plan Note (Addendum)
BP remains elevated with reported medication noncompliance  Due to age we can allow for somewhat higher blood pressure but should try to get closer to 140/90 to prevent negative outcomes, MI, stroke,which we discussed again today She says she will begin taking the amlodipine daily She will continue to monitor BP readings Home management, diet and lifestyle, red flags and return precautions including when to seek immediate care discussed and printed on AVS RTC in 3 months for follow up to recheck BP - Lipid panel; Future - CBC; Future - Comprehensive metabolic panel; Future - TSH; Future

## 2018-07-10 NOTE — Patient Instructions (Addendum)
Head downstairs for labs today   Hypertension Hypertension, commonly called high blood pressure, is when the force of blood pumping through the arteries is too strong. The arteries are the blood vessels that carry blood from the heart throughout the body. Hypertension forces the heart to work harder to pump blood and may cause arteries to become narrow or stiff. Having untreated or uncontrolled hypertension can cause heart attacks, strokes, kidney disease, and other problems. A blood pressure reading consists of a higher number over a lower number. Ideally, your blood pressure should be below 120/80. The first ("top") number is called the systolic pressure. It is a measure of the pressure in your arteries as your heart beats. The second ("bottom") number is called the diastolic pressure. It is a measure of the pressure in your arteries as the heart relaxes. What are the causes? The cause of this condition is not known. What increases the risk? Some risk factors for high blood pressure are under your control. Others are not. Factors you can change  Smoking.  Having type 2 diabetes mellitus, high cholesterol, or both.  Not getting enough exercise or physical activity.  Being overweight.  Having too much fat, sugar, calories, or salt (sodium) in your diet.  Drinking too much alcohol. Factors that are difficult or impossible to change  Having chronic kidney disease.  Having a family history of high blood pressure.  Age. Risk increases with age.  Race. You may be at higher risk if you are African-American.  Gender. Men are at higher risk than women before age 70. After age 92, women are at higher risk than men.  Having obstructive sleep apnea.  Stress. What are the signs or symptoms? Extremely high blood pressure (hypertensive crisis) may cause:  Headache.  Anxiety.  Shortness of breath.  Nosebleed.  Nausea and vomiting.  Severe chest pain.  Jerky movements you cannot  control (seizures). How is this diagnosed? This condition is diagnosed by measuring your blood pressure while you are seated, with your arm resting on a surface. The cuff of the blood pressure monitor will be placed directly against the skin of your upper arm at the level of your heart. It should be measured at least twice using the same arm. Certain conditions can cause a difference in blood pressure between your right and left arms. Certain factors can cause blood pressure readings to be lower or higher than normal (elevated) for a short period of time:  When your blood pressure is higher when you are in a health care provider's office than when you are at home, this is called white coat hypertension. Most people with this condition do not need medicines.  When your blood pressure is higher at home than when you are in a health care provider's office, this is called masked hypertension. Most people with this condition may need medicines to control blood pressure. If you have a high blood pressure reading during one visit or you have normal blood pressure with other risk factors:  You may be asked to return on a different day to have your blood pressure checked again.  You may be asked to monitor your blood pressure at home for 1 week or longer. If you are diagnosed with hypertension, you may have other blood or imaging tests to help your health care provider understand your overall risk for other conditions. How is this treated? This condition is treated by making healthy lifestyle changes, such as eating healthy foods, exercising more, and reducing  your alcohol intake. Your health care provider may prescribe medicine if lifestyle changes are not enough to get your blood pressure under control, and if:  Your systolic blood pressure is above 130.  Your diastolic blood pressure is above 80. Your personal target blood pressure may vary depending on your medical conditions, your age, and other  factors. Follow these instructions at home: Eating and drinking   Eat a diet that is high in fiber and potassium, and low in sodium, added sugar, and fat. An example eating plan is called the DASH (Dietary Approaches to Stop Hypertension) diet. To eat this way: ? Eat plenty of fresh fruits and vegetables. Try to fill half of your plate at each meal with fruits and vegetables. ? Eat whole grains, such as whole wheat pasta, brown rice, or whole grain bread. Fill about one quarter of your plate with whole grains. ? Eat or drink low-fat dairy products, such as skim milk or low-fat yogurt. ? Avoid fatty cuts of meat, processed or cured meats, and poultry with skin. Fill about one quarter of your plate with lean proteins, such as fish, chicken without skin, beans, eggs, and tofu. ? Avoid premade and processed foods. These tend to be higher in sodium, added sugar, and fat.  Reduce your daily sodium intake. Most people with hypertension should eat less than 1,500 mg of sodium a day.  Limit alcohol intake to no more than 1 drink a day for nonpregnant women and 2 drinks a day for men. One drink equals 12 oz of beer, 5 oz of wine, or 1 oz of hard liquor. Lifestyle   Work with your health care provider to maintain a healthy body weight or to lose weight. Ask what an ideal weight is for you.  Get at least 30 minutes of exercise that causes your heart to beat faster (aerobic exercise) most days of the week. Activities may include walking, swimming, or biking.  Include exercise to strengthen your muscles (resistance exercise), such as pilates or lifting weights, as part of your weekly exercise routine. Try to do these types of exercises for 30 minutes at least 3 days a week.  Do not use any products that contain nicotine or tobacco, such as cigarettes and e-cigarettes. If you need help quitting, ask your health care provider.  Monitor your blood pressure at home as told by your health care  provider.  Keep all follow-up visits as told by your health care provider. This is important. Medicines  Take over-the-counter and prescription medicines only as told by your health care provider. Follow directions carefully. Blood pressure medicines must be taken as prescribed.  Do not skip doses of blood pressure medicine. Doing this puts you at risk for problems and can make the medicine less effective.  Ask your health care provider about side effects or reactions to medicines that you should watch for. Contact a health care provider if:  You think you are having a reaction to a medicine you are taking.  You have headaches that keep coming back (recurring).  You feel dizzy.  You have swelling in your ankles.  You have trouble with your vision. Get help right away if:  You develop a severe headache or confusion.  You have unusual weakness or numbness.  You feel faint.  You have severe pain in your chest or abdomen.  You vomit repeatedly.  You have trouble breathing. Summary  Hypertension is when the force of blood pumping through your arteries is too strong.  If this condition is not controlled, it may put you at risk for serious complications.  Your personal target blood pressure may vary depending on your medical conditions, your age, and other factors. For most people, a normal blood pressure is less than 120/80.  Hypertension is treated with lifestyle changes, medicines, or a combination of both. Lifestyle changes include weight loss, eating a healthy, low-sodium diet, exercising more, and limiting alcohol. This information is not intended to replace advice given to you by your health care provider. Make sure you discuss any questions you have with your health care provider. Document Released: 06/11/2005 Document Revised: 05/09/2016 Document Reviewed: 05/09/2016 Elsevier Interactive Patient Education  2019 [employer].
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
## 2018-07-11 LAB — HEPATITIS C ANTIBODY
Hepatitis C Ab: NONREACTIVE
SIGNAL TO CUT-OFF: 0.02 (ref <1.00)

## 2018-08-05 ENCOUNTER — Other Ambulatory Visit: Payer: Self-pay | Admitting: *Deleted

## 2018-08-05 DIAGNOSIS — I10 Essential (primary) hypertension: Secondary | ICD-10-CM

## 2018-08-05 MED ORDER — AMLODIPINE BESYLATE 5 MG PO TABS: 5.0000 mg | ORAL_TABLET | Freq: Every day | ORAL | 1 refills | Status: DC

## 2018-10-09 ENCOUNTER — Ambulatory Visit: Payer: BC Managed Care – PPO | Admitting: Nurse Practitioner

## 2018-10-31 ENCOUNTER — Ambulatory Visit: Payer: BC Managed Care – PPO | Admitting: Nurse Practitioner

## 2019-02-27 ENCOUNTER — Inpatient Hospital Stay: Payer: BC Managed Care – PPO | Admitting: Oncology

## 2019-02-27 ENCOUNTER — Telehealth: Payer: Self-pay | Admitting: *Deleted

## 2019-02-27 ENCOUNTER — Inpatient Hospital Stay: Payer: BC Managed Care – PPO | Attending: Oncology

## 2019-02-27 ENCOUNTER — Telehealth: Payer: Self-pay | Admitting: Oncology

## 2019-02-27 ENCOUNTER — Other Ambulatory Visit: Payer: Self-pay | Admitting: Oncology

## 2019-02-27 ENCOUNTER — Other Ambulatory Visit: Payer: Self-pay

## 2019-02-27 VITALS — BP 178/83 | HR 61 | Temp 97.3°F | Resp 18 | Ht 66.0 in | Wt 166.3 lb

## 2019-02-27 DIAGNOSIS — Z923 Personal history of irradiation: Secondary | ICD-10-CM | POA: Insufficient documentation

## 2019-02-27 DIAGNOSIS — R918 Other nonspecific abnormal finding of lung field: Secondary | ICD-10-CM | POA: Insufficient documentation

## 2019-02-27 DIAGNOSIS — Z87891 Personal history of nicotine dependence: Secondary | ICD-10-CM | POA: Insufficient documentation

## 2019-02-27 DIAGNOSIS — C189 Malignant neoplasm of colon, unspecified: Secondary | ICD-10-CM

## 2019-02-27 DIAGNOSIS — Z23 Encounter for immunization: Secondary | ICD-10-CM | POA: Diagnosis not present

## 2019-02-27 DIAGNOSIS — Z85038 Personal history of other malignant neoplasm of large intestine: Secondary | ICD-10-CM | POA: Insufficient documentation

## 2019-02-27 DIAGNOSIS — Z9221 Personal history of antineoplastic chemotherapy: Secondary | ICD-10-CM | POA: Diagnosis not present

## 2019-02-27 DIAGNOSIS — Z86718 Personal history of other venous thrombosis and embolism: Secondary | ICD-10-CM | POA: Insufficient documentation

## 2019-02-27 LAB — CEA (IN HOUSE-CHCC): CEA (CHCC-In House): 1.14 ng/mL (ref 0.00–5.00)

## 2019-02-27 NOTE — Telephone Encounter (Signed)
-----   Message from Ladell Pier, MD sent at 02/27/2019  3:08 PM EDT ----- Please call patient, cea is normal

## 2019-02-27 NOTE — Progress Notes (Signed)
  [name] OFFICE PROGRESS NOTE   Diagnosis: Colon cancer  INTERVAL HISTORY:   [name] returns as scheduled.  She feels well.  No complaints.  She is not taking blood pressure medication.  She no longer has a primary provider.  Objective:  Vital signs in last 24 hours:  Blood pressure (!) 178/83, pulse 61, temperature (!) 97.3 F (36.3 C), temperature source Temporal, resp. rate 18, height 5\' 6"  (1.676 m), weight 166 lb 4.8 oz (75.4 kg), SpO2 100 %.   Limited physical examination secondary to distancing COVID pandemic HEENT: Neck without mass Lymphatics: No cervical, supraclavicular, axillary, or inguinal nodes GI: No mass, nontender, no hepatosplenomegaly Vascular: No leg edema   Lab Results:  Lab Results  Component Value Date   WBC 6.6 07/10/2018   HGB 12.8 07/10/2018   HCT 38.7 07/10/2018   MCV 90.0 07/10/2018   PLT 239.0 07/10/2018   NEUTROABS 3.7 12/16/2015    CMP  Lab Results  Component Value Date   NA 142 07/10/2018   K 3.5 07/10/2018   CL 106 07/10/2018   CO2 30 07/10/2018   GLUCOSE 94 07/10/2018   BUN 11 07/10/2018   CREATININE 0.81 07/10/2018   CALCIUM 9.8 07/10/2018   PROT 7.3 07/10/2018   ALBUMIN 4.1 07/10/2018   AST 13 07/10/2018   ALT 7 07/10/2018   ALKPHOS 86 07/10/2018   BILITOT 0.6 07/10/2018   GFRNONAA >60 02/21/2018   GFRAA >60 02/21/2018    Lab Results  Component Value Date   CEA1 1.49 09/23/2017     Medications: I have reviewed the patient's current medications.   Assessment/Plan: 1. 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 Adjuvant FOLFOX chemotherapy was initiated on 01/21/2012. The last cycle was completed on 06/30/2012. Restaging CT scans 09/30/2012 showed no evidence of recurrent/metastatic disease. -Surveillance colonoscopy 02/24/2013 with a single hyperplastic polyp. Next colonoscopy recommended at a 3 year interval.  -Surveillance CT scans 10/12/2013 with no evidence of metastatic disease  -Surveillance CT scans 10/14/2014 with no evidence of metastatic disease -Negative surveillance colonoscopy 03/30/2016 2. Delayed nausea following cycle 2 FOLFOX. Aloxi was added with cycle 3. Persistent delayed nausea. Emend was added with cycle 4. She noted improvement in the nausea following cycle 4. She declined steroid prophylaxis.  3. Tiny lung nodules seen on a CT of the chest 10/19/2011. Stable on CT of the chest 10/12/2013 and 10/14/2014 4. History of tobacco use 5. Endometrial thickening on CT of the abdomen 10/02/2011. Progressive endometrial thickening on CT 09/30/2012. Status post removal of an endometrial polyp 02/03/2013 with benign pathology. 6. Port-A-Cath placement 01/09/2012 in interventional radiology. Removed 12/30/2012. 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 The finger numbness has improved.  8. Mild thrombocytopenia secondary to chemotherapy-resolved. 9. History of neutropenia secondary to chemotherapy. 10. 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     Disposition: [name] is in remission from colon cancer.  She will continue colonoscopy surveillance with Dr. Ardis Hughs.  I encouraged her to obtain a primary provider for management of hypertension and general medical care.  She requested influenza vaccine here.  She will return for the influenza vaccine in  2 weeks.  We will follow-up on the CEA from today.  She will return for office visit in 1 year.  Betsy Coder, MD  02/27/2019  9:19 AM

## 2019-02-27 NOTE — Telephone Encounter (Signed)
Gave avs and calendar ° °

## 2019-02-27 NOTE — Telephone Encounter (Signed)
Per Dr. Benay Spice, called pt regarding normal CEA 1.14 results. Left voice message regarding CEA and if there were any other concerns, to contact office.

## 2019-03-09 ENCOUNTER — Inpatient Hospital Stay: Payer: BC Managed Care – PPO

## 2019-03-09 ENCOUNTER — Other Ambulatory Visit: Payer: Self-pay

## 2019-03-09 DIAGNOSIS — Z23 Encounter for immunization: Secondary | ICD-10-CM

## 2019-03-09 DIAGNOSIS — Z85038 Personal history of other malignant neoplasm of large intestine: Secondary | ICD-10-CM | POA: Diagnosis not present

## 2019-03-09 MED ORDER — INFLUENZA VAC A&B SA ADJ QUAD 0.5 ML IM PRSY
PREFILLED_SYRINGE | INTRAMUSCULAR | Status: AC
  Filled 2019-03-09: qty 0.5

## 2019-03-09 MED ORDER — INFLUENZA VAC A&B SA ADJ QUAD 0.5 ML IM PRSY
0.5000 mL | PREFILLED_SYRINGE | Freq: Once | INTRAMUSCULAR | Status: AC
  Administered 2019-03-09: 0.5 mL via INTRAMUSCULAR

## 2019-03-09 MED ORDER — INFLUENZA VAC SPLIT QUAD 0.5 ML IM SUSY: 0.5000 mL | PREFILLED_SYRINGE | Freq: Once | INTRAMUSCULAR | Status: DC

## 2019-03-09 NOTE — Progress Notes (Signed)
BP was 180/90 spoke with MD ok to give per Dr Benay Spice.

## 2019-03-09 NOTE — Patient Instructions (Signed)
Influenza Virus Vaccine (Flucelvax) What is this medicine? INFLUENZA VIRUS VACCINE (in floo EN zuh VAHY ruhs vak SEEN) helps to reduce the risk of getting influenza also known as the flu. The vaccine only helps protect you against some strains of the flu. This medicine may be used for other purposes; ask your health care provider or pharmacist if you have questions. COMMON BRAND NAME(S): FLUCELVAX What should I tell my health care provider before I take this medicine? They need to know if you have any of these conditions:  bleeding disorder like hemophilia  fever or infection  Guillain-Barre syndrome or other neurological problems  immune system problems  infection with the human immunodeficiency virus (HIV) or AIDS  low blood platelet counts  multiple sclerosis  an unusual or allergic reaction to influenza virus vaccine, other medicines, foods, dyes or preservatives  pregnant or trying to get pregnant  breast-feeding How should I use this medicine? This vaccine is for injection into a muscle. It is given by a health care professional. A copy of Vaccine Information Statements will be given before each vaccination. Read this sheet carefully each time. The sheet may change frequently. Talk to your pediatrician regarding the use of this medicine in children. Special care may be needed. Overdosage: If you think you've taken too much of this medicine contact a poison control center or emergency room at once. Overdosage: If you think you have taken too much of this medicine contact a poison control center or emergency room at once. NOTE: This medicine is only for you. Do not share this medicine with others. What if I miss a dose? This does not apply. What may interact with this medicine?  chemotherapy or radiation therapy  medicines that lower your immune system like etanercept, anakinra, infliximab, and adalimumab  medicines that treat or prevent blood clots like  warfarin  phenytoin  steroid medicines like prednisone or cortisone  theophylline  vaccines This list may not describe all possible interactions. Give your health care provider a list of all the medicines, herbs, non-prescription drugs, or dietary supplements you use. Also tell them if you smoke, drink alcohol, or use illegal drugs. Some items may interact with your medicine. What should I watch for while using this medicine? Report any side effects that do not go away within 3 days to your doctor or health care professional. Call your health care provider if any unusual symptoms occur within 6 weeks of receiving this vaccine. You may still catch the flu, but the illness is not usually as bad. You cannot get the flu from the vaccine. The vaccine will not protect against colds or other illnesses that may cause fever. The vaccine is needed every year. What side effects may I notice from receiving this medicine? Side effects that you should report to your doctor or health care professional as soon as possible:  allergic reactions like skin rash, itching or hives, swelling of the face, lips, or tongue Side effects that usually do not require medical attention (Report these to your doctor or health care professional if they continue or are bothersome.):  fever  headache  muscle aches and pains  pain, tenderness, redness, or swelling at the injection site  tiredness This list may not describe all possible side effects. Call your doctor for medical advice about side effects. You may report side effects to FDA at 1-800-FDA-1088. Where should I keep my medicine? The vaccine will be given by a health care professional in a clinic, pharmacy, doctor's   office, or other health care setting. You will not be given vaccine doses to store at home. NOTE: This sheet is a summary. It may not cover all possible information. If you have questions about this medicine, talk to your doctor, pharmacist, or  health care provider.  2020 Elsevier/Gold Standard (2011-05-23 14:06:47)  

## 2019-04-10 ENCOUNTER — Emergency Department (HOSPITAL_COMMUNITY): Payer: No Typology Code available for payment source

## 2019-04-10 ENCOUNTER — Emergency Department (HOSPITAL_COMMUNITY)
Admission: EM | Admit: 2019-04-10 | Discharge: 2019-04-10 | Disposition: A | Discharge status: Home / Self Care | Payer: No Typology Code available for payment source | Attending: Emergency Medicine | Admitting: Emergency Medicine

## 2019-04-10 ENCOUNTER — Other Ambulatory Visit: Payer: Self-pay

## 2019-04-10 ENCOUNTER — Encounter (HOSPITAL_COMMUNITY): Payer: Self-pay

## 2019-04-10 DIAGNOSIS — T59811A Toxic effect of smoke, accidental (unintentional), initial encounter: Secondary | ICD-10-CM | POA: Diagnosis not present

## 2019-04-10 DIAGNOSIS — R0602 Shortness of breath: Secondary | ICD-10-CM | POA: Diagnosis present

## 2019-04-10 DIAGNOSIS — Z85038 Personal history of other malignant neoplasm of large intestine: Secondary | ICD-10-CM | POA: Diagnosis not present

## 2019-04-10 DIAGNOSIS — Z86718 Personal history of other venous thrombosis and embolism: Secondary | ICD-10-CM | POA: Diagnosis not present

## 2019-04-10 DIAGNOSIS — I10 Essential (primary) hypertension: Secondary | ICD-10-CM | POA: Insufficient documentation

## 2019-04-10 DIAGNOSIS — Z720 Tobacco use: Secondary | ICD-10-CM | POA: Insufficient documentation

## 2019-04-10 DIAGNOSIS — Z79899 Other long term (current) drug therapy: Secondary | ICD-10-CM | POA: Insufficient documentation

## 2019-04-10 LAB — POCT I-STAT 7, (LYTES, BLD GAS, ICA,H+H)
Acid-Base Excess: 2 mmol/L (ref 0.0–2.0)
Bicarbonate: 27.1 mmol/L (ref 20.0–28.0)
Calcium, Ion: 1.31 mmol/L (ref 1.15–1.40)
HCT: 35 % — ABNORMAL LOW (ref 36.0–46.0)
Hemoglobin: 11.9 g/dL — ABNORMAL LOW (ref 12.0–15.0)
O2 Saturation: 95 %
Patient temperature: 98.6
Potassium: 3.4 mmol/L — ABNORMAL LOW (ref 3.5–5.1)
Sodium: 142 mmol/L (ref 135–145)
TCO2: 28 mmol/L (ref 22–32)
pCO2 arterial: 42.2 mmHg (ref 32.0–48.0)
pH, Arterial: 7.416 (ref 7.350–7.450)
pO2, Arterial: 76 mmHg — ABNORMAL LOW (ref 83.0–108.0)

## 2019-04-10 LAB — CBC WITH DIFFERENTIAL/PLATELET
Abs Immature Granulocytes: 0.03 10*3/uL (ref 0.00–0.07)
Basophils Absolute: 0.1 10*3/uL (ref 0.0–0.1)
Basophils Relative: 1 %
Eosinophils Absolute: 0.2 10*3/uL (ref 0.0–0.5)
Eosinophils Relative: 3 %
HCT: 38.1 % (ref 36.0–46.0)
Hemoglobin: 12.6 g/dL (ref 12.0–15.0)
Immature Granulocytes: 0 %
Lymphocytes Relative: 46 %
Lymphs Abs: 3.5 10*3/uL (ref 0.7–4.0)
MCH: 30.7 pg (ref 26.0–34.0)
MCHC: 33.1 g/dL (ref 30.0–36.0)
MCV: 92.7 fL (ref 80.0–100.0)
Monocytes Absolute: 0.6 10*3/uL (ref 0.1–1.0)
Monocytes Relative: 8 %
Neutro Abs: 3.2 10*3/uL (ref 1.7–7.7)
Neutrophils Relative %: 42 %
Platelets: 228 10*3/uL (ref 150–400)
RBC: 4.11 MIL/uL (ref 3.87–5.11)
RDW: 12.6 % (ref 11.5–15.5)
WBC: 7.6 10*3/uL (ref 4.0–10.5)
nRBC: 0 % (ref 0.0–0.2)

## 2019-04-10 LAB — COMPREHENSIVE METABOLIC PANEL
ALT: 15 U/L (ref 0–44)
AST: 24 U/L (ref 15–41)
Albumin: 4.1 g/dL (ref 3.5–5.0)
Alkaline Phosphatase: 73 U/L (ref 38–126)
Anion gap: 10 (ref 5–15)
BUN: 12 mg/dL (ref 8–23)
CO2: 25 mmol/L (ref 22–32)
Calcium: 9.7 mg/dL (ref 8.9–10.3)
Chloride: 105 mmol/L (ref 98–111)
Creatinine, Ser: 1 mg/dL (ref 0.44–1.00)
GFR calc Af Amer: >60 mL/min (ref >60)
GFR calc non Af Amer: 57 mL/min — ABNORMAL LOW (ref >60)
Glucose, Bld: 106 mg/dL — ABNORMAL HIGH (ref 70–99)
Potassium: 3.3 mmol/L — ABNORMAL LOW (ref 3.5–5.1)
Sodium: 140 mmol/L (ref 135–145)
Total Bilirubin: 0.5 mg/dL (ref 0.3–1.2)
Total Protein: 7.3 g/dL (ref 6.5–8.1)

## 2019-04-10 LAB — COOXEMETRY PANEL
Carboxyhemoglobin: 2.5 % — ABNORMAL HIGH (ref 0.5–1.5)
Methemoglobin: 0.7 % (ref 0.0–1.5)
O2 Saturation: 99.2 %
Total hemoglobin: 11.8 g/dL — ABNORMAL LOW (ref 12.0–16.0)

## 2019-04-10 LAB — LACTIC ACID, PLASMA: Lactic Acid, Venous: 1.2 mmol/L (ref 0.5–1.9)
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
## 2019-08-06 ENCOUNTER — Ambulatory Visit: Payer: BC Managed Care – PPO | Attending: Family

## 2019-08-06 DIAGNOSIS — Z23 Encounter for immunization: Secondary | ICD-10-CM | POA: Insufficient documentation

## 2019-08-06 NOTE — Progress Notes (Signed)
   Covid-19 Vaccination Clinic  Name:  [name]    MRN: [id number] DOB: Aug 25, 1945  08/06/2019  [name] was observed post Covid-19 immunization for 15 minutes without incidence. She was provided with Vaccine Information Sheet and instruction to access the V-Safe system.   [name] was instructed to call 911 with any severe reactions post vaccine: Marland Kitchen Difficulty breathing  . Swelling of your face and throat  . A fast heartbeat  . A bad rash all over your body  . Dizziness and weakness

## 2019-09-08 ENCOUNTER — Ambulatory Visit: Payer: BC Managed Care – PPO | Attending: Family

## 2019-09-08 DIAGNOSIS — Z23 Encounter for immunization: Secondary | ICD-10-CM

## 2019-09-08 NOTE — Progress Notes (Signed)
   Covid-19 Vaccination Clinic  Name:  [name]    MRN: [id number] DOB: 1945/11/12  09/08/2019  [name] was observed post Covid-19 immunization for 15 minutes without incident. She was provided with Vaccine Information Sheet and instruction to access the V-Safe system.   [name] was instructed to call 911 with any severe reactions post vaccine: Marland Kitchen Difficulty breathing  . Swelling of face and throat  . A fast heartbeat  . A bad rash all over body  . Dizziness and weakness   Immunizations Administered    Name Date Dose VIS Date Route   Moderna COVID-19 Vaccine 09/08/2019 10:36 AM 0.5 mL 05/26/2019 Intramuscular   Manufacturer: Moderna   Lot: [id number]   [id number]

## 2020-02-26 ENCOUNTER — Other Ambulatory Visit: Payer: Self-pay | Admitting: *Deleted

## 2020-02-26 DIAGNOSIS — C189 Malignant neoplasm of colon, unspecified: Secondary | ICD-10-CM

## 2020-03-01 ENCOUNTER — Inpatient Hospital Stay: Payer: BC Managed Care – PPO | Attending: Oncology | Admitting: Oncology

## 2020-03-01 ENCOUNTER — Telehealth: Payer: Self-pay | Admitting: Oncology

## 2020-03-01 ENCOUNTER — Other Ambulatory Visit: Payer: Self-pay

## 2020-03-01 ENCOUNTER — Inpatient Hospital Stay: Payer: BC Managed Care – PPO

## 2020-03-01 VITALS — BP 152/79 | HR 58 | Temp 97.5°F | Resp 20 | Ht 66.0 in | Wt 160.2 lb

## 2020-03-01 DIAGNOSIS — C189 Malignant neoplasm of colon, unspecified: Secondary | ICD-10-CM

## 2020-03-01 DIAGNOSIS — Z85038 Personal history of other malignant neoplasm of large intestine: Secondary | ICD-10-CM | POA: Insufficient documentation

## 2020-03-01 DIAGNOSIS — Z86718 Personal history of other venous thrombosis and embolism: Secondary | ICD-10-CM | POA: Insufficient documentation

## 2020-03-01 DIAGNOSIS — Z87891 Personal history of nicotine dependence: Secondary | ICD-10-CM | POA: Insufficient documentation

## 2020-03-01 DIAGNOSIS — Z9221 Personal history of antineoplastic chemotherapy: Secondary | ICD-10-CM | POA: Insufficient documentation

## 2020-03-01 DIAGNOSIS — Z23 Encounter for immunization: Secondary | ICD-10-CM | POA: Insufficient documentation

## 2020-03-01 LAB — CEA (IN HOUSE-CHCC): CEA (CHCC-In House): 1.07 ng/mL (ref 0.00–5.00)

## 2020-03-01 NOTE — Progress Notes (Signed)
  [facility] OFFICE PROGRESS NOTE   Diagnosis: Colon cancer  INTERVAL HISTORY:   [name] returns as scheduled.  She feels well.  No difficulty with bowel function.  Good appetite.  She has occasional abdominal pain after eating certain foods.  She continues to smoke occasionally.  Objective:  Vital signs in last 24 hours:  Blood pressure (!) 152/79, pulse (!) 58, temperature (!) 97.5 F (36.4 C), temperature source Tympanic, resp. rate 20, height 5\' 6"  (1.676 m), weight 160 lb 3.2 oz (72.7 kg), SpO2 99 %.     Lymphatics: No cervical, supraclavicular, axillary, or inguinal nodes Resp: Lungs with scattered bilateral end inspiratory rhonchi, no respiratory distress Cardio: Regular rate and rhythm GI: No hepatosplenomegaly, no mass, nontender Vascular: No leg edema      Lab Results:    Lab Results  Component Value Date   CEA1 1.07 03/01/2020     Medications: I have reviewed the patient's current medications.   Assessment/Plan: 1. 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 Adjuvant FOLFOX chemotherapy was initiated on 01/21/2012. The last cycle was completed on 06/30/2012. Restaging CT scans 09/30/2012 showed no evidence of recurrent/metastatic disease. -Surveillance colonoscopy 02/24/2013 with a single hyperplastic polyp. Next colonoscopy recommended at a 3 year interval. -Surveillance CT scans 10/12/2013 with no evidence of metastatic disease  -Surveillance CT scans 10/14/2014 with no evidence of metastatic disease -Negative surveillance colonoscopy 03/30/2016 2. Delayed nausea following cycle 2 FOLFOX. Aloxi was added with cycle 3. Persistent delayed nausea. Emend was added with cycle 4. She noted improvement in the nausea following cycle 4. She declined steroid prophylaxis.  3. Tiny lung nodules seen on a CT of the chest 10/19/2011. Stable on CT of the chest 10/12/2013  and 10/14/2014 4. History of tobacco use 5. Endometrial thickening on CT of the abdomen 10/02/2011. Progressive endometrial thickening on CT 09/30/2012. Status post removal of an endometrial polyp 02/03/2013 with benign pathology. 6. Port-A-Cath placement 01/09/2012 in interventional radiology. Removed 12/30/2012. 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 The finger numbness has improved.  8. Mild thrombocytopenia secondary to chemotherapy-resolved. 9. History of neutropenia secondary to chemotherapy. 10. 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  Disposition: [name] is in remission from colon cancer.  She would like to continue follow-up at the Cancer center.  She will return for an office visit in 1 year.  She is due for a surveillance colonoscopy in 1 year.  I encouraged her to follow-up with a primary provider.  I encouraged her to discontinue smoking.  She will return for an influenza vaccine in 2 weeks.  Betsy Coder, MD  03/01/2020  9:40 AM

## 2020-03-01 NOTE — Telephone Encounter (Signed)
Scheduled appointments per 9/7 los. Gave patient calendar print out.  

## 2020-03-15 ENCOUNTER — Other Ambulatory Visit: Payer: Self-pay | Admitting: *Deleted

## 2020-03-15 ENCOUNTER — Inpatient Hospital Stay: Payer: BC Managed Care – PPO

## 2020-03-15 ENCOUNTER — Other Ambulatory Visit: Payer: Self-pay

## 2020-03-15 DIAGNOSIS — Z85038 Personal history of other malignant neoplasm of large intestine: Secondary | ICD-10-CM | POA: Diagnosis not present

## 2020-03-15 DIAGNOSIS — Z23 Encounter for immunization: Secondary | ICD-10-CM

## 2020-03-15 MED ORDER — INFLUENZA VAC A&B SA ADJ QUAD 0.5 ML IM PRSY
PREFILLED_SYRINGE | INTRAMUSCULAR | Status: AC
  Filled 2020-03-15: qty 0.5

## 2020-03-15 MED ORDER — INFLUENZA VAC A&B SA ADJ QUAD 0.5 ML IM PRSY: 0.5000 mL | PREFILLED_SYRINGE | Freq: Once | INTRAMUSCULAR | Status: DC

## 2020-03-15 MED ORDER — INFLUENZA VAC A&B SA ADJ QUAD 0.5 ML IM PRSY
0.5000 mL | PREFILLED_SYRINGE | Freq: Once | INTRAMUSCULAR | Status: AC
  Administered 2020-03-15: 0.5 mL via INTRAMUSCULAR

## 2020-03-15 NOTE — Patient Instructions (Signed)
Influenza Virus Vaccine injection What is this medicine? INFLUENZA VIRUS VACCINE (in floo EN zuh VAHY ruhs vak SEEN) helps to reduce the risk of getting influenza also known as the flu. The vaccine only helps protect you against some strains of the flu. This medicine may be used for other purposes; ask your health care provider or pharmacist if you have questions. COMMON BRAND NAME(S): Afluria, Afluria Quadrivalent, Agriflu, Alfuria, FLUAD, Fluarix, Fluarix Quadrivalent, Flublok, Flublok Quadrivalent, FLUCELVAX, FLUCELVAX Quadrivalent, Flulaval, Flulaval Quadrivalent, Fluvirin, Fluzone, Fluzone High-Dose, Fluzone Intradermal, Fluzone Quadrivalent What should I tell my health care provider before I take this medicine? They need to know if you have any of these conditions:  bleeding disorder like hemophilia  fever or infection  Guillain-Barre syndrome or other neurological problems  immune system problems  infection with the human immunodeficiency virus (HIV) or AIDS  low blood platelet counts  multiple sclerosis  an unusual or allergic reaction to influenza virus vaccine, latex, other medicines, foods, dyes, or preservatives. Different brands of vaccines contain different allergens. Some may contain latex or eggs. Talk to your doctor about your allergies to make sure that you get the right vaccine.  pregnant or trying to get pregnant  breast-feeding How should I use this medicine? This vaccine is for injection into a muscle or under the skin. It is given by a health care professional. A copy of Vaccine Information Statements will be given before each vaccination. Read this sheet carefully each time. The sheet may change frequently. Talk to your healthcare provider to see which vaccines are right for you. Some vaccines should not be used in all age groups. Overdosage: If you think you have taken too much of this medicine contact a poison control center or emergency room at once. NOTE:  This medicine is only for you. Do not share this medicine with others. What if I miss a dose? This does not apply. What may interact with this medicine?  chemotherapy or radiation therapy  medicines that lower your immune system like etanercept, anakinra, infliximab, and adalimumab  medicines that treat or prevent blood clots like warfarin  phenytoin  steroid medicines like prednisone or cortisone  theophylline  vaccines This list may not describe all possible interactions. Give your health care provider a list of all the medicines, herbs, non-prescription drugs, or dietary supplements you use. Also tell them if you smoke, drink alcohol, or use illegal drugs. Some items may interact with your medicine. What should I watch for while using this medicine? Report any side effects that do not go away within 3 days to your doctor or health care professional. Call your health care provider if any unusual symptoms occur within 6 weeks of receiving this vaccine. You may still catch the flu, but the illness is not usually as bad. You cannot get the flu from the vaccine. The vaccine will not protect against colds or other illnesses that may cause fever. The vaccine is needed every year. What side effects may I notice from receiving this medicine? Side effects that you should report to your doctor or health care professional as soon as possible:  allergic reactions like skin rash, itching or hives, swelling of the face, lips, or tongue Side effects that usually do not require medical attention (report to your doctor or health care professional if they continue or are bothersome):  fever  headache  muscle aches and pains  pain, tenderness, redness, or swelling at the injection site  tiredness This list may not describe  all possible side effects. Call your doctor for medical advice about side effects. You may report side effects to FDA at 1-800-FDA-1088. Where should I keep my medicine? The  vaccine will be given by a health care professional in a clinic, pharmacy, doctor's office, or other health care setting. You will not be given vaccine doses to store at home. NOTE: This sheet is a summary. It may not cover all possible information. If you have questions about this medicine, talk to your doctor, pharmacist, or health care provider.  2020 Elsevier/Gold Standard (2018-05-06 08:45:43)

## 2020-03-15 NOTE — Progress Notes (Signed)
Ok to give injection with current BP. Patient states she get high BP when at the doctors.

## 2021-02-28 ENCOUNTER — Inpatient Hospital Stay: Payer: BC Managed Care – PPO | Attending: Oncology | Admitting: Oncology

## 2021-02-28 ENCOUNTER — Other Ambulatory Visit: Payer: Self-pay | Admitting: *Deleted

## 2021-02-28 ENCOUNTER — Other Ambulatory Visit: Payer: Self-pay

## 2021-02-28 VITALS — BP 160/80 | HR 76 | Temp 98.7°F | Resp 18 | Ht 66.0 in | Wt 156.2 lb

## 2021-02-28 DIAGNOSIS — F17210 Nicotine dependence, cigarettes, uncomplicated: Secondary | ICD-10-CM | POA: Diagnosis not present

## 2021-02-28 DIAGNOSIS — R630 Anorexia: Secondary | ICD-10-CM | POA: Insufficient documentation

## 2021-02-28 DIAGNOSIS — Z86718 Personal history of other venous thrombosis and embolism: Secondary | ICD-10-CM | POA: Diagnosis not present

## 2021-02-28 DIAGNOSIS — Z85038 Personal history of other malignant neoplasm of large intestine: Secondary | ICD-10-CM | POA: Insufficient documentation

## 2021-02-28 DIAGNOSIS — G620 Drug-induced polyneuropathy: Secondary | ICD-10-CM | POA: Diagnosis not present

## 2021-02-28 DIAGNOSIS — C189 Malignant neoplasm of colon, unspecified: Secondary | ICD-10-CM | POA: Diagnosis not present

## 2021-02-28 DIAGNOSIS — Z87891 Personal history of nicotine dependence: Secondary | ICD-10-CM

## 2021-02-28 DIAGNOSIS — R110 Nausea: Secondary | ICD-10-CM | POA: Insufficient documentation

## 2021-02-28 NOTE — Progress Notes (Signed)
[facility] OFFICE PROGRESS NOTE   Diagnosis: Colon cancer  INTERVAL HISTORY:   Ms. Aloia returns as scheduled.  She feels well.  She has a chronic poor appetite.  She has noted an enlarging lesion at the palate.  She continues smoking.  She has not established with a primary provider.  Objective:  Vital signs in last 24 hours:  Blood pressure (!) 174/80, pulse 76, temperature 98.7 F (37.1 C), temperature source Oral, resp. rate 18, height '5\' 6"'$  (1.676 m), weight 156 lb 3.2 oz (70.9 kg), SpO2 99 %.    HEENT: Symmetric raised lesion measuring 2-3 cm at the central palate Lymphatics: No cervical, supraclavicular, axillary, or inguinal nodes Resp: Lungs clear bilaterally Cardio: Regular rate and rhythm GI: No hepatosplenomegaly, nontender, no mass Vascular: No leg edema   Lab Results:  Lab Results  Component Value Date   WBC 7.6 04/10/2019   HGB 11.9 (L) 04/10/2019   HCT 35.0 (L) 04/10/2019   MCV 92.7 04/10/2019   PLT 228 04/10/2019   NEUTROABS 3.2 04/10/2019    CMP  Lab Results  Component Value Date   NA 142 04/10/2019   K 3.4 (L) 04/10/2019   CL 105 04/10/2019   CO2 25 04/10/2019   GLUCOSE 106 (H) 04/10/2019   BUN 12 04/10/2019   CREATININE 1.00 04/10/2019   CALCIUM 9.7 04/10/2019   PROT 7.3 04/10/2019   ALBUMIN 4.1 04/10/2019   AST 24 04/10/2019   ALT 15 04/10/2019   ALKPHOS 73 04/10/2019   BILITOT 0.5 04/10/2019   GFRNONAA 57 (L) 04/10/2019   GFRAA >60 04/10/2019    Lab Results  Component Value Date   CEA1 1.07 03/01/2020   CEA 0.8 10/13/2015    Medications: I have reviewed the patient's current medications.   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 Adjuvant FOLFOX chemotherapy was initiated on 01/21/2012. The last cycle was completed on 06/30/2012. Restaging CT scans 09/30/2012 showed no evidence of recurrent/metastatic  disease. -Surveillance colonoscopy 02/24/2013 with a single hyperplastic polyp. Next colonoscopy recommended at a 3 year interval.  -Surveillance CT scans 10/12/2013 with no evidence of metastatic disease   -Surveillance CT scans 10/14/2014 with no evidence of metastatic disease -Negative surveillance colonoscopy 03/30/2016  Delayed nausea following cycle 2 FOLFOX. Aloxi was added with cycle 3. Persistent delayed nausea. Emend was added with cycle 4. She noted improvement in the nausea following cycle 4. She declined steroid prophylaxis.   Tiny lung nodules seen on a CT of the chest 10/19/2011. Stable on CT of the chest 10/12/2013 and 10/14/2014 History of tobacco use Endometrial thickening on CT of the abdomen 10/02/2011. Progressive endometrial thickening on CT 09/30/2012. Status post removal of an endometrial polyp 02/03/2013 with benign pathology. Port-A-Cath placement 01/09/2012 in interventional radiology. Removed 12/30/2012. 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 The finger numbness has improved.   Mild thrombocytopenia secondary to chemotherapy-resolved. History of neutropenia secondary to chemotherapy. 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     Disposition: Ms. Miskiewicz remains in clinical remission from colon cancer.  She would like to continue follow-up at the Cancer center.  She will return for an office visit in 1 year.  She is due for a surveillance colonoscopy.  I made referral to Dr. Ardis Hughs.  I encouraged her to discontinue smoking.  We see if she qualifies for the lung cancer screening program.  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  We will make an ENT referral given her smoking history.  We encouraged her to establish with a primary provider.  Betsy Coder, MD  02/28/2021  8:08 AM

## 2021-02-28 NOTE — Progress Notes (Signed)
Order placed per MD request for low dose CT screening for lung cancer due to 35 year smoking history and current everyday smoker.

## 2021-03-22 ENCOUNTER — Telehealth: Payer: Self-pay | Admitting: *Deleted

## 2021-03-22 NOTE — Telephone Encounter (Signed)
[facility] has denied the low dose CT screening exam for lung cancer due to insufficient documentation of smoking history. Left VM for patient to call to provide this information and also send message on MyChart.

## 2021-04-10 ENCOUNTER — Telehealth: Payer: Self-pay | Admitting: *Deleted

## 2021-04-10 NOTE — Telephone Encounter (Signed)
Called patient to f/u on referral to [facility] ENT for lesion on palate. She reports she has not heard from them for appointment, but admits she does not answer calls if she does not recognize the number. Provided her the phone # to call [facility] for appointment.

## 2021-04-17 ENCOUNTER — Ambulatory Visit (HOSPITAL_BASED_OUTPATIENT_CLINIC_OR_DEPARTMENT_OTHER): Payer: Self-pay | Admitting: Family Medicine

## 2021-04-21 ENCOUNTER — Ambulatory Visit (HOSPITAL_BASED_OUTPATIENT_CLINIC_OR_DEPARTMENT_OTHER): Payer: BC Managed Care – PPO | Admitting: Family Medicine

## 2021-04-25 ENCOUNTER — Encounter: Payer: Self-pay | Admitting: Gastroenterology

## 2021-04-28 ENCOUNTER — Encounter (HOSPITAL_BASED_OUTPATIENT_CLINIC_OR_DEPARTMENT_OTHER): Payer: Self-pay | Admitting: Family Medicine

## 2021-05-01 ENCOUNTER — Telehealth: Payer: Self-pay | Admitting: *Deleted

## 2021-05-01 NOTE — Telephone Encounter (Signed)
Patient reports she sees ENT on 06/07/21 and has not heard from GI yes on colonoscopy. Sent message to [facility] to f/u on referral. Due colonoscopy this year.

## 2021-06-29 ENCOUNTER — Encounter: Payer: Self-pay | Admitting: Gastroenterology

## 2021-07-21 ENCOUNTER — Ambulatory Visit (AMBULATORY_SURGERY_CENTER): Payer: BC Managed Care – PPO | Admitting: *Deleted

## 2021-07-21 ENCOUNTER — Encounter: Payer: Self-pay | Admitting: Gastroenterology

## 2021-07-21 ENCOUNTER — Other Ambulatory Visit: Payer: Self-pay

## 2021-07-21 VITALS — Ht 65.0 in | Wt 143.0 lb

## 2021-07-21 DIAGNOSIS — Z85038 Personal history of other malignant neoplasm of large intestine: Secondary | ICD-10-CM

## 2021-07-21 MED ORDER — ONDANSETRON HCL 4 MG PO TABS: 4.0000 mg | ORAL_TABLET | ORAL | 0 refills | Status: DC

## 2021-07-21 MED ORDER — NA SULFATE-K SULFATE-MG SULF 17.5-3.13-1.6 GM/177ML PO SOLN: 2.0000 | Freq: Once | ORAL | 0 refills | Status: AC

## 2021-07-21 NOTE — Progress Notes (Signed)
No egg or soy allergy known to patient  No issues known to pt with past sedation with any surgeries or procedures Patient denies ever being told they had issues or difficulty with intubation  No FH of Malignant Hyperthermia Pt is not on diet pills Pt is not on  home 02  Pt is not on blood thinners  Pt denies issues with constipation  No A fib or A flutter  Pt is fully vaccinated  for Covid  Prescription sent in for zofran, pt. Complained of nausea with Suprep in 2017.  Due to the COVID-19 pandemic we are asking patients to follow certain guidelines in PV and the [place]   Pt aware of COVID protocols and [facility] guidelines   PV completed over the phone. Pt verified name, DOB, address and insurance during PV today.  Pt mailed instruction packet with copy of consent form to read and not return, and instructions.  Pt encouraged to call with questions or issues.  If pt has My chart, procedure instructions sent via My Chart

## 2021-07-24 ENCOUNTER — Other Ambulatory Visit: Payer: Self-pay

## 2021-07-24 ENCOUNTER — Ambulatory Visit (HOSPITAL_COMMUNITY)
Admission: EM | Admit: 2021-07-24 | Discharge: 2021-07-24 | Disposition: A | Discharge status: Home / Self Care | Payer: BC Managed Care – PPO | Attending: Internal Medicine | Admitting: Internal Medicine

## 2021-07-24 ENCOUNTER — Encounter (HOSPITAL_COMMUNITY): Payer: Self-pay

## 2021-07-24 DIAGNOSIS — U071 COVID-19: Secondary | ICD-10-CM | POA: Diagnosis not present

## 2021-07-24 DIAGNOSIS — J029 Acute pharyngitis, unspecified: Secondary | ICD-10-CM | POA: Insufficient documentation

## 2021-07-24 LAB — BASIC METABOLIC PANEL
Anion gap: 9 (ref 5–15)
BUN: 11 mg/dL (ref 8–23)
CO2: 28 mmol/L (ref 22–32)
Calcium: 9.4 mg/dL (ref 8.9–10.3)
Chloride: 101 mmol/L (ref 98–111)
Creatinine, Ser: 0.96 mg/dL (ref 0.44–1.00)
GFR, Estimated: >60 mL/min (ref >60)
Glucose, Bld: 99 mg/dL (ref 70–99)
Potassium: 3.5 mmol/L (ref 3.5–5.1)
Sodium: 138 mmol/L (ref 135–145)

## 2021-07-24 LAB — CBC WITH DIFFERENTIAL/PLATELET
Abs Immature Granulocytes: 0.02 10*3/uL (ref 0.00–0.07)
Basophils Absolute: 0 10*3/uL (ref 0.0–0.1)
Basophils Relative: 1 %
Eosinophils Absolute: 0.1 10*3/uL (ref 0.0–0.5)
Eosinophils Relative: 1 %
HCT: 42.8 % (ref 36.0–46.0)
Hemoglobin: 14 g/dL (ref 12.0–15.0)
Immature Granulocytes: 0 %
Lymphocytes Relative: 45 %
Lymphs Abs: 2.9 10*3/uL (ref 0.7–4.0)
MCH: 29.7 pg (ref 26.0–34.0)
MCHC: 32.7 g/dL (ref 30.0–36.0)
MCV: 90.9 fL (ref 80.0–100.0)
Monocytes Absolute: 0.8 10*3/uL (ref 0.1–1.0)
Monocytes Relative: 13 %
Neutro Abs: 2.6 10*3/uL (ref 1.7–7.7)
Neutrophils Relative %: 40 %
Platelets: 215 10*3/uL (ref 150–400)
RBC: 4.71 MIL/uL (ref 3.87–5.11)
RDW: 12.8 % (ref 11.5–15.5)
WBC: 6.5 10*3/uL (ref 4.0–10.5)
nRBC: 0 % (ref 0.0–0.2)

## 2021-07-24 MED ORDER — BENZONATATE 100 MG PO CAPS: 100.0000 mg | ORAL_CAPSULE | Freq: Three times a day (TID) | ORAL | 0 refills | Status: DC | PRN

## 2021-07-24 NOTE — ED Triage Notes (Signed)
Pt presents with non productive cough and chills X 3 days.

## 2021-07-24 NOTE — Discharge Instructions (Addendum)
Warm salt water gargle Take medications as prescribed Tylenol as needed for fever and/or body aches. Increase oral fluid intake If COVID-19 test is positive, you will qualify for antiviral medications We will call you with recommendations if labs are abnormal

## 2021-07-25 ENCOUNTER — Telehealth (HOSPITAL_COMMUNITY): Payer: Self-pay | Admitting: Internal Medicine

## 2021-07-25 ENCOUNTER — Telehealth (HOSPITAL_COMMUNITY): Payer: Self-pay | Admitting: Emergency Medicine

## 2021-07-25 LAB — SARS CORONAVIRUS 2 (TAT 6-24 HRS): SARS Coronavirus 2: POSITIVE — AB

## 2021-07-25 MED ORDER — NIRMATRELVIR/RITONAVIR (PAXLOVID)TABLET: 3.0000 | ORAL_TABLET | Freq: Two times a day (BID) | ORAL | 0 refills | Status: AC

## 2021-07-25 NOTE — ED Provider Notes (Signed)
[place] [facility]    CSN: [id number] Arrival date & time: 07/24/21  1700      History   Chief Complaint Chief Complaint  Patient presents with   Cough   Chills    HPI [name] is a 76 y.o. female comes to the urgent care with a 3-day history of sore throat, subjective fever, chills and nonproductive cough.  Patient's symptoms started 3 days ago and has been persistent.  Some things not associated with pain on swallowing.  Patient's oral intake is diminished.  She denies any nausea, vomiting or diarrhea.  She denies any shortness of breath or wheezing.  Patient is fully vaccinated against COVID-19 virus.  She has a headache and generalized body aches.  No sick contacts.Marland Kitchen   HPI  Past Medical History:  Diagnosis Date   Abdominal pain    Allergy    Anemia    Blood transfusion without reported diagnosis 1980   Cancer ([place]) 11/2011   cancer in colon   Colon cancer ([place]) 12/18/2011   DVT (deep venous thrombosis) ([place]) 05/2012   (R)IJ and subclavian, stopped Coumadin July 2014   History of kidney stones    Irregular heart rate    since child    Patient Active Problem List   Diagnosis Date Noted   Hypertension 05/30/2018   Neck pain 05/09/2018   Leukopenia 08/09/2015   Pyelonephritis 08/08/2015   DVT (deep venous thrombosis) ([place]) 06/16/2012   Cancer ([place]) 01/20/2012   Colon cancer ([place]) 12/18/2011   Nonspecific (abnormal) findings on radiological and other examination of gastrointestinal tract 10/03/2011    Past Surgical History:  Procedure Laterality Date   CHOLECYSTECTOMY     COLON RESECTION  12/18/2011   Procedure: COLON RESECTION LAPAROSCOPIC;  Surgeon: Marcello Moores A. Cornett, MD;  Location: [facility];  Service: General;  Laterality: N/A;   COLON SURGERY     COLONOSCOPY     DILATATION & CURRETTAGE/HYSTEROSCOPY WITH RESECTOCOPE N/A 02/03/2013   Procedure: DILATATION & CURETTAGE/HYSTEROSCOPY, removal  of endometrial mass;  Surgeon: Sharene Butters, MD;  Location:  [facility];  Service: Gynecology;  Laterality: N/A;   goiter removal      KIDNEY STONE SURGERY     PORTACATH PLACEMENT  11/2011   12 treatments of chemo/ last treatment 2014   portacath removal  12/30/12   TONSILLECTOMY     as child    OB History     Gravida  0   Para  0   Term  0   Preterm  0   AB  0   Living         SAB  0   IAB  0   Ectopic  0   Multiple      Live Births               Home Medications    Prior to Admission medications   Medication Sig Start Date End Date Taking? Authorizing Provider  benzonatate (TESSALON) 100 MG capsule Take 1 capsule (100 mg total) by mouth 3 (three) times daily as needed for cough. 07/24/21  Yes Benford Asch, Myrene Galas, MD  Multiple Vitamin (MULTIVITAMIN) tablet Take 1 tablet by mouth daily.    [provider]  ondansetron (ZOFRAN) 4 MG tablet Take 1 tablet (4 mg total) by mouth as directed. Take 1 tablet 30-60 minutes prior to each colonoscopy prep dose 07/21/21   Milus Banister, MD    Family History Family History  Problem Relation Age  of Onset   Breast cancer Mother    Heart disease Mother    Hypertension Other    Colon polyps Neg Hx    Esophageal cancer Neg Hx    Stomach cancer Neg Hx    Rectal cancer Neg Hx    Colon cancer Neg Hx     Social History Social History   Tobacco Use   Smoking status: Some Days    Packs/day: 0.25    Years: 35.00    Pack years: 8.75    Types: Cigarettes   Smokeless tobacco: Never   Tobacco comments:    smoke 2 cigarettes a day/occasional  Vaping Use   Vaping Use: Never used  Substance Use Topics   Alcohol use: No   Drug use: No     Allergies   Aspirin, Advil [ibuprofen], Aleve [naproxen sodium], and Penicillins   Review of Systems Review of Systems  Constitutional:  Positive for chills and fatigue. Negative for fever.  HENT:  Positive for congestion and sore throat.   Respiratory:  Positive for cough. Negative for shortness of breath and wheezing.    Cardiovascular: Negative.   Musculoskeletal:  Positive for myalgias.  Neurological:  Positive for headaches.    Physical Exam Triage Vital Signs ED Triage Vitals  Enc Vitals Group     BP 07/24/21 1810 107/72     Pulse Rate 07/24/21 1810 94     Resp 07/24/21 1810 16     Temp 07/24/21 1810 99.6 F (37.6 C)     Temp Source 07/24/21 1810 Oral     SpO2 07/24/21 1810 92 %     Weight --      Height --      Head Circumference --      Peak Flow --      Pain Score 07/24/21 1814 1     Pain Loc --      Pain Edu? --      Excl. in [place]? --    No data found.  Updated Vital Signs BP 107/72 (BP Location: Left Arm)    Pulse 94    Temp 99.6 F (37.6 C) (Oral)    Resp 16    SpO2 92%   Visual Acuity Right Eye Distance:   Left Eye Distance:   Bilateral Distance:    Right Eye Near:   Left Eye Near:    Bilateral Near:     Physical Exam Vitals and nursing note reviewed.  Constitutional:      Appearance: She is ill-appearing.  HENT:     Right Ear: Tympanic membrane normal.     Left Ear: Tympanic membrane normal.     Mouth/Throat:     Mouth: Mucous membranes are moist.     Pharynx: Posterior oropharyngeal erythema present.  Cardiovascular:     Rate and Rhythm: Normal rate and regular rhythm.  Pulmonary:     Effort: Pulmonary effort is normal.     Breath sounds: Normal breath sounds. No wheezing, rhonchi or rales.  Abdominal:     General: Bowel sounds are normal.     Palpations: Abdomen is soft.  Neurological:     Mental Status: She is alert.     UC Treatments / Results  Labs (all labs ordered are listed, but only abnormal results are displayed) Labs Reviewed  SARS CORONAVIRUS 2 (TAT 6-24 HRS) - Abnormal; Notable for the following components:      Result Value   SARS Coronavirus 2 POSITIVE (*)    All  other components within normal limits  CBC WITH DIFFERENTIAL/PLATELET  BASIC METABOLIC PANEL    EKG   Radiology No results found.  Procedures Procedures (including  critical care time)  Medications Ordered in UC Medications - No data to display  Initial Impression / Assessment and Plan / UC Course  I have reviewed the triage vital signs and the nursing notes.  Pertinent labs & imaging results that were available during my care of the patient were reviewed by me and considered in my medical decision making (see chart for details).     1.  Acute viral pharyngitis: CBC, BMP COVID-19 PCR test has been sent Tessalon as needed for cough We will call you with recommendations if COVID-19 test is positive Patient qualifies for antibiotic therapy if COVID-19 results are positive. Return precautions given Final Clinical Impressions(s) / UC Diagnoses   Final diagnoses:  Acute viral pharyngitis     Discharge Instructions      Warm salt water gargle Take medications as prescribed Tylenol as needed for fever and/or body aches. Increase oral fluid intake If COVID-19 test is positive, you will qualify for antiviral medications We will call you with recommendations if labs are abnormal   ED Prescriptions     Medication Sig Dispense Auth. Provider   benzonatate (TESSALON) 100 MG capsule Take 1 capsule (100 mg total) by mouth 3 (three) times daily as needed for cough. 21 capsule Phyliss Hulick, Myrene Galas, MD      PDMP not reviewed this encounter.   Chase Picket, MD 07/26/21 2146

## 2021-07-26 ENCOUNTER — Telehealth: Payer: Self-pay | Admitting: Gastroenterology

## 2021-07-26 DIAGNOSIS — Z85038 Personal history of other malignant neoplasm of large intestine: Secondary | ICD-10-CM

## 2021-07-26 MED ORDER — NA SULFATE-K SULFATE-MG SULF 17.5-3.13-1.6 GM/177ML PO SOLN: 1.0000 | ORAL | 0 refills | Status: DC

## 2021-07-26 NOTE — Telephone Encounter (Signed)
Covid + test on 07/24/2021 at Urgent Care. Rescheduled patient to 2/21, new prep instructions mailed to pt and mychart and resent suprep rx for pt. Pt states she has Zofran at home but not suprep. Pt aware.

## 2021-07-26 NOTE — Telephone Encounter (Signed)
Patient has colonoscopy with Dr. Ardis Hughs on Monday.  She has tested positive for Covid and she has acute viral pharyngitis and cannot speak above a whisper.  She is asking advice as to whether or not she should keep procedure appointment or if she should reschedule.  Please call and advise.  Thank you.

## 2021-07-31 ENCOUNTER — Encounter: Payer: BC Managed Care – PPO | Admitting: Gastroenterology

## 2021-08-07 ENCOUNTER — Other Ambulatory Visit: Payer: Self-pay

## 2021-08-07 ENCOUNTER — Ambulatory Visit
Admission: EM | Admit: 2021-08-07 | Discharge: 2021-08-07 | Disposition: A | Discharge status: Home / Self Care | Payer: BC Managed Care – PPO

## 2021-08-07 ENCOUNTER — Emergency Department (HOSPITAL_COMMUNITY): Payer: Medicare Other

## 2021-08-07 ENCOUNTER — Emergency Department (HOSPITAL_COMMUNITY)
Admission: EM | Admit: 2021-08-07 | Discharge: 2021-08-07 | Disposition: A | Discharge status: Home / Self Care | Payer: Medicare Other | Attending: Emergency Medicine | Admitting: Emergency Medicine

## 2021-08-07 ENCOUNTER — Encounter (HOSPITAL_COMMUNITY): Payer: Self-pay

## 2021-08-07 DIAGNOSIS — N390 Urinary tract infection, site not specified: Secondary | ICD-10-CM | POA: Diagnosis not present

## 2021-08-07 DIAGNOSIS — R5381 Other malaise: Secondary | ICD-10-CM | POA: Diagnosis not present

## 2021-08-07 DIAGNOSIS — R1031 Right lower quadrant pain: Secondary | ICD-10-CM

## 2021-08-07 DIAGNOSIS — R1084 Generalized abdominal pain: Secondary | ICD-10-CM

## 2021-08-07 LAB — COMPREHENSIVE METABOLIC PANEL
ALT: 17 U/L (ref 0–44)
AST: 17 U/L (ref 15–41)
Albumin: 3.8 g/dL (ref 3.5–5.0)
Alkaline Phosphatase: 74 U/L (ref 38–126)
Anion gap: 8 (ref 5–15)
BUN: 13 mg/dL (ref 8–23)
CO2: 26 mmol/L (ref 22–32)
Calcium: 9.1 mg/dL (ref 8.9–10.3)
Chloride: 104 mmol/L (ref 98–111)
Creatinine, Ser: 0.52 mg/dL (ref 0.44–1.00)
GFR, Estimated: >60 mL/min (ref >60)
Glucose, Bld: 72 mg/dL (ref 70–99)
Potassium: 3.3 mmol/L — ABNORMAL LOW (ref 3.5–5.1)
Sodium: 138 mmol/L (ref 135–145)
Total Bilirubin: 1.1 mg/dL (ref 0.3–1.2)
Total Protein: 7.3 g/dL (ref 6.5–8.1)

## 2021-08-07 LAB — URINALYSIS, ROUTINE W REFLEX MICROSCOPIC
Bilirubin Urine: NEGATIVE
Glucose, UA: NEGATIVE mg/dL
Ketones, ur: 80 mg/dL — AB
Nitrite: POSITIVE — AB
Protein, ur: NEGATIVE mg/dL
Specific Gravity, Urine: 1.02 (ref 1.005–1.030)
WBC, UA: >50 WBC/hpf — ABNORMAL HIGH (ref 0–5)
pH: 5 (ref 5.0–8.0)

## 2021-08-07 LAB — CBC WITH DIFFERENTIAL/PLATELET
Abs Immature Granulocytes: 0.03 10*3/uL (ref 0.00–0.07)
Basophils Absolute: 0 10*3/uL (ref 0.0–0.1)
Basophils Relative: 0 %
Eosinophils Absolute: 0.1 10*3/uL (ref 0.0–0.5)
Eosinophils Relative: 1 %
HCT: 36.8 % (ref 36.0–46.0)
Hemoglobin: 11.8 g/dL — ABNORMAL LOW (ref 12.0–15.0)
Immature Granulocytes: 0 %
Lymphocytes Relative: 35 %
Lymphs Abs: 2.7 10*3/uL (ref 0.7–4.0)
MCH: 29.8 pg (ref 26.0–34.0)
MCHC: 32.1 g/dL (ref 30.0–36.0)
MCV: 92.9 fL (ref 80.0–100.0)
Monocytes Absolute: 0.8 10*3/uL (ref 0.1–1.0)
Monocytes Relative: 10 %
Neutro Abs: 4.2 10*3/uL (ref 1.7–7.7)
Neutrophils Relative %: 54 %
Platelets: 313 10*3/uL (ref 150–400)
RBC: 3.96 MIL/uL (ref 3.87–5.11)
RDW: 13.1 % (ref 11.5–15.5)
WBC: 7.9 10*3/uL (ref 4.0–10.5)
nRBC: 0 % (ref 0.0–0.2)

## 2021-08-07 LAB — LIPASE, BLOOD: Lipase: 33 U/L (ref 11–51)

## 2021-08-07 LAB — LACTIC ACID, PLASMA: Lactic Acid, Venous: 0.8 mmol/L (ref 0.5–1.9)

## 2021-08-07 MED ORDER — CEPHALEXIN 250 MG PO CAPS: 250.0000 mg | ORAL_CAPSULE | Freq: Four times a day (QID) | ORAL | 0 refills | Status: DC

## 2021-08-07 MED ORDER — CEPHALEXIN 250 MG PO CAPS
250.0000 mg | ORAL_CAPSULE | Freq: Once | ORAL | Status: AC
  Administered 2021-08-07: 250 mg via ORAL
  Filled 2021-08-07: qty 1

## 2021-08-07 MED ORDER — IOHEXOL 300 MG/ML  SOLN
100.0000 mL | Freq: Once | INTRAMUSCULAR | Status: AC | PRN
  Administered 2021-08-07: 100 mL via INTRAVENOUS

## 2021-08-07 MED ORDER — KETOROLAC TROMETHAMINE 30 MG/ML IJ SOLN
15.0000 mg | Freq: Once | INTRAMUSCULAR | Status: AC
  Administered 2021-08-07: 15 mg via INTRAVENOUS
  Filled 2021-08-07: qty 1

## 2021-08-07 NOTE — Discharge Instructions (Signed)
Testing today indicates that you have some ongoing symptoms from a COVID infection.  Your urinalysis is abnormal and the discoloration of urine you are having, indicates that you have an infection.  We are starting you on a prescription antibiotic.  Start taking the medicine tomorrow morning.  We did a urine culture that needs follow-up on.  Please see a primary care doctor in a week or 2 for checkup and repeat urinalysis and further care as needed at that time.

## 2021-08-07 NOTE — ED Triage Notes (Signed)
Pt reports having abd pain and a fever for a few days.

## 2021-08-07 NOTE — ED Triage Notes (Signed)
Patient c/o fever x 3 days and RLQ abdominal pain x 2 days. Patient states worsening of abdominal pain today and went to an UC today and was referred to the ED for further evaluation

## 2021-08-07 NOTE — Discharge Instructions (Addendum)
-  Please head to the ED. I am concerned that you might have appendicitis. This requires imaging and labwork I can't do at this urgent care.

## 2021-08-07 NOTE — ED Notes (Signed)
Patient is being discharged from the Urgent Care and sent to the Emergency Department via private vehicle . Per Marin Roberts, APP, patient is in need of higher level of care due to severe, reproducible abdominal pain needing imaging. Patient is aware and verbalizes understanding of plan of care.  Vitals:   08/07/21 1238  BP: (!) 158/73  Pulse: 91  Resp: 18  Temp: 98.7 F (37.1 C)  SpO2: 97%

## 2021-08-07 NOTE — ED Provider Notes (Signed)
[facility] DEPT Provider Note   CSN: [id number] Arrival date & time: 08/07/21  1322     History  Chief Complaint  Patient presents with   Fever   Abdominal Pain    [name] is a 76 y.o. female.  HPI She complains of ongoing abdominal cramping for 2 weeks since being diagnosed with COVID.  She was treated with Paxlovid.  She is also been using benzoate for cough.  She denies fever, cough at this time, nausea, vomiting, diarrhea or constipation.  She was able to drive herself here for evaluation.    Home Medications Prior to Admission medications   Medication Sig Start Date End Date Taking? Authorizing Provider  cephALEXin (KEFLEX) 250 MG capsule Take 1 capsule (250 mg total) by mouth 4 (four) times daily. 08/07/21  Yes Daleen Bo, MD  benzonatate (TESSALON) 100 MG capsule Take 1 capsule (100 mg total) by mouth 3 (three) times daily as needed for cough. 07/24/21   Chase Picket, MD  Multiple Vitamin (MULTIVITAMIN) tablet Take 1 tablet by mouth daily.    [provider]  Na Sulfate-K Sulfate-Mg Sulf 17.5-3.13-1.6 GM/177ML SOLN Take 1 kit by mouth as directed. May use generic SUPREP; NO prior authorizations will be done. Use [facility] 07/26/21   Milus Banister, MD  ondansetron (ZOFRAN) 4 MG tablet Take 1 tablet (4 mg total) by mouth as directed. Take 1 tablet 30-60 minutes prior to each colonoscopy prep dose 07/21/21   Milus Banister, MD      Allergies    Aspirin, Advil [ibuprofen], Aleve [naproxen sodium], and Penicillins    Review of Systems   Review of Systems  Physical Exam Updated Vital Signs BP (!) 156/84 (BP Location: Right Arm)    Pulse 86    Temp 98.5 F (36.9 C) (Oral)    Resp 16    Ht $R'5\' 5"'Sw$  (1.651 m)    Wt 70.6 kg    SpO2 99%    BMI 25.89 kg/m  Physical Exam Vitals and nursing note reviewed.  Constitutional:      Appearance: She is well-developed. She is not ill-appearing.  HENT:     Head: Normocephalic and  atraumatic.     Right Ear: External ear normal.     Left Ear: External ear normal.  Eyes:     Conjunctiva/sclera: Conjunctivae normal.     Pupils: Pupils are equal, round, and reactive to light.  Neck:     Trachea: Phonation normal.  Cardiovascular:     Rate and Rhythm: Normal rate.  Pulmonary:     Effort: Pulmonary effort is normal.  Abdominal:     General: There is no distension.  Musculoskeletal:        General: Normal range of motion.     Cervical back: Normal range of motion and neck supple.  Skin:    General: Skin is warm and dry.  Neurological:     Mental Status: She is alert and oriented to person, place, and time.     Cranial Nerves: No cranial nerve deficit.     Sensory: No sensory deficit.     Motor: No abnormal muscle tone.     Coordination: Coordination normal.  Psychiatric:        Mood and Affect: Mood normal.        Behavior: Behavior normal.        Thought Content: Thought content normal.        Judgment: Judgment normal.  ED Results / Procedures / Treatments   Labs (all labs ordered are listed, but only abnormal results are displayed) Labs Reviewed  COMPREHENSIVE METABOLIC PANEL - Abnormal; Notable for the following components:      Result Value   Potassium 3.3 (*)    All other components within normal limits  CBC WITH DIFFERENTIAL/PLATELET - Abnormal; Notable for the following components:   Hemoglobin 11.8 (*)    All other components within normal limits  URINALYSIS, ROUTINE W REFLEX MICROSCOPIC - Abnormal; Notable for the following components:   APPearance HAZY (*)    Hgb urine dipstick MODERATE (*)    Ketones, ur 80 (*)    Nitrite POSITIVE (*)    Leukocytes,Ua LARGE (*)    WBC, UA >50 (*)    Bacteria, UA RARE (*)    All other components within normal limits  URINE CULTURE  LIPASE, BLOOD  LACTIC ACID, PLASMA    EKG None  Radiology CT Abdomen Pelvis W Contrast  Result Date: 08/07/2021 CLINICAL DATA:  RLQ abdominal pain (Age >= 14y)  EXAM: CT ABDOMEN AND PELVIS WITH CONTRAST TECHNIQUE: Multidetector CT imaging of the abdomen and pelvis was performed using the standard protocol following bolus administration of intravenous contrast. RADIATION DOSE REDUCTION: This exam was performed according to the departmental dose-optimization program which includes automated exposure control, adjustment of the mA and/or kV according to patient size and/or use of iterative reconstruction technique. CONTRAST:  131mL OMNIPAQUE IOHEXOL 300 MG/ML  SOLN COMPARISON:  None. FINDINGS: Lower chest: No acute abnormality.  Tiny hiatal hernia. Hepatobiliary: Redemonstration of a likely flash filling hemangioma at the right hepatic dome measuring up to 1.4 cm (2:7). Redemonstration of a likely simple hepatic cysts in the left hepatic lobe measuring 1.9 cm (2:13). Subcentimeter hypodensities are too small to characterize. No focal liver abnormality. Status post cholecystectomy. No biliary dilatation. Pancreas: No focal lesion. Normal pancreatic contour. No surrounding inflammatory changes. No main pancreatic ductal dilatation. Spleen: Normal in size without focal abnormality. Adrenals/Urinary Tract: No adrenal nodule bilaterally. Bilateral kidneys enhance symmetrically. There is a 1.3 cm right fluid density lesion that likely represents a simple renal cyst. No hydronephrosis. No hydroureter. The urinary bladder is unremarkable. On delayed imaging, there is no urothelial wall thickening and there are no filling defects in the opacified portions of the bilateral collecting systems or ureters. Stomach/Bowel: Partial colectomy. Stomach is within normal limits. No evidence of bowel wall thickening or dilatation. Scattered colonic diverticulosis. Appendix is normal in caliber with no right lower quadrant inflammatory changes to suggest acute appendicitis. Vascular/Lymphatic: No abdominal aorta or iliac aneurysm. Severe atherosclerotic plaque of the aorta and its branches. No  abdominal, pelvic, or inguinal lymphadenopathy. Reproductive: Coarse 1.2 cm calcification within the uterine wall likely represents a degenerative uterine fibroid. Otherwise uterus and bilateral adnexa are unremarkable. Other: No intraperitoneal free fluid. No intraperitoneal free gas. No organized fluid collection. Musculoskeletal: No abdominal wall hernia or abnormality. No suspicious lytic or blastic osseous lesions. No acute displaced fracture. Multilevel degenerative changes of the spine. IMPRESSION: 1. Scattered colonic diverticulosis with no acute diverticulitis. 2. Pericentimeter degenerative uterine fibroid. 3. Tiny hiatal hernia. 4. Aortic Atherosclerosis (ICD10-I70.0). Electronically Signed   By: Iven Finn M.D.   On: 08/07/2021 18:28    Procedures Procedures    Medications Ordered in ED Medications  iohexol (OMNIPAQUE) 300 MG/ML solution 100 mL (100 mLs Intravenous Contrast Given 08/07/21 1753)  cephALEXin (KEFLEX) capsule 250 mg (250 mg Oral Given 08/07/21 2028)  ketorolac (TORADOL) 30  MG/ML injection 15 mg (15 mg Intravenous Given 08/07/21 2029)    ED Course/ Medical Decision Making/ A&P                           Medical Decision Making She is presenting for ongoing symptoms following her recent COVID infection.  She complains primarily of malaise and abdominal pain which is described as cramping.  She was able to drive her vehicle here.  She currently lives alone and takes care of herself.  Problems Addressed: Generalized abdominal pain: acute illness or injury    Details: Onset with COVID infection Lower urinary tract infectious disease: acute illness or injury    Details: She has noticed discoloration of her urine and discomfort when she urinates Malaise: acute illness or injury    Details: Crying after recent COVID infection  Amount and/or Complexity of Data Reviewed External Data Reviewed: labs and notes.    Details: Urgent care evaluation on 07/24/2021 when she was  diagnosed with COVID infection. Labs: ordered.    Details: CBC, metabolic panel, urinalysis-normal except hemoglobin low urine with large leukocytes and blood, on clean-catch sample.  Urine culture ordered because she is symptomatic for UTI. Radiology: ordered and independent interpretation performed.    Details: CT abdomen pelvis-no visualized acute abnormality  Risk Prescription drug management. Decision regarding hospitalization. Risk Details: Patient presenting for evaluation of nonspecific ongoing symptoms after COVID infection.  She has general malaise, and continued abdominal pain.  Evaluation does not show severe acute intra-abdominal problems.  She has change in her urine color and discomfort when urinating so she is started on Keflex for UTI.  Urine culture was done.           Final Clinical Impression(s) / ED Diagnoses Final diagnoses:  Lower urinary tract infectious disease  Malaise  Generalized abdominal pain    Rx / DC Orders ED Discharge Orders          Ordered    cephALEXin (KEFLEX) 250 MG capsule  4 times daily        08/07/21 2013              Daleen Bo, MD 08/08/21 1246

## 2021-08-07 NOTE — ED Provider Notes (Signed)
[facility]    CSN: [id number] Arrival date & time: 08/07/21  1142      History   Chief Complaint Chief Complaint  Patient presents with   Fever   Abdominal Pain    HPI [name] is a 76 y.o. female presenting with fevers at home and abdominal cramping.  History COVID-19 about 2 weeks ago, colon cancer 2013 status post surgical removal, and cholecystectomy.  States she was diagnosed with COVID-19 1/30, she was feeling completely better from this but now new symptoms developed. Describes colicky right lower quadrant abdominal pain for about 3 days.  Temperature running lowgrade fevers at home per home thermometer - 100.9. no antipyretic today.  Denies nausea, vomiting, diarrhea, urinary frequency, urgency, hematuria.  Denies flank pain, fever/chills.  Denies shortness of breath, chest pain.   HPI  Past Medical History:  Diagnosis Date   Abdominal pain    Allergy    Anemia    Blood transfusion without reported diagnosis 1980   Cancer (HCC) 11/2011   cancer in colon   Colon cancer (HCC) 12/18/2011   DVT (deep venous thrombosis) (HCC) 05/2012   (R)IJ and subclavian, stopped Coumadin July 2014   History of kidney stones    Irregular heart rate    since child    Patient Active Problem List   Diagnosis Date Noted   Hypertension 05/30/2018   Neck pain 05/09/2018   Leukopenia 08/09/2015   Pyelonephritis 08/08/2015   DVT (deep venous thrombosis) (HCC) 06/16/2012   Cancer (HCC) 01/20/2012   Colon cancer (HCC) 12/18/2011   Nonspecific (abnormal) findings on radiological and other examination of gastrointestinal tract 10/03/2011    Past Surgical History:  Procedure Laterality Date   CHOLECYSTECTOMY     COLON RESECTION  12/18/2011   Procedure: COLON RESECTION LAPAROSCOPIC;  Surgeon: Maisie Fus A. Cornett, MD;  Location: [facility] OR;  Service: General;  Laterality: N/A;   COLON SURGERY     COLONOSCOPY     DILATATION & CURRETTAGE/HYSTEROSCOPY WITH RESECTOCOPE N/A  02/03/2013   Procedure: DILATATION & CURETTAGE/HYSTEROSCOPY, removal  of endometrial mass;  Surgeon: Mickel Baas, MD;  Location: [facility];  Service: Gynecology;  Laterality: N/A;   goiter removal      KIDNEY STONE SURGERY     PORTACATH PLACEMENT  11/2011   12 treatments of chemo/ last treatment 2014   portacath removal  12/30/12   TONSILLECTOMY     as child    OB History     Gravida  0   Para  0   Term  0   Preterm  0   AB  0   Living         SAB  0   IAB  0   Ectopic  0   Multiple      Live Births               Home Medications    Prior to Admission medications   Medication Sig Start Date End Date Taking? Authorizing Provider  benzonatate (TESSALON) 100 MG capsule Take 1 capsule (100 mg total) by mouth 3 (three) times daily as needed for cough. 07/24/21   Merrilee Jansky, MD  Multiple Vitamin (MULTIVITAMIN) tablet Take 1 tablet by mouth daily.    [provider]  Na Sulfate-K Sulfate-Mg Sulf 17.5-3.13-1.6 GM/177ML SOLN Take 1 kit by mouth as directed. May use generic SUPREP; NO prior authorizations will be done. Use [facility] 07/26/21   Rachael Fee, MD  ondansetron (ZOFRAN) 4 MG tablet Take 1 tablet (4 mg total) by mouth as directed. Take 1 tablet 30-60 minutes prior to each colonoscopy prep dose 07/21/21   Milus Banister, MD    Family History Family History  Problem Relation Age of Onset   Breast cancer Mother    Heart disease Mother    Hypertension Other    Colon polyps Neg Hx    Esophageal cancer Neg Hx    Stomach cancer Neg Hx    Rectal cancer Neg Hx    Colon cancer Neg Hx     Social History Social History   Tobacco Use   Smoking status: Some Days    Packs/day: 0.25    Years: 35.00    Pack years: 8.75    Types: Cigarettes   Smokeless tobacco: Never   Tobacco comments:    smoke 2 cigarettes a day/occasional  Vaping Use   Vaping Use: Never used  Substance Use Topics   Alcohol use: No   Drug use: No     Allergies    Aspirin, Advil [ibuprofen], Aleve [naproxen sodium], and Penicillins   Review of Systems Review of Systems  Gastrointestinal:  Positive for abdominal pain.  All other systems reviewed and are negative.   Physical Exam Triage Vital Signs ED Triage Vitals  Enc Vitals Group     BP 08/07/21 1238 (!) 158/73     Pulse Rate 08/07/21 1238 91     Resp 08/07/21 1238 18     Temp 08/07/21 1238 98.7 F (37.1 C)     Temp Source 08/07/21 1238 Oral     SpO2 08/07/21 1238 97 %     Weight --      Height --      Head Circumference --      Peak Flow --      Pain Score 08/07/21 1237 10     Pain Loc --      Pain Edu? --      Excl. in [place]? --    No data found.  Updated Vital Signs BP (!) 158/73 (BP Location: Right Arm)    Pulse 91    Temp 98.7 F (37.1 C) (Oral)    Resp 18    SpO2 97%   Visual Acuity Right Eye Distance:   Left Eye Distance:   Bilateral Distance:    Right Eye Near:   Left Eye Near:    Bilateral Near:     Physical Exam Vitals reviewed.  Constitutional:      General: She is not in acute distress.    Appearance: Normal appearance. She is not ill-appearing.  HENT:     Head: Normocephalic and atraumatic.     Mouth/Throat:     Mouth: Mucous membranes are moist.     Comments: Moist mucous membranes Eyes:     Extraocular Movements: Extraocular movements intact.     Pupils: Pupils are equal, round, and reactive to light.  Cardiovascular:     Rate and Rhythm: Normal rate and regular rhythm.     Heart sounds: Normal heart sounds.  Pulmonary:     Effort: Pulmonary effort is normal.     Breath sounds: Normal breath sounds. No wheezing, rhonchi or rales.  Abdominal:     General: Bowel sounds are normal. There is no distension.     Palpations: Abdomen is soft. There is no mass.     Tenderness: There is abdominal tenderness in the right upper quadrant and right lower quadrant.  There is no right CVA tenderness, left CVA tenderness, guarding or rebound. Positive signs  include McBurney's sign. Negative signs include Murphy's sign and Rovsing's sign.     Comments: Well-healed vertical surgical scar.  Right lower quadrant is exquisitely tender to palpation, with guarding and rebound.  No mass or hernia palpated.  There is some right upper quadrant tenderness as well.  Skin:    General: Skin is warm.     Capillary Refill: Capillary refill takes less than 2 seconds.     Comments: Good skin turgor  Neurological:     General: No focal deficit present.     Mental Status: She is alert and oriented to person, place, and time.  Psychiatric:        Mood and Affect: Mood normal.        Behavior: Behavior normal.     UC Treatments / Results  Labs (all labs ordered are listed, but only abnormal results are displayed) Labs Reviewed - No data to display   EKG   Radiology No results found.  Procedures Procedures (including critical care time)  Medications Ordered in UC Medications - No data to display  Initial Impression / Assessment and Plan / UC Course  I have reviewed the triage vital signs and the nursing notes.  Pertinent labs & imaging results that were available during my care of the patient were reviewed by me and considered in my medical decision making (see chart for details).     This patient is a very pleasant 76 y.o. year old female presenting with RLQ pain.  Currently afebrile and nontachycardic, but temperature as high as 100.9 per home thermometer.  History cholecystectomy and colon cancer.  She is exquisitely tender to palpation in the right lower quadrant.  DDx is appendicitis versus colitis versus other.  Sent to the emergency department via personal vehicle, she is in agreement.  Final Clinical Impressions(s) / UC Diagnoses   Final diagnoses:  RLQ abdominal pain     Discharge Instructions      -Please head to the ED. I am concerned that you might have appendicitis. This requires imaging and labwork I can't do at this urgent  care.      ED Prescriptions   None    PDMP not reviewed this encounter.   Hazel Sams, PA-C 08/07/21 1303

## 2021-08-07 NOTE — ED Provider Triage Note (Signed)
Emergency Medicine Provider Triage Evaluation Note  [name] , a 76 y.o. female  was evaluated in triage.  Pt complains of abdominal pain, sent from urgent care for further evaluation.  Patient reports intermittent fevers over the past 3 days up to 100.9, did not take any antipyretics this morning, also reports worsening right lower abdominal pain.  Pain has been constant and worsening.  At urgent care patient had significant right lower quadrant tenderness, they were concerned for appendicitis and sent her in for evaluation.  Reports prior surgery for colon cancer but no other surgeries.  Review of Systems  Positive: Fever, abdominal pain Negative: Vomiting, diarrhea, chest pain, shortness of breath  Physical Exam  BP 140/75 (BP Location: Right Arm)    Pulse 74    Temp 98.5 F (36.9 C) (Oral)    Resp 16    Ht 5\' 5"  (1.651 m)    Wt 64.9 kg    SpO2 100%    BMI 23.80 kg/m  Gen:   Awake, no distress   Resp:  Normal effort  MSK:   Moves extremities without difficulty  Other:  Severe right lower quadrant tenderness with guarding and rebound  Medical Decision Making  Medically screening exam initiated at 1:49 PM.  Appropriate orders placed.  Deyanira Fesler Kaeding was informed that the remainder of the evaluation will be completed by another provider, this initial triage assessment does not replace that evaluation, and the importance of remaining in the ED until their evaluation is complete.  Concern for appendicitis, fortunately afebrile here, will order labs and CT   Jacqlyn Larsen, [place] 08/07/21 1352

## 2021-08-10 ENCOUNTER — Telehealth: Payer: Self-pay | Admitting: Gastroenterology

## 2021-08-10 LAB — URINE CULTURE: Culture: >=100000 — AB

## 2021-08-10 NOTE — Telephone Encounter (Signed)
Inbound call from patient that she has a procedure on 2/21 and she is on antibiotics and was wondering if that will interfere with her procedure. Please advise.

## 2021-08-10 NOTE — Telephone Encounter (Signed)
The pt has been advised that she is ok to proceed with procedure as planned.  She is on abx for UTI infection as of 1 week ago.  She feels much better at this time.

## 2021-08-11 ENCOUNTER — Telehealth: Payer: Self-pay

## 2021-08-11 NOTE — Telephone Encounter (Signed)
Post ED Visit - Positive Culture Follow-up  Culture report reviewed by antimicrobial stewardship pharmacist: [place] Team []  Elenor Quinones, Pharm.D. []  Heide Guile, Pharm.D., BCPS AQ-ID []  Parks Neptune, Pharm.D., BCPS []  Alycia Rossetti, Pharm.D., BCPS []  [place]is [place], Pharm.D., BCPS, AAHIVP []  Legrand Como, Pharm.D., BCPS, AAHIVP []  Salome Arnt, PharmD, BCPS []  Johnnette Gourd, PharmD, BCPS []  Hughes Better, PharmD, BCPS []  Leeroy Cha, PharmD []  Laqueta Linden, PharmD, BCPS []  Albertina Parr, PharmD  [place] Team [x]  Jimmy Footman, PharmD []  Lindell Spar, PharmD []  Royetta Asal, PharmD []  Graylin Shiver, Rph []  Rema Fendt) Glennon Mac, PharmD []  Arlyn Dunning, PharmD []  Netta Cedars, PharmD []  Dia Sitter, PharmD []  Leone Haven, PharmD []  Gretta Arab, PharmD []  Theodis Shove, PharmD []  Peggyann Juba, PharmD []  Reuel Boom, PharmD   Positive urine culture Treated with Cephalexin, organism sensitive to the same and no further patient follow-up is required at this time.  Glennon Hamilton 08/11/2021, 9:55 AM

## 2021-08-15 ENCOUNTER — Ambulatory Visit (AMBULATORY_SURGERY_CENTER): Payer: BC Managed Care – PPO | Admitting: Gastroenterology

## 2021-08-15 ENCOUNTER — Encounter: Payer: Self-pay | Admitting: Gastroenterology

## 2021-08-15 ENCOUNTER — Other Ambulatory Visit: Payer: Self-pay

## 2021-08-15 VITALS — BP 124/67 | HR 65 | Temp 95.7°F | Resp 14 | Ht 66.0 in | Wt 143.0 lb

## 2021-08-15 DIAGNOSIS — Z85038 Personal history of other malignant neoplasm of large intestine: Secondary | ICD-10-CM | POA: Diagnosis present

## 2021-08-15 MED ORDER — SODIUM CHLORIDE 0.9 % IV SOLN: 500.0000 mL | INTRAVENOUS | Status: DC

## 2021-08-15 NOTE — Op Note (Signed)
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                            Adjuvant FOLFOX chemotherapy was initiated on                            01/21/2012; colonoscopy Dr. Ardis Hughs 02/2013 single                            HP. Colonoscopy 2017 no polyps. Medicines:                Monitored Anesthesia Care Procedure:                Pre-Anesthesia Assessment:                           - Prior to the procedure, a History and Physical                            was performed, and patient medications and                            allergies were reviewed. The patient's tolerance of                            previous anesthesia was also reviewed. The risks                            and benefits of the procedure and the sedation                            options and risks were discussed with the patient.                            All questions were answered, and informed consent                            was obtained. Prior Anticoagulants: The patient has  taken no previous anticoagulant or antiplatelet                            agents. ASA Grade Assessment: II - A patient with                            mild systemic disease. After reviewing the risks                            and benefits, the patient was deemed in                            satisfactory condition to undergo the procedure.                           After obtaining informed consent, the  colonoscope                            was passed under direct vision. Throughout the                            procedure, the patient's blood pressure, pulse, and                            oxygen saturations were monitored continuously. The                            [id number]-HQ190L Colonoscope was introduced through the                            anus and advanced to the the cecum, identified by                            appendiceal orifice and ileocecal valve. The                            colonoscopy was performed without difficulty. The                            patient tolerated the procedure well. The quality                            of the bowel preparation was good. The ileocecal                            valve, appendiceal orifice, and rectum were                            photographed. Small rectal vault precluded                            retroflex view of the anus. Scope In: 2:54:32 PM Scope Out: 3:03:50 PM Scope Withdrawal Time: 0 hours 6 minutes 36 seconds  Total Procedure Duration: 0  hours 9 minutes 18 seconds  Findings:                 Multiple small and large-mouthed diverticula were                            found in the entire colon.                           The exam was otherwise without abnormality. Complications:            No immediate complications. Estimated blood loss:                            None. Estimated Blood Loss:     Estimated blood loss: none. Impression:               - Diverticulosis in the entire examined colon.                           - The examination was otherwise normal.                           - No polyps or cancers. Recommendation:           - Patient has a contact number available for                            emergencies. The signs and symptoms of potential                            delayed complications were discussed with the                            patient. Return to normal activities tomorrow.                             Written discharge instructions were provided to the                            patient.                           - Resume previous diet.                           - Continue present medications.                           - Repeat colonoscopy in 5 years for surveillance. Milus Banister, MD 08/15/2021 3:09:24 PM This report has been signed electronically.

## 2021-08-15 NOTE — Patient Instructions (Signed)
Handout on diverticulosis provided.  Repeat colonoscopy in 5 years for surveillance.    YOU HAD AN ENDOSCOPIC PROCEDURE TODAY AT [facility]:   Refer to the procedure report that was given to you for any specific questions about what was found during the examination.  If the procedure report does not answer your questions, please call your gastroenterologist to clarify.  If you requested that your care partner not be given the details of your procedure findings, then the procedure report has been included in a sealed envelope for you to review at your convenience later.  YOU SHOULD EXPECT: Some feelings of bloating in the abdomen. Passage of more gas than usual.  Walking can help get rid of the air that was put into your GI tract during the procedure and reduce the bloating. If you had a lower endoscopy (such as a colonoscopy or flexible sigmoidoscopy) you may notice spotting of blood in your stool or on the toilet paper. If you underwent a bowel prep for your procedure, you may not have a normal bowel movement for a few days.  Please Note:  You might notice some irritation and congestion in your nose or some drainage.  This is from the oxygen used during your procedure.  There is no need for concern and it should clear up in a day or so.  SYMPTOMS TO REPORT IMMEDIATELY:  Following lower endoscopy (colonoscopy or flexible sigmoidoscopy):  Excessive amounts of blood in the stool  Significant tenderness or worsening of abdominal pains  Swelling of the abdomen that is new, acute  Fever of 100F or higher  For urgent or emergent issues, a gastroenterologist can be reached at any hour by calling [phone]. Do not use MyChart messaging for urgent concerns.    DIET:  We do recommend a small meal at first, but then you may proceed to your regular diet.  Drink plenty of fluids but you should avoid alcoholic beverages for 24 hours.  ACTIVITY:  You should plan to take it easy for  the rest of today and you should NOT DRIVE or use heavy machinery until tomorrow (because of the sedation medicines used during the test).    FOLLOW UP: Our staff will call the number listed on your records 48-72 hours following your procedure to check on you and address any questions or concerns that you may have regarding the information given to you following your procedure. If we do not reach you, we will leave a message.  We will attempt to reach you two times.  During this call, we will ask if you have developed any symptoms of COVID 19. If you develop any symptoms (ie: fever, flu-like symptoms, shortness of breath, cough etc.) before then, please call [phone].  If you test positive for Covid 19 in the 2 weeks post procedure, please call and report this information to [place].    If any biopsies were taken you will be contacted by phone or by letter within the next 1-3 weeks.  Please call us at [phone] if you have not heard about the biopsies in 3 weeks.    SIGNATURES/CONFIDENTIALITY: You and/or your care partner have signed paperwork which will be entered into your electronic medical record.  These signatures attest to the fact that that the information above on your After Visit Summary has been reviewed and is understood.  Full responsibility of the confidentiality of this discharge information lies with you and/or your care-partner.

## 2021-08-15 NOTE — Progress Notes (Signed)
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 Adjuvant FOLFOX chemotherapy was initiated on 01/21/2012; colonoscopy Dr. Ardis Hughs 02/2013 single HP. Colonoscopy 2017 no polyps.   HPI: This is a woman with personal history of CRC   ROS: complete GI ROS as described in HPI, all other review negative.  Constitutional:  No unintentional weight loss   Past Medical History:  Diagnosis Date   Abdominal pain    Allergy    Anemia    Blood transfusion without reported diagnosis 1980   Cancer ([place]) 11/2011   cancer in colon   Colon cancer ([place]) 12/18/2011   DVT (deep venous thrombosis) ([place]) 05/2012   (R)IJ and subclavian, stopped Coumadin July 2014   History of kidney stones    Irregular heart rate    since child    Past Surgical History:  Procedure Laterality Date   CHOLECYSTECTOMY     COLON RESECTION  12/18/2011   Procedure: COLON RESECTION LAPAROSCOPIC;  Surgeon: Marcello Moores A. Cornett, MD;  Location: [facility];  Service: General;  Laterality: N/A;   COLON SURGERY     COLONOSCOPY     DILATATION & CURRETTAGE/HYSTEROSCOPY WITH RESECTOCOPE N/A 02/03/2013   Procedure: DILATATION & CURETTAGE/HYSTEROSCOPY, removal  of endometrial mass;  Surgeon: Sharene Butters, MD;  Location: [facility];  Service: Gynecology;  Laterality: N/A;   goiter removal      KIDNEY STONE SURGERY     PORTACATH PLACEMENT  11/2011   12 treatments of chemo/ last treatment 2014   portacath removal  12/30/12   TONSILLECTOMY     as child    Current Outpatient Medications  Medication Sig Dispense Refill   cephALEXin (KEFLEX) 250 MG capsule Take 1 capsule (250 mg total) by mouth 4 (four) times daily. 28 capsule 0   benzonatate (TESSALON) 100 MG capsule Take 1 capsule (100 mg total) by mouth 3 (three) times daily as needed for cough. 21 capsule 0   Multiple Vitamin (MULTIVITAMIN) tablet Take 1 tablet by mouth daily.     ondansetron (ZOFRAN) 4 MG tablet Take  1 tablet (4 mg total) by mouth as directed. Take 1 tablet 30-60 minutes prior to each colonoscopy prep dose 2 tablet 0   Current Facility-Administered Medications  Medication Dose Route Frequency Provider Last Rate Last Admin   0.9 %  sodium chloride infusion  500 mL Intravenous Continuous Milus Banister, MD        Allergies as of 08/15/2021 - Review Complete 08/15/2021  Allergen Reaction Noted   Aspirin Nausea And Vomiting 07/31/2011   Advil [ibuprofen] Palpitations 10/02/2011   Aleve [naproxen sodium] Palpitations 01/06/2015   Penicillins Rash 07/31/2011    Family History  Problem Relation Age of Onset   Breast cancer Mother    Heart disease Mother    Hypertension Other    Colon polyps Neg Hx    Esophageal cancer Neg Hx    Stomach cancer Neg Hx    Rectal cancer Neg Hx    Colon cancer Neg Hx     Social History   Socioeconomic History   Marital status: Widowed    Spouse name: Not on file   Number of children: 4   Years of education: Not on file   Highest education level: Not on file  Occupational History   Occupation: Resident Chartered loss adjuster: [employer]  Tobacco Use   Smoking status: Some Days    Packs/day: 0.25  Years: 35.00    Pack years: 8.75    Types: Cigarettes   Smokeless tobacco: Never   Tobacco comments:    smoke 2 cigarettes a day/occasional  Vaping Use   Vaping Use: Never used  Substance and Sexual Activity   Alcohol use: No   Drug use: No   Sexual activity: Not on file  Other Topics Concern   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      Children:   [place]-daughter-in-law   [place]   Social Determinants of Health   Financial Resource Strain: Not on file  Food Insecurity: Not on file  Transportation Needs: Not on file  Physical  Activity: Not on file  Stress: Not on file  Social Connections: Not on file  Intimate Partner Violence: Not on file     Physical Exam: BP (!) 155/81    Pulse 77    Temp (!) 95.7 F (35.4 C)    Ht 5\' 6"  (1.676 m)    Wt 143 lb (64.9 kg)    SpO2 98%    BMI 23.08 kg/m  Constitutional: generally well-appearing Psychiatric: alert and oriented x3 Lungs: CTA bilaterally Heart: no MCR  Assessment and plan: 76 y.o. female with personal h/o CRC  Surveillance colonoscoyp today  Care is appropriate for the ambulatory setting.  Owens Loffler, MD [facility] 08/15/2021, 2:39 PM

## 2021-08-15 NOTE — Progress Notes (Signed)
PT taken to PACU. Monitors in place. VSS. Report given to RN. 

## 2021-08-17 ENCOUNTER — Telehealth: Payer: Self-pay | Admitting: *Deleted

## 2021-08-17 NOTE — Telephone Encounter (Signed)
°  Follow up Call-  Call back number 08/15/2021  Post procedure Call Back phone  # [phone]  Permission to leave phone message Yes  Some recent data might be hidden     Patient questions:  Do you have a fever, pain , or abdominal swelling? No. Pain Score  0 *  Have you tolerated food without any problems? Yes.    Have you been able to return to your normal activities? Yes.    Do you have any questions about your discharge instructions: Diet   No. Medications  No. Follow up visit  No.  Do you have questions or concerns about your Care? No.  Actions: * If pain score is 4 or above: No action needed, pain <4.  Have you developed a fever since your procedure? no  2.   Have you had an respiratory symptoms (SOB or cough) since your procedure? no  3.   Have you tested positive for COVID 19 since your procedure no  4.   Have you had any family members/close contacts diagnosed with the COVID 19 since your procedure?  no   If yes to any of these questions please route to Joylene John, RN and Joella Prince, RN

## 2022-02-27 ENCOUNTER — Inpatient Hospital Stay: Payer: Medicare Other | Attending: Oncology | Admitting: Oncology

## 2022-02-27 VITALS — BP 160/79 | HR 68 | Temp 98.2°F | Resp 20 | Ht 66.0 in | Wt 151.0 lb

## 2022-02-27 DIAGNOSIS — Z9221 Personal history of antineoplastic chemotherapy: Secondary | ICD-10-CM | POA: Diagnosis not present

## 2022-02-27 DIAGNOSIS — Z86718 Personal history of other venous thrombosis and embolism: Secondary | ICD-10-CM | POA: Insufficient documentation

## 2022-02-27 DIAGNOSIS — F17210 Nicotine dependence, cigarettes, uncomplicated: Secondary | ICD-10-CM | POA: Diagnosis not present

## 2022-02-27 DIAGNOSIS — C189 Malignant neoplasm of colon, unspecified: Secondary | ICD-10-CM

## 2022-02-27 DIAGNOSIS — Z86010 Personal history of colonic polyps: Secondary | ICD-10-CM | POA: Diagnosis not present

## 2022-02-27 DIAGNOSIS — Z85038 Personal history of other malignant neoplasm of large intestine: Secondary | ICD-10-CM | POA: Diagnosis present

## 2022-02-27 NOTE — Progress Notes (Signed)
[facility] OFFICE PROGRESS NOTE   Diagnosis: Colon cancer  INTERVAL HISTORY:   [name] returns as scheduled.  She feels well.  No difficulty with bowel function.  Her feet feel "cold ".  Her legs tire easily with ambulation.  No other complaint.  She underwent a colonoscopy 08/15/2021.  There was diverticulosis.  No polyps. She reports seeing ENT to evaluate the palate lesion.  No treatment was recommended.  She continues smoking intermittently.  Objective:  Vital signs in last 24 hours:  Blood pressure (!) 160/79, pulse 68, temperature 98.2 F (36.8 C), temperature source Tympanic, resp. rate 20, height '5\' 6"'$  (1.676 m), weight 151 lb (68.5 kg), SpO2 100 %.    HEENT: Raised lesion at the central palate Lymphatics: No cervical, supraclavicular, axillary, or inguinal nodes Resp: Distant breath sounds, lungs clear bilaterally, no respiratory distress Cardio: Regular rate and rhythm GI: No mass, no hepatosplenomegaly, nontender Vascular: No leg edema   Lab Results:  Lab Results  Component Value Date   WBC 7.9 08/07/2021   HGB 11.8 (L) 08/07/2021   HCT 36.8 08/07/2021   MCV 92.9 08/07/2021   PLT 313 08/07/2021   NEUTROABS 4.2 08/07/2021    CMP  Lab Results  Component Value Date   NA 138 08/07/2021   K 3.3 (L) 08/07/2021   CL 104 08/07/2021   CO2 26 08/07/2021   GLUCOSE 72 08/07/2021   BUN 13 08/07/2021   CREATININE 0.52 08/07/2021   CALCIUM 9.1 08/07/2021   PROT 7.3 08/07/2021   ALBUMIN 3.8 08/07/2021   AST 17 08/07/2021   ALT 17 08/07/2021   ALKPHOS 74 08/07/2021   BILITOT 1.1 08/07/2021   GFRNONAA >60 08/07/2021   GFRAA >60 04/10/2019    Lab Results  Component Value Date   CEA1 1.07 03/01/2020   CEA 0.8 10/13/2015    Medications: I have reviewed the patient's current medications.   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 Adjuvant  FOLFOX chemotherapy was initiated on 01/21/2012. The last cycle was completed on 06/30/2012. Restaging CT scans 09/30/2012 showed no evidence of recurrent/metastatic disease. -Surveillance colonoscopy 02/24/2013 with a single hyperplastic polyp. Next colonoscopy recommended at a 3 year interval.  -Surveillance CT scans 10/12/2013 with no evidence of metastatic disease   -Surveillance CT scans 10/14/2014 with no evidence of metastatic disease -Negative surveillance colonoscopy 03/30/2016  -Negative surveillance colonoscopy 02/28/2021 Delayed nausea following cycle 2 FOLFOX. Aloxi was added with cycle 3. Persistent delayed nausea. Emend was added with cycle 4. She noted improvement in the nausea following cycle 4. She declined steroid prophylaxis.   Tiny lung nodules seen on a CT of the chest 10/19/2011. Stable on CT of the chest 10/12/2013 and 10/14/2014 History of tobacco use Endometrial thickening on CT of the abdomen 10/02/2011. Progressive endometrial thickening on CT 09/30/2012. Status post removal of an endometrial polyp 02/03/2013 with benign pathology. Port-A-Cath placement 01/09/2012 in interventional radiology. Removed 12/30/2012. 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 The finger numbness has improved.   Mild thrombocytopenia secondary to chemotherapy-resolved. History of neutropenia secondary to chemotherapy. 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     Disposition: [name] remains in clinical remission from colon cancer.  I recommended she obtain a primary care physician.  She will to continue follow-up at the cancer center.  She will return for an office visit in 1 year.  We will refer her again for the  screening chest CT.    Betsy Coder, MD  02/27/2022  8:44 AM

## 2022-03-08 ENCOUNTER — Other Ambulatory Visit: Payer: Self-pay | Admitting: *Deleted

## 2022-03-08 DIAGNOSIS — Z87891 Personal history of nicotine dependence: Secondary | ICD-10-CM

## 2022-03-08 DIAGNOSIS — C189 Malignant neoplasm of colon, unspecified: Secondary | ICD-10-CM

## 2022-03-08 NOTE — Progress Notes (Signed)
Per Dr. Benay Spice request: placed referral for [facility] for Low Dose CT Lung cancer screening

## 2023-02-28 ENCOUNTER — Other Ambulatory Visit: Payer: Self-pay | Admitting: *Deleted

## 2023-02-28 ENCOUNTER — Inpatient Hospital Stay: Payer: Medicare HMO

## 2023-02-28 ENCOUNTER — Inpatient Hospital Stay: Payer: Medicare HMO | Attending: Oncology | Admitting: Oncology

## 2023-02-28 VITALS — BP 159/71 | HR 68 | Temp 98.1°F | Resp 18 | Ht 66.0 in | Wt 146.0 lb

## 2023-02-28 DIAGNOSIS — Z23 Encounter for immunization: Secondary | ICD-10-CM | POA: Diagnosis not present

## 2023-02-28 DIAGNOSIS — F17210 Nicotine dependence, cigarettes, uncomplicated: Secondary | ICD-10-CM | POA: Diagnosis not present

## 2023-02-28 DIAGNOSIS — Z85038 Personal history of other malignant neoplasm of large intestine: Secondary | ICD-10-CM | POA: Diagnosis present

## 2023-02-28 DIAGNOSIS — C189 Malignant neoplasm of colon, unspecified: Secondary | ICD-10-CM

## 2023-02-28 DIAGNOSIS — K1370 Unspecified lesions of oral mucosa: Secondary | ICD-10-CM | POA: Insufficient documentation

## 2023-02-28 DIAGNOSIS — Z86718 Personal history of other venous thrombosis and embolism: Secondary | ICD-10-CM | POA: Diagnosis not present

## 2023-02-28 DIAGNOSIS — R110 Nausea: Secondary | ICD-10-CM | POA: Insufficient documentation

## 2023-02-28 DIAGNOSIS — Z9221 Personal history of antineoplastic chemotherapy: Secondary | ICD-10-CM | POA: Insufficient documentation

## 2023-02-28 DIAGNOSIS — G620 Drug-induced polyneuropathy: Secondary | ICD-10-CM | POA: Diagnosis not present

## 2023-02-28 DIAGNOSIS — Z87891 Personal history of nicotine dependence: Secondary | ICD-10-CM

## 2023-02-28 MED ORDER — VITAMIN D (ERGOCALCIFEROL) 1.25 MG (50000 UNIT) PO CAPS: 50000.0000 [IU] | ORAL_CAPSULE | ORAL | 1 refills | Status: DC

## 2023-02-28 MED ORDER — INFLUENZA VAC A&B SA ADJ QUAD 0.5 ML IM PRSY: 0.5000 mL | PREFILLED_SYRINGE | Freq: Once | INTRAMUSCULAR | Status: DC

## 2023-02-28 MED ORDER — INFLUENZA VAC A&B SURF ANT ADJ 0.5 ML IM SUSY
0.5000 mL | PREFILLED_SYRINGE | INTRAMUSCULAR | Status: DC
  Filled 2023-02-28: qty 0.5

## 2023-02-28 MED ORDER — INFLUENZA VAC A&B SURF ANT ADJ 0.5 ML IM SUSY
0.5000 mL | PREFILLED_SYRINGE | INTRAMUSCULAR | Status: AC
  Administered 2023-02-28: 0.5 mL via INTRAMUSCULAR
  Filled 2023-02-28: qty 0.5

## 2023-02-28 NOTE — Progress Notes (Signed)
[facility] OFFICE PROGRESS NOTE   Diagnosis: Colon cancer  INTERVAL HISTORY:   [name] returns as scheduled.  She reports feeling well.  Good appetite.  She relates weight loss to exercise at work.  She has returned to work 2.5 days/week.  No difficulty with bowel function.  No bleeding.  She continues smoking.  She has been evaluated by dental medicine for the palate lesion.  Objective:  Vital signs in last 24 hours:  Blood pressure (!) 159/71, pulse 68, temperature 98.1 F (36.7 C), temperature source Oral, resp. rate 18, height 5\' 6"  (1.676 m), weight 146 lb (66.2 kg), SpO2 100%.    HEENT: Raised lesion at the central palate Lymphatics: No cervical, supraclavicular, axillary, or inguinal nodes Resp: Lungs clear bilaterally Cardio: Regular rate and rhythm GI: Nontender, no mass, no hepatosplenomegaly Vascular: No leg edema  Lab Results:  Lab Results  Component Value Date   WBC 7.9 08/07/2021   HGB 11.8 (L) 08/07/2021   HCT 36.8 08/07/2021   MCV 92.9 08/07/2021   PLT 313 08/07/2021   NEUTROABS 4.2 08/07/2021    CMP  Lab Results  Component Value Date   NA 138 08/07/2021   K 3.3 (L) 08/07/2021   CL 104 08/07/2021   CO2 26 08/07/2021   GLUCOSE 72 08/07/2021   BUN 13 08/07/2021   CREATININE 0.52 08/07/2021   CALCIUM 9.1 08/07/2021   PROT 7.3 08/07/2021   ALBUMIN 3.8 08/07/2021   AST 17 08/07/2021   ALT 17 08/07/2021   ALKPHOS 74 08/07/2021   BILITOT 1.1 08/07/2021   GFRNONAA >60 08/07/2021   GFRAA >60 04/10/2019    Lab Results  Component Value Date   CEA1 1.07 03/01/2020   CEA 0.8 10/13/2015     Medications: I have reviewed the patient's current medications.   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 Adjuvant FOLFOX chemotherapy was initiated on 01/21/2012. The last cycle was completed on 06/30/2012. Restaging CT scans 09/30/2012  showed no evidence of recurrent/metastatic disease. -Surveillance colonoscopy 02/24/2013 with a single hyperplastic polyp. Next colonoscopy recommended at a 3 year interval.  -Surveillance CT scans 10/12/2013 with no evidence of metastatic disease   -Surveillance CT scans 10/14/2014 with no evidence of metastatic disease -Negative surveillance colonoscopy 03/30/2016  -Negative surveillance colonoscopy 02/28/2021 -Negative colonoscopy 08/15/2021 Delayed nausea following cycle 2 FOLFOX. Aloxi was added with cycle 3. Persistent delayed nausea. Emend was added with cycle 4. She noted improvement in the nausea following cycle 4. She declined steroid prophylaxis.   Tiny lung nodules seen on a CT of the chest 10/19/2011. Stable on CT of the chest 10/12/2013 and 10/14/2014 History of tobacco use Endometrial thickening on CT of the abdomen 10/02/2011. Progressive endometrial thickening on CT 09/30/2012. Status post removal of an endometrial polyp 02/03/2013 with benign pathology. Port-A-Cath placement 01/09/2012 in interventional radiology. Removed 12/30/2012. 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 The finger numbness has improved.   Mild thrombocytopenia secondary to chemotherapy-resolved. History of neutropenia secondary to chemotherapy. 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       Disposition: [name] mains in remission from colon cancer.  She would like to continue follow-up at the Cancer center.  She will return for an office visit in 1 year.  I encouraged her to discontinue smoking.  She agrees to a referral to the lung cancer screening clinic.  She will obtain a primary care provider. [name]  received an influenza vaccine today. Thornton Papas, MD  02/28/2023  8:07 AM

## 2023-04-10 ENCOUNTER — Telehealth: Payer: Self-pay | Admitting: *Deleted

## 2023-04-10 NOTE — Telephone Encounter (Signed)
Called [facility] to f/u on referral to lung cancer screening clinic. They have left her a message and she has not called back. Call [name] and left VM with phone #[phone] of [facility] to call if she is still interested in going.

## 2023-06-13 ENCOUNTER — Ambulatory Visit (HOSPITAL_BASED_OUTPATIENT_CLINIC_OR_DEPARTMENT_OTHER): Payer: Medicare HMO | Admitting: Family Medicine

## 2023-06-17 ENCOUNTER — Ambulatory Visit: Payer: Medicare HMO | Admitting: Urgent Care

## 2023-08-01 ENCOUNTER — Encounter (HOSPITAL_BASED_OUTPATIENT_CLINIC_OR_DEPARTMENT_OTHER): Payer: Self-pay | Admitting: Family Medicine

## 2023-08-01 ENCOUNTER — Ambulatory Visit (INDEPENDENT_AMBULATORY_CARE_PROVIDER_SITE_OTHER): Payer: Medicare HMO | Admitting: Family Medicine

## 2023-08-01 VITALS — BP 126/72 | HR 73 | Ht 65.0 in | Wt 140.6 lb

## 2023-08-01 DIAGNOSIS — N840 Polyp of corpus uteri: Secondary | ICD-10-CM | POA: Insufficient documentation

## 2023-08-01 DIAGNOSIS — E7800 Pure hypercholesterolemia, unspecified: Secondary | ICD-10-CM | POA: Insufficient documentation

## 2023-08-01 DIAGNOSIS — I10 Essential (primary) hypertension: Secondary | ICD-10-CM | POA: Diagnosis not present

## 2023-08-01 NOTE — Assessment & Plan Note (Signed)
 Noted on history, blood pressure at goal in office today Can continue to monitor intermittently at home and we will follow-up on readings in the office

## 2023-08-01 NOTE — Progress Notes (Signed)
 New Patient Office Visit  Subjective   Patient ID: [name], female    DOB: [birth year]  Age: 78 y.o. MRN: [id number]  CC:  Chief Complaint  Patient presents with   New Patient (Initial Visit)    New patient just establishing care was referred by cancer dr     HPI [name] presents to establish care Last PCP - none  Colon cancer: She does follow with [name].  Diagnosed as stage IIIa adenocarcinoma of distal transverse colon/5 flexor.  She did have partial colectomy in June 2013.  Last visit with oncology was in September 2024 with plan for 1 year follow-up.  Tobacco use: She does currently smoke.  She was referred to lung cancer screening clinic with pulmonology by [name].  No concerns today. Reports that she occasionally will get headaches, takes Tylenol  for this as needed. Chart indicates history of HTN. Slightly low potassium on last labs. Slight elevation in LDL on labs in the past.   Patient is originally from [place]. Has lived here for about 30 years, spent some time back in [place] during that time. She does still work armed forces operational officer, works as residence technical sales engineer at [employer]. She enjoys playing bingo, watching sports.  Outpatient Encounter Medications as of 08/01/2023  Medication Sig   Multiple Vitamin (MULTIVITAMIN) tablet Take 1 tablet by mouth daily.   Vitamin D , Ergocalciferol , (DRISDOL ) 1.25 MG (50000 UNIT) CAPS capsule Take 1 capsule (50,000 Units total) by mouth once a week. Future refills per PCP   atorvastatin (LIPITOR) 10 MG tablet Take 10 mg by mouth daily. (Patient not taking: Reported on 08/01/2023)   hydrOXYzine (ATARAX) 10 MG tablet Take 10 mg by mouth at bedtime. (Patient not taking: Reported on 08/01/2023)   prochlorperazine  (COMPAZINE ) 10 MG tablet  (Patient not taking: Reported on 08/01/2023)   No facility-administered encounter medications on file as of 08/01/2023.    Past Medical History:  Diagnosis Date   Abdominal pain    Allergy    Anemia     Blood transfusion without reported diagnosis 1980   Cancer ([facility]) 11/2011   cancer in colon   Colon cancer ([facility]) 12/18/2011   DVT (deep venous thrombosis) ([facility]) 05/2012   (R)IJ and subclavian, stopped Coumadin  July 2014   History of kidney stones    Irregular heart rate    since child    Past Surgical History:  Procedure Laterality Date   CHOLECYSTECTOMY     COLON RESECTION  12/18/2011   Procedure: COLON RESECTION LAPAROSCOPIC;  Surgeon: [name], MD;  Location: [facility];  Service: General;  Laterality: N/A;   COLON SURGERY     COLONOSCOPY     DILATATION & CURRETTAGE/HYSTEROSCOPY WITH RESECTOCOPE N/A 02/03/2013   Procedure: DILATATION & CURETTAGE/HYSTEROSCOPY, removal  of endometrial mass;  Surgeon: [name], MD;  Location: [facility];  Service: Gynecology;  Laterality: N/A;   goiter removal      KIDNEY STONE SURGERY     PORTACATH PLACEMENT  11/2011   12 treatments of chemo/ last treatment 2014   portacath removal  12/30/12   TONSILLECTOMY     as child    Family History  Problem Relation Age of Onset   Breast cancer Mother    Heart disease Mother    Hypertension Other    Colon polyps Neg Hx    Esophageal cancer Neg Hx    Stomach cancer Neg Hx    Rectal cancer Neg Hx    Colon  cancer Neg Hx     Social History   Socioeconomic History   Marital status: Widowed    Spouse name: Not on file   Number of children: 4   Years of education: Not on file   Highest education level: Not on file  Occupational History   Occupation: Resident Location Manager: [employer]  Tobacco Use   Smoking status: Some Days    Current packs/day: 0.25    Average packs/day: 0.3 packs/day for 35.0 years (8.8 ttl pk-yrs)    Types: Cigarettes    Passive exposure: Current   Smokeless tobacco: Never   Tobacco comments:    smoke 2 cigarettes a day/occasional  Vaping Use   Vaping status: Never Used  Substance and Sexual Activity   Alcohol use: No   Drug use: No   Sexual activity:  Not on file  Other Topics Concern   Not on file  Social History Narrative   ** Merged History Encounter **       Widowed, currently lives with son, [name]   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      Children:   [name]   [name]   [name]-daughter-in-law   [name]   Social Drivers of Health   Financial Resource Strain: Not on file  Food Insecurity: Not on file  Transportation Needs: Not on file  Physical Activity: Not on file  Stress: Not on file  Social Connections: Not on file  Intimate Partner Violence: Not on file    Objective   BP 126/72 (BP Location: Left Arm, Patient Position: Sitting, Cuff Size: Normal)   Pulse 73   Ht 5' 5 (1.651 m)   Wt 140 lb 9.6 oz (63.8 kg)   SpO2 98%   BMI 23.40 kg/m   Physical Exam  78 year old female in no acute distress Cardiovascular exam with regular rate and rhythm Lungs clear to auscultation bilaterally  Assessment & Plan:   Primary hypertension Assessment & Plan: Noted on history, blood pressure at goal in office today Can continue to monitor intermittently at home and we will follow-up on readings in the office  Orders: -     CBC with Differential/Platelet; Future -     Comprehensive metabolic panel; Future -     Lipid panel; Future  Elevated LDL cholesterol level Assessment & Plan: Noted in the past, we can recheck labs to assess current status prior to next office visit  Orders: -     Lipid panel; Future  Return in about 6 months (around 01/29/2024).    ___________________________________________ Robin Por[place], MD, ABFM, CAQSM [facility]

## 2023-08-01 NOTE — Assessment & Plan Note (Signed)
 Noted in the past, we can recheck labs to assess current status prior to next office visit

## 2023-08-01 NOTE — Patient Instructions (Signed)
  Medication Instructions:  Your physician recommends that you continue on your current medications as directed. Please refer to the Current Medication list given to you today. --If you need a refill on any your medications before your next appointment, please call your pharmacy first. If no refills are authorized on file call the office.-- Lab Work: Your physician has recommended that you have lab work today: 1 week before next visit  If you have labs (blood work) drawn today and your tests are completely normal, you will receive your results via MyChart message OR a phone call from our staff.  Please ensure you check your voicemail in the event that you authorized detailed messages to be left on a delegated number. If you have any lab test that is abnormal or we need to change your treatment, we will call you to review the results.   Follow-Up: Your next appointment:   Your physician recommends that you schedule a follow-up appointment in: 4-6 month follow up  with Dr. de [place]  You will receive a text message or e-mail with a link to a survey about your care and experience with [place] today! We would greatly appreciate your feedback!   Thanks for letting us be apart of your health journey!!  Primary Care and Sports Medicine   Dr. Ceasar Mons [place]   We encourage you to activate your patient portal called "MyChart".  Sign up information is provided on this After Visit Summary.  MyChart is used to connect with patients for Virtual Visits (Telemedicine).  Patients are able to view lab/test results, encounter notes, upcoming appointments, etc.  Non-urgent messages can be sent to your provider as well. To learn more about what you can do with MyChart, please visit --  [email or web address].

## 2023-08-07 ENCOUNTER — Encounter (HOSPITAL_BASED_OUTPATIENT_CLINIC_OR_DEPARTMENT_OTHER): Payer: Self-pay

## 2023-08-07 ENCOUNTER — Ambulatory Visit (HOSPITAL_BASED_OUTPATIENT_CLINIC_OR_DEPARTMENT_OTHER): Payer: Medicare HMO | Admitting: *Deleted

## 2023-08-07 VITALS — Ht 65.0 in | Wt 143.0 lb

## 2023-08-07 DIAGNOSIS — Z0000 Encounter for general adult medical examination without abnormal findings: Secondary | ICD-10-CM | POA: Diagnosis not present

## 2023-08-07 NOTE — Patient Instructions (Signed)
Ms. Lentz , Thank you for taking time to come for your Medicare Wellness Visit. I appreciate your ongoing commitment to your health goals. Please review the following plan we discussed and let me know if I can assist you in the future.   Referrals/Orders/Follow-Ups/Clinician Recommendations: No referrals placed today  This is a list of the screening recommended for you and due dates:  Health Maintenance  Topic Date Due   DTaP/Tdap/Td vaccine (1 - Tdap) Never done   Zoster (Shingles) Vaccine (1 of 2) Never done   DEXA scan (bone density measurement)  Never done   Pneumonia Vaccine (2 of 2 - PPSV23 or PCV20) 05/27/2017   COVID-19 Vaccine (5 - 2024-25 season) 02/24/2023   Medicare Annual Wellness Visit  08/06/2024   Colon Cancer Screening  08/15/2026   Flu Shot  Completed   Hepatitis C Screening  Completed   HPV Vaccine  Aged Out    Advanced directives: (Declined) Advance directive discussed with you today. Even though you declined this today, please call our office should you change your mind, and we can give you the proper paperwork for you to fill out.  Next Medicare Annual Wellness Visit scheduled for next year: No

## 2023-08-07 NOTE — Progress Notes (Signed)
Subjective:   [name] is a 78 y.o. female who presents for Medicare Annual (Subsequent) preventive examination.  Visit Complete: Virtual I connected with  [name] on 08/07/23 by a audio enabled telemedicine application and verified that I am speaking with the correct person using two identifiers.  Patient Location: Other:  At family restaurant  Provider Location: Office/Clinic  I discussed the limitations of evaluation and management by telemedicine. The patient expressed understanding and agreed to proceed.  Vital Signs: Because this visit was a virtual/telehealth visit, some criteria may be missing or patient reported. Any vitals not documented were not able to be obtained and vitals that have been documented are patient reported.  Patient Medicare AWV questionnaire was completed by the patient on 08/07/23; I have confirmed that all information answered by patient is correct and no changes since this date.  Cardiac Risk Factors include: smoking/ tobacco exposure     Objective:    Today's Vitals   08/07/23 0812  Weight: 143 lb (64.9 kg)  Height: 5\' 5"  (1.651 m)  PainSc: 0-No pain   Body mass index is 23.8 kg/m.     08/07/2023    8:24 AM 02/28/2023    8:02 AM 02/27/2022    8:19 AM 08/07/2021    1:50 PM 02/28/2021    7:59 AM 03/01/2020    8:50 AM 04/10/2019    5:52 AM  Advanced Directives  Does Patient Have a Medical Advance Directive? No No Yes No No No No  Does patient want to make changes to medical advance directive?  Yes (MAU/Ambulatory/Procedural Areas - Information given) Yes (MAU/Ambulatory/Procedural Areas - Information given)      Would patient like information on creating a medical advance directive? No - Patient declined   No - Patient declined Yes (MAU/Ambulatory/Procedural Areas - Information given) No - Patient declined     Current Medications (verified) Outpatient Encounter Medications as of 08/07/2023  Medication Sig   atorvastatin (LIPITOR) 10  MG tablet Take 10 mg by mouth daily. (Patient not taking: Reported on 08/07/2023)   hydrOXYzine (ATARAX) 10 MG tablet Take 10 mg by mouth at bedtime. (Patient not taking: Reported on 08/07/2023)   Multiple Vitamin (MULTIVITAMIN) tablet Take 1 tablet by mouth daily. (Patient not taking: Reported on 08/07/2023)   Vitamin D, Ergocalciferol, (DRISDOL) 1.25 MG (50000 UNIT) CAPS capsule Take 1 capsule (50,000 Units total) by mouth once a week. Future refills per PCP (Patient not taking: Reported on 08/07/2023)   [DISCONTINUED] prochlorperazine (COMPAZINE) 10 MG tablet  (Patient not taking: Reported on 08/01/2023)   No facility-administered encounter medications on file as of 08/07/2023.    Allergies (verified) Aspirin, Advil [ibuprofen], Aleve [naproxen sodium], and Penicillins   History: Past Medical History:  Diagnosis Date   Abdominal pain    Allergy    Anemia    Blood transfusion without reported diagnosis 1980   Cancer (HCC) 11/2011   cancer in colon   Colon cancer (HCC) 12/18/2011   DVT (deep venous thrombosis) (HCC) 05/2012   (R)IJ and subclavian, stopped Coumadin July 2014   History of kidney stones    Irregular heart rate    since child   Past Surgical History:  Procedure Laterality Date   CHOLECYSTECTOMY     COLON RESECTION  12/18/2011   Procedure: COLON RESECTION LAPAROSCOPIC;  Surgeon: Maisie Fus A. Cornett, MD;  Location: [facility] OR;  Service: General;  Laterality: N/A;   COLON SURGERY     COLONOSCOPY     DILATATION &  CURRETTAGE/HYSTEROSCOPY WITH RESECTOCOPE N/A 02/03/2013   Procedure: DILATATION & CURETTAGE/HYSTEROSCOPY, removal  of endometrial mass;  Surgeon: Mickel Baas, MD;  Location: [facility];  Service: Gynecology;  Laterality: N/A;   goiter removal      KIDNEY STONE SURGERY     PORTACATH PLACEMENT  11/2011   12 treatments of chemo/ last treatment 2014   portacath removal  12/30/12   TONSILLECTOMY     as child   Family History  Problem Relation Age of Onset   Breast cancer  Mother    Heart disease Mother    Hypertension Other    Colon polyps Neg Hx    Esophageal cancer Neg Hx    Stomach cancer Neg Hx    Rectal cancer Neg Hx    Colon cancer Neg Hx    Social History   Socioeconomic History   Marital status: Widowed    Spouse name: Not on file   Number of children: 4   Years of education: Not on file   Highest education level: Not on file  Occupational History   Occupation: Resident Location manager: [employer]  Tobacco Use   Smoking status: Some Days    Current packs/day: 0.25    Average packs/day: 0.3 packs/day for 35.0 years (8.8 ttl pk-yrs)    Types: Cigarettes    Passive exposure: Current   Smokeless tobacco: Never   Tobacco comments:    smoke 2 cigarettes a day/occasional  Vaping Use   Vaping status: Never Used  Substance and Sexual Activity   Alcohol use: No   Drug use: No   Sexual activity: Not on file  Other Topics Concern   Not on file  Social History Narrative   ** Merged History Encounter **       Widowed, currently lives with son, [name]   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      Children:   [name]   [name]   [name]   [name]   Social Drivers of Health   Financial Resource Strain: Medium Risk (08/07/2023)   Overall Financial Resource Strain (CARDIA)    Difficulty of Paying Living Expenses: Somewhat hard  Food Insecurity: Food Insecurity Present (08/07/2023)   Hunger Vital Sign    Worried About Running Out of Food in the Last Year: Sometimes true    Ran Out of Food in the Last Year: Sometimes true  Transportation Needs: No Transportation Needs (08/07/2023)   PRAPARE - Administrator, Civil Service (Medical): No    Lack of Transportation (Non-Medical): No  Physical Activity: Insufficiently Active (08/07/2023)   Exercise Vital Sign    Days of Exercise per Week: 2 days    Minutes of Exercise per Session: 60 min  Stress: No  Stress Concern Present (08/07/2023)   [employer] of Occupational Health - Occupational Stress Questionnaire    Feeling of Stress : Not at all  Social Connections: Moderately Isolated (08/07/2023)   Social Connection and Isolation Panel [NHANES]    Frequency of Communication with Friends and Family: More than three times a week    Frequency of Social Gatherings with Friends and Family: More than three times a week    Attends Religious Services: 1 to 4 times per year    Active Member of [employer] or Organizations: No    Attends Banker Meetings: Never    Marital Status:  Widowed    Tobacco Counseling Ready to quit: Not Answered Counseling given: Not Answered Tobacco comments: smoke 2 cigarettes a day/occasional   Clinical Intake:  Pre-visit preparation completed: Yes  Pain Score: 0-No pain     BMI - recorded: 23.8 Nutritional Status: BMI of 19-24  Normal Nutritional Risks: None Diabetes: No  How often do you need to have someone help you when you read instructions, pamphlets, or other written materials from your doctor or pharmacy?: 1 - Never What is the last grade level you completed in school?: Some college  Interpreter Needed?: No  Information entered by :: Cristy Hilts, CMA   Activities of Daily Living    08/07/2023    8:15 AM  In your present state of health, do you have any difficulty performing the following activities:  Hearing? 0  Vision? 0  Difficulty concentrating or making decisions? 0  Walking or climbing stairs? 0  Dressing or bathing? 0  Doing errands, shopping? 0  Preparing Food and eating ? N  Using the Toilet? N  In the past six months, have you accidently leaked urine? N  Do you have problems with loss of bowel control? N  Managing your Medications? N  Managing your Finances? N  Housekeeping or managing your Housekeeping? N    Patient Care Team: de [place], Buren Kos, MD as PCP - General (Family Medicine) Harriette Bouillon, MD as  Surgeon (General Surgery) Rachael Fee, MD (Inactive) as Consulting Physician (Gastroenterology)  Indicate any recent Medical Services you may have received from other than [facility] providers in the past year (date may be approximate).     Assessment:   This is a routine wellness examination for Samantha.  Hearing/Vision screen No results found.   Goals Addressed               This Visit's Progress     Have 3 meals a day (pt-stated)        Patient is wanting to try to eat better to be able to gain weight back.      Depression Screen    08/07/2023    8:22 AM 08/01/2023    8:56 AM 04/30/2018    8:41 AM  PHQ 2/9 Scores  PHQ - 2 Score 0 0 0  PHQ- 9 Score 0 0     Fall Risk    08/07/2023    8:22 AM 08/01/2023    8:55 AM 04/30/2018    8:41 AM 04/16/2014    9:22 AM  Fall Risk   Falls in the past year? 0 0 0 No  Number falls in past yr: 0 0    Injury with Fall? 0 0    Risk for fall due to : No Fall Risks No Fall Risks    Follow up Falls evaluation completed Falls evaluation completed      MEDICARE RISK AT HOME: Medicare Risk at Home Any stairs in or around the home?: Yes If so, are there any without handrails?: Yes Home free of loose throw rugs in walkways, pet beds, electrical cords, etc?: Yes Adequate lighting in your home to reduce risk of falls?: Yes Life alert?: No Use of a cane, walker or w/c?: No Grab bars in the bathroom?: No Shower chair or bench in shower?: No Elevated toilet seat or a handicapped toilet?: No  TIMED UP AND GO:  Was the test performed?  No    Cognitive Function:        08/07/2023  8:25 AM  6CIT Screen  What Year? 0 points  What month? 0 points  What time? 0 points  Count back from 20 0 points  Months in reverse 4 points  Repeat phrase 4 points  Total Score 8 points    Immunizations Immunization History  Administered Date(s) Administered   Fluad Quad(high Dose 65+) 03/09/2019, 03/15/2020, 04/16/2022   Fluad Trivalent(High  Dose 65+) 02/28/2023   Influenza, High Dose Seasonal PF 04/01/2017   Influenza,inj,Quad PF,6+ Mos 03/20/2013, 04/16/2014, 04/19/2015, 03/26/2016, 04/14/2018   Moderna Sars-Covid-2 Vaccination 08/06/2019, 09/08/2019, 06/23/2020   Pneumococcal Conjugate-13 04/01/2017   Unspecified SARS-COV-2 Vaccination 04/16/2022    TDAP status: Due, Education has been provided regarding the importance of this vaccine. Advised may receive this vaccine at local pharmacy or Health Dept. Aware to provide a copy of the vaccination record if obtained from local pharmacy or Health Dept. Verbalized acceptance and understanding.  Flu Vaccine status: Up to date  Pneumococcal vaccine status: Due, Education has been provided regarding the importance of this vaccine. Advised may receive this vaccine at local pharmacy or Health Dept. Aware to provide a copy of the vaccination record if obtained from local pharmacy or Health Dept. Verbalized acceptance and understanding.  Covid-19 vaccine status: Completed vaccines  Qualifies for Shingles Vaccine? Yes   Zostavax completed No   Shingrix Completed?: No.    Education has been provided regarding the importance of this vaccine. Patient has been advised to call insurance company to determine out of pocket expense if they have not yet received this vaccine. Advised may also receive vaccine at local pharmacy or Health Dept. Verbalized acceptance and understanding.  Screening Tests Health Maintenance  Topic Date Due   DTaP/Tdap/Td (1 - Tdap) Never done   Zoster Vaccines- Shingrix (1 of 2) Never done   DEXA SCAN  Never done   Pneumonia Vaccine 59+ Years old (2 of 2 - PPSV23 or PCV20) 05/27/2017   COVID-19 Vaccine (5 - 2024-25 season) 02/24/2023   Medicare Annual Wellness (AWV)  08/06/2024   Colonoscopy  08/15/2026   INFLUENZA VACCINE  Completed   Hepatitis C Screening  Completed   HPV VACCINES  Aged Out    Health Maintenance  Health Maintenance Due  Topic Date Due    DTaP/Tdap/Td (1 - Tdap) Never done   Zoster Vaccines- Shingrix (1 of 2) Never done   DEXA SCAN  Never done   Pneumonia Vaccine 56+ Years old (2 of 2 - PPSV23 or PCV20) 05/27/2017   COVID-19 Vaccine (5 - 2024-25 season) 02/24/2023   Patient declines having order placed for mammogram and bone density scan at this time.  Lung Cancer Screening: (Low Dose CT Chest recommended if Age 29-80 years, 20 pack-year currently smoking OR have quit w/in 15years.) does qualify.   Patient declines having a lung cancer screen CT performed.  Additional Screening:  Hepatitis C Screening: does not qualify  Vision Screening: Recommended annual ophthalmology exams for early detection of glaucoma and other disorders of the eye. Is the patient up to date with their annual eye exam?  No  Who is the provider or what is the name of the office in which the patient attends annual eye exams? [facility] If pt is not established with a provider, would they like to be referred to a provider to establish care? No .   Dental Screening: Recommended annual dental exams for proper oral hygiene   Community Resource Referral / Chronic Care Management: CRR required this visit?  No  CCM required this visit?  No     Plan:     I have personally reviewed and noted the following in the patient's chart:   Medical and social history Use of alcohol, tobacco or illicit drugs  Current medications and supplements including opioid prescriptions. Patient is not currently taking opioid prescriptions. Functional ability and status Nutritional status Physical activity Advanced directives List of other physicians Hospitalizations, surgeries, and ER visits in previous 12 months Vitals Screenings to include cognitive, depression, and falls Referrals and appointments  In addition, I have reviewed and discussed with patient certain preventive protocols, quality metrics, and best practice recommendations. A written  personalized care plan for preventive services as well as general preventive health recommendations were provided to patient.     Ozil Stettler, Farley Ly, CMA   08/07/2023   After Visit Summary: (MyChart) Due to this being a telephonic visit, the after visit summary with patients personalized plan was offered to patient via MyChart

## 2023-08-17 ENCOUNTER — Emergency Department (HOSPITAL_COMMUNITY): Payer: Medicare Other

## 2023-08-17 ENCOUNTER — Emergency Department (HOSPITAL_BASED_OUTPATIENT_CLINIC_OR_DEPARTMENT_OTHER): Payer: Medicare Other

## 2023-08-17 ENCOUNTER — Encounter (HOSPITAL_BASED_OUTPATIENT_CLINIC_OR_DEPARTMENT_OTHER): Payer: Self-pay

## 2023-08-17 ENCOUNTER — Emergency Department (HOSPITAL_BASED_OUTPATIENT_CLINIC_OR_DEPARTMENT_OTHER)
Admission: EM | Admit: 2023-08-17 | Discharge: 2023-08-17 | Disposition: A | Discharge status: Home / Self Care | Payer: Medicare Other

## 2023-08-17 ENCOUNTER — Ambulatory Visit
Admission: EM | Admit: 2023-08-17 | Discharge: 2023-08-17 | Disposition: A | Discharge status: Home / Self Care | Payer: Self-pay | Attending: Family Medicine | Admitting: Family Medicine

## 2023-08-17 DIAGNOSIS — S134XXA Sprain of ligaments of cervical spine, initial encounter: Secondary | ICD-10-CM

## 2023-08-17 DIAGNOSIS — R918 Other nonspecific abnormal finding of lung field: Secondary | ICD-10-CM | POA: Diagnosis not present

## 2023-08-17 DIAGNOSIS — R930 Abnormal findings on diagnostic imaging of skull and head, not elsewhere classified: Secondary | ICD-10-CM | POA: Insufficient documentation

## 2023-08-17 DIAGNOSIS — M542 Cervicalgia: Secondary | ICD-10-CM | POA: Diagnosis not present

## 2023-08-17 DIAGNOSIS — R42 Dizziness and giddiness: Secondary | ICD-10-CM

## 2023-08-17 DIAGNOSIS — Y92410 Unspecified street and highway as the place of occurrence of the external cause: Secondary | ICD-10-CM | POA: Insufficient documentation

## 2023-08-17 LAB — BASIC METABOLIC PANEL
Anion gap: 8 (ref 5–15)
BUN: 14 mg/dL (ref 8–23)
CO2: 31 mmol/L (ref 22–32)
Calcium: 9.8 mg/dL (ref 8.9–10.3)
Chloride: 103 mmol/L (ref 98–111)
Creatinine, Ser: 0.8 mg/dL (ref 0.44–1.00)
GFR, Estimated: >60 mL/min (ref >60)
Glucose, Bld: 107 mg/dL — ABNORMAL HIGH (ref 70–99)
Potassium: 3.2 mmol/L — ABNORMAL LOW (ref 3.5–5.1)
Sodium: 142 mmol/L (ref 135–145)

## 2023-08-17 LAB — TROPONIN I (HIGH SENSITIVITY)
Troponin I (High Sensitivity): 8 ng/L (ref <18)
Troponin I (High Sensitivity): 7 ng/L (ref <18)

## 2023-08-17 LAB — CBC
HCT: 36.6 % (ref 36.0–46.0)
Hemoglobin: 12.2 g/dL (ref 12.0–15.0)
MCH: 30.5 pg (ref 26.0–34.0)
MCHC: 33.3 g/dL (ref 30.0–36.0)
MCV: 91.5 fL (ref 80.0–100.0)
Platelets: 241 10*3/uL (ref 150–400)
RBC: 4 MIL/uL (ref 3.87–5.11)
RDW: 12.8 % (ref 11.5–15.5)
WBC: 5.6 10*3/uL (ref 4.0–10.5)
nRBC: 0 % (ref 0.0–0.2)

## 2023-08-17 LAB — CBG MONITORING, ED: Glucose-Capillary: 107 mg/dL — ABNORMAL HIGH (ref 70–99)

## 2023-08-17 MED ORDER — IOHEXOL 300 MG/ML  SOLN
75.0000 mL | Freq: Once | INTRAMUSCULAR | Status: AC | PRN
  Administered 2023-08-17: 75 mL via INTRAVENOUS

## 2023-08-17 MED ORDER — POTASSIUM CHLORIDE CRYS ER 20 MEQ PO TBCR
40.0000 meq | EXTENDED_RELEASE_TABLET | Freq: Once | ORAL | Status: AC
  Administered 2023-08-17: 40 meq via ORAL
  Filled 2023-08-17: qty 2

## 2023-08-17 MED ORDER — ACETAMINOPHEN 500 MG PO TABS
1000.0000 mg | ORAL_TABLET | Freq: Once | ORAL | Status: AC
  Administered 2023-08-17: 1000 mg via ORAL
  Filled 2023-08-17: qty 2

## 2023-08-17 MED ORDER — LIDOCAINE 5 % EX PTCH
1.0000 | MEDICATED_PATCH | Freq: Once | CUTANEOUS | Status: DC
  Administered 2023-08-17: 2 via TRANSDERMAL
  Filled 2023-08-17: qty 2

## 2023-08-17 MED ORDER — GADOBUTROL 1 MMOL/ML IV SOLN
7.0000 mL | Freq: Once | INTRAVENOUS | Status: AC | PRN
  Administered 2023-08-17: 7 mL via INTRAVENOUS

## 2023-08-17 MED ORDER — IOHEXOL 350 MG/ML SOLN
75.0000 mL | Freq: Once | INTRAVENOUS | Status: AC | PRN
  Administered 2023-08-17: 75 mL via INTRAVENOUS

## 2023-08-17 NOTE — ED Notes (Signed)
 2nd troponin drawn and specimen to lab
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
## 2023-08-17 NOTE — ED Notes (Signed)
 Patient to CT via w/c

## 2023-08-17 NOTE — Discharge Instructions (Signed)
 Please go to the emergency room for further evaluation of your dizziness

## 2023-08-17 NOTE — ED Notes (Signed)
 Spoke with lab to add troponin to labs already collected.

## 2023-08-17 NOTE — ED Notes (Signed)
 Iv secured for pov transfer to [place] ed  for MRI.
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
## 2023-08-17 NOTE — ED Notes (Signed)
 Spoke with  lab Ellan Lambert to confirm trop order and blood received
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
## 2023-08-20 ENCOUNTER — Ambulatory Visit (HOSPITAL_BASED_OUTPATIENT_CLINIC_OR_DEPARTMENT_OTHER): Payer: Self-pay | Admitting: Family Medicine
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
## 2023-08-21 NOTE — Telephone Encounter (Signed)
 Called patient to advise she will keep the appointment on Friday as she has been out of work all week

## 2023-08-22 ENCOUNTER — Telehealth: Payer: Self-pay | Admitting: Family Medicine

## 2023-08-22 NOTE — Telephone Encounter (Signed)
 Left message on patient's voicemail to advise that her HFU appointment scheduled with our office needs to be rescheduled; advised patient to contact her PCP as soon as possible to reschedule.   Also sent patient a message via MyChart. This morning I called the office of Dr. Ceasar Mons [place] to bring this to their attention and requested for someone to reach out to the patient.

## 2023-08-22 NOTE — Telephone Encounter (Signed)
 Copied from CRM [id number]. Topic: Appointments - Appointment Cancel/Reschedule >> Aug 22, 2023 10:06 AM Shelah Lewandowsky wrote: Patient was scheduled with Olena Leatherwood for hospital follow up, [employer] called to see if the patient could be seen by your office since this is a Dr Tommi Rumps [place] patient, Please call patient to schedule with your office, she was scheduled for 2/28 10:00

## 2023-08-23 ENCOUNTER — Inpatient Hospital Stay: Payer: Medicare HMO | Admitting: Family Medicine

## 2023-08-27 ENCOUNTER — Telehealth (HOSPITAL_BASED_OUTPATIENT_CLINIC_OR_DEPARTMENT_OTHER): Payer: Self-pay | Admitting: Family Medicine
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
## 2023-09-05 ENCOUNTER — Ambulatory Visit (HOSPITAL_BASED_OUTPATIENT_CLINIC_OR_DEPARTMENT_OTHER): Admitting: Family Medicine

## 2023-09-10 ENCOUNTER — Ambulatory Visit (INDEPENDENT_AMBULATORY_CARE_PROVIDER_SITE_OTHER): Admitting: Family Medicine

## 2023-09-10 ENCOUNTER — Ambulatory Visit (HOSPITAL_BASED_OUTPATIENT_CLINIC_OR_DEPARTMENT_OTHER): Admitting: Family Medicine

## 2023-09-10 ENCOUNTER — Telehealth: Payer: Self-pay | Admitting: *Deleted

## 2023-09-10 ENCOUNTER — Encounter (HOSPITAL_BASED_OUTPATIENT_CLINIC_OR_DEPARTMENT_OTHER): Payer: Self-pay | Admitting: Family Medicine

## 2023-09-10 VITALS — BP 150/70 | HR 65 | Ht 65.0 in | Wt 142.8 lb

## 2023-09-10 DIAGNOSIS — M542 Cervicalgia: Secondary | ICD-10-CM | POA: Diagnosis not present

## 2023-09-10 DIAGNOSIS — R918 Other nonspecific abnormal finding of lung field: Secondary | ICD-10-CM | POA: Insufficient documentation
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
## 2023-09-10 NOTE — Assessment & Plan Note (Signed)
 Noted on recent CT scan in the emergency department with concern for metastatic disease given patient's history.  Her next appointment with oncology is not until September. Patient has previously been referred to lung cancer screening clinic, however has not scheduled as of yet.  We again reviewed this in the office and unfortunately, patient is dismissive regarding this referral and evaluation. Discussed that she will need to be seen by Dr. Truett Perna in oncology for sooner follow-up given CT scan findings.  She indicates that she will stop by his office today to schedule follow-up appointment

## 2023-09-10 NOTE — Patient Instructions (Signed)
  Medication Instructions:  Your physician recommends that you continue on your current medications as directed. Please refer to the Current Medication list given to you today. --If you need a refill on any your medications before your next appointment, please call your pharmacy first. If no refills are authorized on file call the office.-- Lab Work: Your physician has recommended that you have lab work today: no If you have labs (blood work) drawn today and your tests are completely normal, you will receive your results via MyChart message OR a phone call from our staff.  Please ensure you check your voicemail in the event that you authorized detailed messages to be left on a delegated number. If you have any lab test that is abnormal or we need to change your treatment, we will call you to review the results.  Referrals/Procedures/Imaging: yes  Follow-Up: Your next appointment:   Your physician recommends that you schedule a follow-up appointment in: 2-3 months with Dr. de [place]  You will receive a text message or e-mail with a link to a survey about your care and experience with [place] today! We would greatly appreciate your feedback!   Thanks for letting us be apart of your health journey!!  Primary Care and Sports Medicine   Dr. Ceasar Mons [place]   We encourage you to activate your patient portal called "MyChart".  Sign up information is provided on this After Visit Summary.  MyChart is used to connect with patients for Virtual Visits (Telemedicine).  Patients are able to view lab/test results, encounter notes, upcoming appointments, etc.  Non-urgent messages can be sent to your provider as well. To learn more about what you can do with MyChart, please visit --  [email or web address].

## 2023-09-10 NOTE — Telephone Encounter (Signed)
 [name] left message asking to make an appointment for flu vaccine another vaccine she may be due. Called back and left VM that we no longer have flu vaccine in stock. Check w/PCP or go to a local pharmacy.

## 2023-10-03 ENCOUNTER — Ambulatory Visit (HOSPITAL_BASED_OUTPATIENT_CLINIC_OR_DEPARTMENT_OTHER): Admitting: Family Medicine

## 2023-10-31 ENCOUNTER — Ambulatory Visit
Admission: EM | Admit: 2023-10-31 | Discharge: 2023-10-31 | Disposition: A | Discharge status: Home / Self Care | Attending: Family Medicine | Admitting: Family Medicine

## 2023-10-31 ENCOUNTER — Other Ambulatory Visit: Payer: Self-pay

## 2023-10-31 DIAGNOSIS — M545 Low back pain, unspecified: Secondary | ICD-10-CM

## 2023-10-31 MED ORDER — DICLOFENAC SODIUM 1 % EX GEL: 2.0000 g | Freq: Four times a day (QID) | CUTANEOUS | 0 refills | Status: DC | PRN
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
## 2023-10-31 NOTE — Discharge Instructions (Addendum)
 Start diclofenac topical gel up to 4 times a day as you need to for that low back pain.  Continue heat and rest.  Follow-up with your PCP if your symptoms do not improve.  Please go to the ER if you develop any worsening symptoms.  I hope you feel better soon!
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
## 2023-12-13 ENCOUNTER — Ambulatory Visit (HOSPITAL_BASED_OUTPATIENT_CLINIC_OR_DEPARTMENT_OTHER): Admitting: Family Medicine

## 2024-01-09 ENCOUNTER — Encounter (HOSPITAL_BASED_OUTPATIENT_CLINIC_OR_DEPARTMENT_OTHER): Payer: Self-pay | Admitting: Family Medicine

## 2024-01-09 ENCOUNTER — Ambulatory Visit (INDEPENDENT_AMBULATORY_CARE_PROVIDER_SITE_OTHER): Admitting: Family Medicine

## 2024-01-09 VITALS — BP 121/67 | HR 77 | Ht 65.0 in | Wt 133.5 lb

## 2024-01-09 DIAGNOSIS — R918 Other nonspecific abnormal finding of lung field: Secondary | ICD-10-CM | POA: Diagnosis not present

## 2024-01-09 DIAGNOSIS — I10 Essential (primary) hypertension: Secondary | ICD-10-CM

## 2024-01-09 NOTE — Progress Notes (Signed)
    Procedures performed today:    None.  Independent interpretation of notes and tests performed by another provider:   None.  Brief History, Exam, Impression, and Recommendations:    BP 121/67 (BP Location: Right Arm, Patient Position: Sitting, Cuff Size: Normal)   Pulse 77   Ht 5' 5 (1.651 m)   Wt 133 lb 8 oz (60.6 kg)   SpO2 98%   BMI 22.22 kg/m   Lung mass Assessment & Plan: Noted on recent CT scan in the emergency department with concern for metastatic disease given patient's history.  We previously did discuss recommendation for sooner follow-up with [name]. She indicated that she would stop by their office after alst appointment but never did. Again reviewed importance of having sooner follow-up with oncology.  Discussed concerns related to findings on imaging and potential for these to represent metastatic disease.  She voiced understanding and indicates that she will stop by oncology office today in order to schedule sooner follow-up visit.   Hypertension, unspecified type Assessment & Plan: Noted on history, blood pressure at goal in office today Can continue to monitor intermittently at home and we will follow-up on readings in the office   Return in about 3 months (around 04/10/2024) for hypertension, fasting labs 1 week prior.   ___________________________________________ Prentiss Hammett de [place], MD, ABFM, [facility] Primary Care and Sports Medicine [facility]

## 2024-01-09 NOTE — Assessment & Plan Note (Signed)
 Noted on history, blood pressure at goal in office today Can continue to monitor intermittently at home and we will follow-up on readings in the office

## 2024-01-09 NOTE — Patient Instructions (Signed)
  Medication Instructions:  Your physician recommends that you continue on your current medications as directed. Please refer to the Current Medication list given to you today. --If you need a refill on any your medications before your next appointment, please call your pharmacy first. If no refills are authorized on file call the office.-- Lab Work: Your physician has recommended that you have lab work today: 1 week before next visit  If you have labs (blood work) drawn today and your tests are completely normal, you will receive your results via MyChart message OR a phone call from our staff.  Please ensure you check your voicemail in the event that you authorized detailed messages to be left on a delegated number. If you have any lab test that is abnormal or we need to change your treatment, we will call you to review the results.   Follow-Up: Your next appointment:   Your physician recommends that you schedule a follow-up appointment in: 3 month follow up cancel August appt  with Dr. de Peru  You will receive a text message or e-mail with a link to a survey about your care and experience with us  today! We would greatly appreciate your feedback!   Thanks for letting us  be apart of your health journey!!  Primary Care and Sports Medicine   Dr. Quintin sheerer Peru   We encourage you to activate your patient portal called MyChart.  Sign up information is provided on this After Visit Summary.  MyChart is used to connect with patients for Virtual Visits (Telemedicine).  Patients are able to view lab/test results, encounter notes, upcoming appointments, etc.  Non-urgent messages can be sent to your provider as well. To learn more about what you can do with MyChart, please visit --  [email or web address].

## 2024-01-09 NOTE — Assessment & Plan Note (Signed)
 Noted on recent CT scan in the emergency department with concern for metastatic disease given patient's history.  We previously did discuss recommendation for sooner follow-up with [name]. She indicated that she would stop by their office after alst appointment but never did. Again reviewed importance of having sooner follow-up with oncology.  Discussed concerns related to findings on imaging and potential for these to represent metastatic disease.  She voiced understanding and indicates that she will stop by oncology office today in order to schedule sooner follow-up visit.

## 2024-01-29 ENCOUNTER — Ambulatory Visit (HOSPITAL_BASED_OUTPATIENT_CLINIC_OR_DEPARTMENT_OTHER): Payer: Medicare HMO | Admitting: Family Medicine

## 2024-02-28 ENCOUNTER — Inpatient Hospital Stay

## 2024-02-28 ENCOUNTER — Inpatient Hospital Stay: Payer: Medicare HMO | Attending: Oncology | Admitting: Oncology

## 2024-02-28 ENCOUNTER — Encounter: Payer: Self-pay | Admitting: Oncology

## 2024-02-28 VITALS — BP 135/79 | HR 67 | Temp 97.8°F | Resp 18 | Ht 65.0 in | Wt 129.9 lb

## 2024-02-28 DIAGNOSIS — R110 Nausea: Secondary | ICD-10-CM | POA: Diagnosis not present

## 2024-02-28 DIAGNOSIS — G620 Drug-induced polyneuropathy: Secondary | ICD-10-CM | POA: Insufficient documentation

## 2024-02-28 DIAGNOSIS — C184 Malignant neoplasm of transverse colon: Secondary | ICD-10-CM | POA: Diagnosis not present

## 2024-02-28 DIAGNOSIS — C189 Malignant neoplasm of colon, unspecified: Secondary | ICD-10-CM

## 2024-02-28 DIAGNOSIS — Z87891 Personal history of nicotine dependence: Secondary | ICD-10-CM | POA: Insufficient documentation

## 2024-02-28 DIAGNOSIS — T451X5A Adverse effect of antineoplastic and immunosuppressive drugs, initial encounter: Secondary | ICD-10-CM | POA: Diagnosis not present

## 2024-02-28 DIAGNOSIS — Z86718 Personal history of other venous thrombosis and embolism: Secondary | ICD-10-CM | POA: Insufficient documentation

## 2024-02-28 DIAGNOSIS — R918 Other nonspecific abnormal finding of lung field: Secondary | ICD-10-CM | POA: Insufficient documentation

## 2024-02-28 DIAGNOSIS — Z9221 Personal history of antineoplastic chemotherapy: Secondary | ICD-10-CM | POA: Insufficient documentation

## 2024-02-28 LAB — CBC WITH DIFFERENTIAL (CANCER CENTER ONLY)
Abs Immature Granulocytes: 0.01 K/uL (ref 0.00–0.07)
Basophils Absolute: 0 K/uL (ref 0.0–0.1)
Basophils Relative: 1 %
Eosinophils Absolute: 0.2 K/uL (ref 0.0–0.5)
Eosinophils Relative: 3 %
HCT: 35.8 % — ABNORMAL LOW (ref 36.0–46.0)
Hemoglobin: 12.2 g/dL (ref 12.0–15.0)
Immature Granulocytes: 0 %
Lymphocytes Relative: 46 %
Lymphs Abs: 2.4 K/uL (ref 0.7–4.0)
MCH: 30.5 pg (ref 26.0–34.0)
MCHC: 34.1 g/dL (ref 30.0–36.0)
MCV: 89.5 fL (ref 80.0–100.0)
Monocytes Absolute: 0.5 K/uL (ref 0.1–1.0)
Monocytes Relative: 9 %
Neutro Abs: 2.2 K/uL (ref 1.7–7.7)
Neutrophils Relative %: 41 %
Platelet Count: 234 K/uL (ref 150–400)
RBC: 4 MIL/uL (ref 3.87–5.11)
RDW: 12.6 % (ref 11.5–15.5)
WBC Count: 5.2 K/uL (ref 4.0–10.5)
nRBC: 0 % (ref 0.0–0.2)

## 2024-02-28 LAB — CMP (CANCER CENTER ONLY)
ALT: 9 U/L (ref 0–44)
AST: 23 U/L (ref 15–41)
Albumin: 4.3 g/dL (ref 3.5–5.0)
Alkaline Phosphatase: 107 U/L (ref 38–126)
Anion gap: 10 (ref 5–15)
BUN: 12 mg/dL (ref 8–23)
CO2: 29 mmol/L (ref 22–32)
Calcium: 10.4 mg/dL — ABNORMAL HIGH (ref 8.9–10.3)
Chloride: 103 mmol/L (ref 98–111)
Creatinine: 0.73 mg/dL (ref 0.44–1.00)
GFR, Estimated: >60 mL/min (ref >60)
Glucose, Bld: 89 mg/dL (ref 70–99)
Potassium: 3.6 mmol/L (ref 3.5–5.1)
Sodium: 142 mmol/L (ref 135–145)
Total Bilirubin: 0.7 mg/dL (ref 0.0–1.2)
Total Protein: 7.4 g/dL (ref 6.5–8.1)

## 2024-02-28 LAB — CEA (ACCESS): CEA (CHCC): 1.82 ng/mL (ref 0.00–5.00)
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
## 2024-03-02 ENCOUNTER — Encounter: Payer: Self-pay | Admitting: *Deleted

## 2024-03-07 ENCOUNTER — Ambulatory Visit (HOSPITAL_BASED_OUTPATIENT_CLINIC_OR_DEPARTMENT_OTHER)
Admission: RE | Admit: 2024-03-07 | Discharge: 2024-03-07 | Disposition: A | Discharge status: Home / Self Care | Source: Ambulatory Visit | Attending: Oncology | Admitting: Oncology

## 2024-03-07 DIAGNOSIS — C189 Malignant neoplasm of colon, unspecified: Secondary | ICD-10-CM | POA: Diagnosis not present

## 2024-03-07 DIAGNOSIS — I700 Atherosclerosis of aorta: Secondary | ICD-10-CM | POA: Diagnosis not present

## 2024-03-07 DIAGNOSIS — K5750 Diverticulosis of both small and large intestine without perforation or abscess without bleeding: Secondary | ICD-10-CM | POA: Diagnosis not present

## 2024-03-07 MED ORDER — IOHEXOL 300 MG/ML  SOLN
100.0000 mL | Freq: Once | INTRAMUSCULAR | Status: AC | PRN
  Administered 2024-03-07: 100 mL via INTRAVENOUS

## 2024-03-13 ENCOUNTER — Encounter: Payer: Self-pay | Admitting: Nurse Practitioner

## 2024-03-13 ENCOUNTER — Inpatient Hospital Stay: Admitting: Nurse Practitioner

## 2024-03-13 VITALS — BP 152/81 | HR 72 | Temp 97.8°F | Resp 18 | Ht 65.0 in | Wt 130.9 lb

## 2024-03-13 DIAGNOSIS — Z9221 Personal history of antineoplastic chemotherapy: Secondary | ICD-10-CM | POA: Diagnosis not present

## 2024-03-13 DIAGNOSIS — C189 Malignant neoplasm of colon, unspecified: Secondary | ICD-10-CM

## 2024-03-13 DIAGNOSIS — C184 Malignant neoplasm of transverse colon: Secondary | ICD-10-CM | POA: Diagnosis not present

## 2024-03-13 DIAGNOSIS — Z87891 Personal history of nicotine dependence: Secondary | ICD-10-CM | POA: Diagnosis not present

## 2024-03-13 DIAGNOSIS — R918 Other nonspecific abnormal finding of lung field: Secondary | ICD-10-CM | POA: Diagnosis not present

## 2024-03-13 DIAGNOSIS — T451X5A Adverse effect of antineoplastic and immunosuppressive drugs, initial encounter: Secondary | ICD-10-CM | POA: Diagnosis not present

## 2024-03-13 DIAGNOSIS — G620 Drug-induced polyneuropathy: Secondary | ICD-10-CM | POA: Diagnosis not present

## 2024-03-13 DIAGNOSIS — R110 Nausea: Secondary | ICD-10-CM | POA: Diagnosis not present

## 2024-03-13 DIAGNOSIS — Z86718 Personal history of other venous thrombosis and embolism: Secondary | ICD-10-CM | POA: Diagnosis not present

## 2024-03-13 NOTE — Progress Notes (Unsigned)
 Luverne Aran, MD  [facility]win Rosella L PROCEDURE / BIOPSY REVIEW Date: 03/13/24  Requested Biopsy site: RUL lung mass Reason for request: Enlarging metastatic lesion from colon CA Imaging review: Best seen on CT chest  Decision: Approved Imaging modality to perform: Ultrasound and CT Schedule with: Moderate Sedation Schedule for: Any VIR  Additional comments:   Please contact me with questions, concerns, or if issue pertaining to this request arise.  Aran ONEIDA Luverne, MD [facility]       Previous Messages    ----- Message ----- From: Baldwin Rosella CROME Sent: 03/13/2024  11:01 AM EDT To: Rosella CROME Baldwin; Taryn F Rigney, RT; Ir Proc* Subject: CT LUNG MASS BIOPSY                            Procedure :CT LUNG MASS BIOPSY  Reason :h/o colon cancer, consider biopsy of subpleural RUL mass Dx: Malignant neoplasm of colon, unspecified part of colon (HCC) [C18.9 (ICD-10-CM)]; Mass of upper lobe of right lung [R91.8 (ICD-10-CM)]    History:CT CHEST ABDOMEN PELVIS W CONTRAST,MR BRAIN W WO CONTRAST ,CT Chest W Contrast ,CT ANGIO HEAD NECK W WO CM

## 2024-03-13 NOTE — Progress Notes (Signed)
 [facility] OFFICE PROGRESS NOTE   Diagnosis: Colon cancer, right lung mass/right hilar adenopathy  INTERVAL HISTORY:   [name] returns as scheduled.  She feels well.  She has a good appetite.  She is concerned about weight loss.  No fever, cough, shortness of breath.  Regular bowel movements.  No bleeding.  She reports she has been smoking cigarettes intermittently since age 78.  Objective:  Vital signs in last 24 hours:  Blood pressure (!) 152/81, pulse 72, temperature 97.8 F (36.6 C), temperature source Temporal, resp. rate 18, height 5' 5 (1.651 m), weight 130 lb 14.4 oz (59.4 kg), SpO2 100%.    HEENT: No thrush or ulcers. Lymphatics: No palpable cervical, supraclavicular or axillary lymph nodes. Resp: Lungs clear bilaterally. Cardio: Regular rate and rhythm. GI: No hepatosplenomegaly. Vascular: No leg edema.    Lab Results:  Lab Results  Component Value Date   WBC 5.2 02/28/2024   HGB 12.2 02/28/2024   HCT 35.8 (L) 02/28/2024   MCV 89.5 02/28/2024   PLT 234 02/28/2024   NEUTROABS 2.2 02/28/2024    Imaging:  No results found.  Medications: I have reviewed the patient's current medications.  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 Adjuvant FOLFOX chemotherapy was initiated on 01/21/2012. The last cycle was completed on 06/30/2012. Restaging CT scans 09/30/2012 showed no evidence of recurrent/metastatic disease. -Surveillance colonoscopy 02/24/2013 with a single hyperplastic polyp. Next colonoscopy recommended at a 3 year interval.  -Surveillance CT scans 10/12/2013 with no evidence of metastatic disease   -Surveillance CT scans 10/14/2014 with no evidence of metastatic disease -Negative surveillance colonoscopy 03/30/2016  -Negative surveillance colonoscopy 02/28/2021 -Negative colonoscopy 08/15/2021 -CT chest 08/17/2023: 3.7 cm right upper lobe  mass, 10 mm left upper lobe nodule -CTs 03/07/2024-enlargement of spiculated subpleural mass peripheral right upper lobe measuring 4.7 x 2.9 cm previously 3.7 x 2.5 cm.  Newly enlarged right hilar lymph node.  Unchanged left upper lobe nodule.  Multiple additional tiny nodules scattered in the apices unchanged.  No evidence of lymphadenopathy or metastatic disease in the abdomen or pelvis. Delayed nausea following cycle 2 FOLFOX. Aloxi  was added with cycle 3. Persistent delayed nausea. Emend was added with cycle 4. She noted improvement in the nausea following cycle 4. She declined steroid prophylaxis.   Tiny lung nodules seen on a CT of the chest 10/19/2011. Stable on CT of the chest 10/12/2013 and 10/14/2014 History of tobacco use Endometrial thickening on CT of the abdomen 10/02/2011. Progressive endometrial thickening on CT 09/30/2012. Status post removal of an endometrial polyp 02/03/2013 with benign pathology. Port-A-Cath placement 01/09/2012 in interventional radiology. Removed 12/30/2012. 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 The finger numbness has improved.   Mild thrombocytopenia secondary to chemotherapy-resolved. History of neutropenia secondary to chemotherapy. 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 08/17/2023 CT chest-3.7 cm right upper lobe mass and 10 mm left upper lobe nodule CTs 03/07/2024-interval enlargement of a spiculated subpleural mass peripheral right upper lobe measuring 4.7 x 2.9 cm previously 3.7 x 2.5 cm.  Newly enlarged right hilar node consistent with nodal metastatic disease.  Unchanged rounded left upper lobe nodule measuring 1.0 x 0.8 cm.  Multiple additional tiny nodules scattered in the apices unchanged.  No evidence of lymphadenopathy or metastatic disease in the abdomen or pelvis. Patient report of weight loss 03/13/2024    Disposition: [name] appears stable.  We reviewed the recent CT  report/images with her at  today's visit.  She understands the right upper lobe lung mass is larger and there is a newly enlarged right hilar lymph node; a left upper lobe nodule is stable.  We discussed the possibility of a lung primary, metastasis from colon cancer, metastatic disease from another site.  She agrees with the recommendation for a biopsy and PET scan.  Referral placed to consider biopsy of the right upper lobe mass.  Staging PET scan ordered.  She will return for follow-up in 2 to 3 weeks to review results.  She will contact the office in the interim with any problems.  Patient seen with [name].  Olam Ned ANP/GNP-BC   03/13/2024  9:42 AM This was a shared visit with Olam Ned.  We reviewed the CT findings and images with Ms. Mosely.  The CT is consistent with a primary lung cancer or less likely metastatic disease.  She will be referred for a biopsy of the pleural-based right upper lung mass and staging PET scan.  I was present for greater than 50% of today's visit.  I performed medical decision making.  Arvella Cloretta, MD

## 2024-03-20 ENCOUNTER — Encounter (HOSPITAL_COMMUNITY)
Admission: RE | Admit: 2024-03-20 | Discharge: 2024-03-20 | Disposition: A | Discharge status: Home / Self Care | Source: Ambulatory Visit | Attending: Nurse Practitioner | Admitting: Nurse Practitioner

## 2024-03-20 DIAGNOSIS — C189 Malignant neoplasm of colon, unspecified: Secondary | ICD-10-CM | POA: Insufficient documentation

## 2024-03-20 DIAGNOSIS — R918 Other nonspecific abnormal finding of lung field: Secondary | ICD-10-CM | POA: Diagnosis not present

## 2024-03-20 LAB — GLUCOSE, CAPILLARY: Glucose-Capillary: 89 mg/dL (ref 70–99)

## 2024-03-20 MED ORDER — FLUDEOXYGLUCOSE F - 18 (FDG) INJECTION
6.5000 | Freq: Once | INTRAVENOUS | Status: AC
  Administered 2024-03-20: 6.47 via INTRAVENOUS

## 2024-03-31 ENCOUNTER — Other Ambulatory Visit: Payer: Self-pay | Admitting: Diagnostic Radiology

## 2024-03-31 DIAGNOSIS — Z01818 Encounter for other preprocedural examination: Secondary | ICD-10-CM

## 2024-04-01 ENCOUNTER — Ambulatory Visit (HOSPITAL_COMMUNITY)
Admission: RE | Admit: 2024-04-01 | Discharge: 2024-04-01 | Disposition: A | Discharge status: Home / Self Care | Source: Ambulatory Visit | Attending: Nurse Practitioner | Admitting: Nurse Practitioner

## 2024-04-01 ENCOUNTER — Other Ambulatory Visit: Payer: Self-pay

## 2024-04-01 ENCOUNTER — Ambulatory Visit (HOSPITAL_COMMUNITY)
Admission: RE | Admit: 2024-04-01 | Discharge: 2024-04-01 | Disposition: A | Source: Ambulatory Visit | Attending: Interventional Radiology | Admitting: Interventional Radiology

## 2024-04-01 DIAGNOSIS — Z01818 Encounter for other preprocedural examination: Secondary | ICD-10-CM | POA: Diagnosis not present

## 2024-04-01 DIAGNOSIS — Z85038 Personal history of other malignant neoplasm of large intestine: Secondary | ICD-10-CM | POA: Insufficient documentation

## 2024-04-01 DIAGNOSIS — C7801 Secondary malignant neoplasm of right lung: Secondary | ICD-10-CM | POA: Insufficient documentation

## 2024-04-01 DIAGNOSIS — I700 Atherosclerosis of aorta: Secondary | ICD-10-CM | POA: Diagnosis not present

## 2024-04-01 DIAGNOSIS — F17210 Nicotine dependence, cigarettes, uncomplicated: Secondary | ICD-10-CM | POA: Diagnosis not present

## 2024-04-01 DIAGNOSIS — R918 Other nonspecific abnormal finding of lung field: Secondary | ICD-10-CM | POA: Diagnosis not present

## 2024-04-01 DIAGNOSIS — C3491 Malignant neoplasm of unspecified part of right bronchus or lung: Secondary | ICD-10-CM | POA: Diagnosis not present

## 2024-04-01 DIAGNOSIS — C189 Malignant neoplasm of colon, unspecified: Secondary | ICD-10-CM

## 2024-04-01 LAB — CBC
HCT: 37.1 % (ref 36.0–46.0)
Hemoglobin: 12 g/dL (ref 12.0–15.0)
MCH: 29.8 pg (ref 26.0–34.0)
MCHC: 32.3 g/dL (ref 30.0–36.0)
MCV: 92.1 fL (ref 80.0–100.0)
Platelets: 254 K/uL (ref 150–400)
RBC: 4.03 MIL/uL (ref 3.87–5.11)
RDW: 12.5 % (ref 11.5–15.5)
WBC: 5.2 K/uL (ref 4.0–10.5)
nRBC: 0 % (ref 0.0–0.2)

## 2024-04-01 LAB — PROTIME-INR
INR: 0.9 (ref 0.8–1.2)
Prothrombin Time: 12.7 s (ref 11.4–15.2)

## 2024-04-01 MED ORDER — FENTANYL CITRATE (PF) 100 MCG/2ML IJ SOLN
INTRAMUSCULAR | Status: AC | PRN
  Administered 2024-04-01 (×2): 50 ug via INTRAVENOUS

## 2024-04-01 MED ORDER — FENTANYL CITRATE (PF) 100 MCG/2ML IJ SOLN
INTRAMUSCULAR | Status: AC
  Filled 2024-04-01: qty 4

## 2024-04-01 MED ORDER — SODIUM CHLORIDE 0.9 % IV SOLN: INTRAVENOUS | Status: DC

## 2024-04-01 MED ORDER — MIDAZOLAM HCL 2 MG/2ML IJ SOLN
INTRAMUSCULAR | Status: AC | PRN
  Administered 2024-04-01 (×2): 1 mg via INTRAVENOUS

## 2024-04-01 MED ORDER — MIDAZOLAM HCL 2 MG/2ML IJ SOLN
INTRAMUSCULAR | Status: AC
  Filled 2024-04-01: qty 4

## 2024-04-01 MED ORDER — LIDOCAINE 1 % OPTIME INJ - NO CHARGE
10.0000 mL | Freq: Once | INTRAMUSCULAR | Status: AC
  Administered 2024-04-01: 10 mL
  Filled 2024-04-01: qty 10
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
## 2024-04-01 NOTE — Progress Notes (Signed)
 Discharge instructions reviewed with patient and Alyse Stanford at bedside denies questions or concerns. PT ambulated in the hallway to the bathroom was able to void without difficulty. Incision site remained C/D/I. PT escorted from the unit via wheel chair to personal vehicle.

## 2024-04-01 NOTE — Procedures (Signed)
 Interventional Radiology Procedure Note  Procedure: CT GUIDED CORE BX RT LUNG MASS    Complications: None  Estimated Blood Loss:  MIN  Findings: 68 G CORE X 2    M. FREDERIC SPECKING, MD

## 2024-04-03 ENCOUNTER — Encounter: Payer: Self-pay | Admitting: *Deleted

## 2024-04-03 ENCOUNTER — Encounter: Payer: Self-pay | Admitting: Nurse Practitioner

## 2024-04-03 ENCOUNTER — Inpatient Hospital Stay: Attending: Oncology | Admitting: Nurse Practitioner

## 2024-04-03 VITALS — BP 142/80 | HR 67 | Temp 97.8°F | Resp 18 | Ht 65.0 in | Wt 131.5 lb

## 2024-04-03 DIAGNOSIS — Z86718 Personal history of other venous thrombosis and embolism: Secondary | ICD-10-CM | POA: Insufficient documentation

## 2024-04-03 DIAGNOSIS — C189 Malignant neoplasm of colon, unspecified: Secondary | ICD-10-CM

## 2024-04-03 DIAGNOSIS — C7801 Secondary malignant neoplasm of right lung: Secondary | ICD-10-CM | POA: Diagnosis not present

## 2024-04-03 DIAGNOSIS — C184 Malignant neoplasm of transverse colon: Secondary | ICD-10-CM | POA: Diagnosis not present

## 2024-04-03 DIAGNOSIS — Z9221 Personal history of antineoplastic chemotherapy: Secondary | ICD-10-CM | POA: Diagnosis not present

## 2024-04-03 DIAGNOSIS — Z87891 Personal history of nicotine dependence: Secondary | ICD-10-CM | POA: Diagnosis not present

## 2024-04-03 NOTE — Progress Notes (Signed)
 MMR and Foundation one testing requested on accession number [id number]

## 2024-04-03 NOTE — Progress Notes (Signed)
 [facility] OFFICE PROGRESS NOTE   Diagnosis: Colon cancer, right lung mass/right hilar adenopathy  INTERVAL HISTORY:   [name] returns as scheduled.  She feels well.  She has a good appetite.  No shortness of breath.  Bowels moving regularly.  Objective:  Vital signs in last 24 hours:  Blood pressure (!) 142/80, pulse 67, temperature 97.8 F (36.6 C), temperature source Temporal, resp. rate 18, height 5' 5 (1.651 m), weight 131 lb 8 oz (59.6 kg), SpO2 100%.    Resp: Lungs clear bilaterally. Cardio: Regular rate and rhythm. GI: No hepatosplenomegaly. Vascular: No leg edema.  Lab Results:  Lab Results  Component Value Date   WBC 5.2 04/01/2024   HGB 12.0 04/01/2024   HCT 37.1 04/01/2024   MCV 92.1 04/01/2024   PLT 254 04/01/2024   NEUTROABS 2.2 02/28/2024    Imaging:  No results found.  Medications: I have reviewed the patient's current medications.  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 Adjuvant FOLFOX chemotherapy was initiated on 01/21/2012. The last cycle was completed on 06/30/2012. Restaging CT scans 09/30/2012 showed no evidence of recurrent/metastatic disease. -Surveillance colonoscopy 02/24/2013 with a single hyperplastic polyp. Next colonoscopy recommended at a 3 year interval.  -Surveillance CT scans 10/12/2013 with no evidence of metastatic disease   -Surveillance CT scans 10/14/2014 with no evidence of metastatic disease -Negative surveillance colonoscopy 03/30/2016  -Negative surveillance colonoscopy 02/28/2021 -Negative colonoscopy 08/15/2021 -CT chest 08/17/2023: 3.7 cm right upper lobe mass, 10 mm left upper lobe nodule -CTs 03/07/2024-enlargement of spiculated subpleural mass peripheral right upper lobe measuring 4.7 x 2.9 cm previously 3.7 x 2.5 cm.  Newly enlarged right hilar lymph node.  Unchanged left upper lobe nodule.  Multiple  additional tiny nodules scattered in the apices unchanged.  No evidence of lymphadenopathy or metastatic disease in the abdomen or pelvis. Delayed nausea following cycle 2 FOLFOX. Aloxi  was added with cycle 3. Persistent delayed nausea. Emend was added with cycle 4. She noted improvement in the nausea following cycle 4. She declined steroid prophylaxis.   Tiny lung nodules seen on a CT of the chest 10/19/2011. Stable on CT of the chest 10/12/2013 and 10/14/2014 History of tobacco use Endometrial thickening on CT of the abdomen 10/02/2011. Progressive endometrial thickening on CT 09/30/2012. Status post removal of an endometrial polyp 02/03/2013 with benign pathology. Port-A-Cath placement 01/09/2012 in interventional radiology. Removed 12/30/2012. 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 The finger numbness has improved.   Mild thrombocytopenia secondary to chemotherapy-resolved. History of neutropenia secondary to chemotherapy. 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 08/17/2023 CT chest-3.7 cm right upper lobe mass and 10 mm left upper lobe nodule CTs 03/07/2024-interval enlargement of a spiculated subpleural mass peripheral right upper lobe measuring 4.7 x 2.9 cm previously 3.7 x 2.5 cm.  Newly enlarged right hilar node consistent with nodal metastatic disease.  Unchanged rounded left upper lobe nodule measuring 1.0 x 0.8 cm.  Multiple additional tiny nodules scattered in the apices unchanged.  No evidence of lymphadenopathy or metastatic disease in the abdomen or pelvis. PET scan 03/20/2024-enlarging right upper lobe pulmonary mass intensely hypermetabolic.  No other evidence of metastatic disease.  Specifically no hypermetabolic right hilar adenopathy.  Other scattered small pulmonary nodules are stable and without hypermetabolic activity. 04/01/2024 biopsy right lung mass-adenocarcinoma with morphologic features typical of metastatic colonic adenocarcinoma;  immunostains pending. Patient report of weight loss 03/13/2024  Disposition: Ms.  Luna appears stable.  We reviewed the recent PET scan report/images with her at today's visit.  She understands the right upper lobe mass is significantly hypermetabolic.  The left upper lobe nodule is stable in size compared to recent CTs but larger compared to CT imaging from 2015.  She understands the left upper lobe nodule is concerning for cancer as well.  We reviewed the pathology report from the right lung mass biopsy showing adenocarcinoma morphologically consistent with metastatic colon cancer, immunostains are pending.  Her case will be reviewed at the upcoming GI tumor conference.  We will contact her with consensus recommendations.  Return visit in 3 to 4 weeks, sooner pending recommendations from the GI tumor conference.  Patient seen with [name].  [name] ANP/GNP-BC   04/03/2024  11:01 AM This was a shared visit with [name].  [name] has been diagnosed with adenocarcinoma involving biopsy of the right upper lobe mass.  The initial pathology review is consistent with metastatic colon cancer.  The dominant right upper lobe mass was not present on remote chest CT imaging, but the smaller left upper lobe nodule was present in the remote past, and has enlarged.  We reviewed multiple chest CT images with [name].  It is possible the right upper lobe mass is related to metastatic colon cancer.  The dominant left lung nodule could be related to a slow-growing metastasis or an indolent lung cancer.  We will wait on results from immunohistochemistry stains on the lung biopsy tissue.  We will submit the tissue for NGS and MSI testing.  I will present her case at the GI tumor conference next week.  We discussed treatment options including referral to resect the dominant lung mass and stereotactic radiosurgery to 1 or both lung lesions.  Arvella Cloretta, MD

## 2024-04-08 ENCOUNTER — Telehealth: Payer: Self-pay

## 2024-04-08 ENCOUNTER — Other Ambulatory Visit: Payer: Self-pay | Admitting: *Deleted

## 2024-04-08 NOTE — Progress Notes (Signed)
 The proposed treatment discussed in conference is for discussion purpose only and is not a binding recommendation.  The patients have not been physically examined, or presented with their treatment options.  Therefore, final treatment plans cannot be decided.

## 2024-04-08 NOTE — Telephone Encounter (Signed)
 Thoracic Tumor Board Review  [name] was reviewed before presentation on the on the Thoracic Tumor Board on 04/09/2024. Based on she is a candidate for genetic testing.  [name] meets [employer] (NCCN) criteria for genetic testing for Lynch Syndrome based on her  history of colon cancer diagnosed at >50 without MMR deficiency tested in tumor.   Robin Fryer, MS, CGC  Certified Genetic Counselor  Email: [name]  Phone: [phone]

## 2024-04-09 ENCOUNTER — Other Ambulatory Visit: Payer: Self-pay | Admitting: Nurse Practitioner

## 2024-04-09 ENCOUNTER — Other Ambulatory Visit: Payer: Self-pay | Admitting: *Deleted

## 2024-04-09 DIAGNOSIS — R918 Other nonspecific abnormal finding of lung field: Secondary | ICD-10-CM

## 2024-04-09 DIAGNOSIS — C189 Malignant neoplasm of colon, unspecified: Secondary | ICD-10-CM

## 2024-04-09 LAB — SURGICAL PATHOLOGY

## 2024-04-13 ENCOUNTER — Ambulatory Visit (HOSPITAL_BASED_OUTPATIENT_CLINIC_OR_DEPARTMENT_OTHER): Admitting: Family Medicine

## 2024-04-16 DIAGNOSIS — C189 Malignant neoplasm of colon, unspecified: Secondary | ICD-10-CM | POA: Diagnosis not present

## 2024-04-28 ENCOUNTER — Encounter: Payer: Self-pay | Admitting: Nurse Practitioner

## 2024-04-28 ENCOUNTER — Inpatient Hospital Stay: Attending: Oncology | Admitting: Nurse Practitioner

## 2024-04-28 VITALS — BP 153/92 | HR 77 | Temp 97.8°F | Resp 18 | Ht 65.0 in | Wt 130.5 lb

## 2024-04-28 DIAGNOSIS — Z87891 Personal history of nicotine dependence: Secondary | ICD-10-CM

## 2024-04-28 DIAGNOSIS — C7800 Secondary malignant neoplasm of unspecified lung: Secondary | ICD-10-CM

## 2024-04-28 DIAGNOSIS — C189 Malignant neoplasm of colon, unspecified: Secondary | ICD-10-CM

## 2024-04-28 DIAGNOSIS — G620 Drug-induced polyneuropathy: Secondary | ICD-10-CM | POA: Diagnosis not present

## 2024-04-28 DIAGNOSIS — T451X5A Adverse effect of antineoplastic and immunosuppressive drugs, initial encounter: Secondary | ICD-10-CM | POA: Diagnosis not present

## 2024-04-28 DIAGNOSIS — R918 Other nonspecific abnormal finding of lung field: Secondary | ICD-10-CM | POA: Diagnosis not present

## 2024-04-28 DIAGNOSIS — R110 Nausea: Secondary | ICD-10-CM | POA: Diagnosis not present

## 2024-04-28 DIAGNOSIS — C7801 Secondary malignant neoplasm of right lung: Secondary | ICD-10-CM | POA: Diagnosis not present

## 2024-04-28 DIAGNOSIS — C184 Malignant neoplasm of transverse colon: Secondary | ICD-10-CM | POA: Diagnosis not present

## 2024-04-28 NOTE — Progress Notes (Unsigned)
 [facility] OFFICE PROGRESS NOTE   Diagnosis: Colon cancer  INTERVAL HISTORY:   [name] returns as scheduled.  She is accompanied by 2 sons.  She feels well.  She has a good appetite.  She is not sure why she continues to lose weight.  She denies cough and shortness of breath.  No abdominal pain.  Bowels moving regularly.  Objective:  Vital signs in last 24 hours:  Blood pressure (!) 153/92, pulse 77, temperature 97.8 F (36.6 C), temperature source Temporal, resp. rate 18, height 5' 5 (1.651 m), weight 130 lb 8 oz (59.2 kg), SpO2 99%.    Resp: Lungs clear bilaterally. Cardio: Regular rate and rhythm. GI: No hepatosplenomegaly. Vascular: No leg edema.   Lab Results:  Lab Results  Component Value Date   WBC 5.2 04/01/2024   HGB 12.0 04/01/2024   HCT 37.1 04/01/2024   MCV 92.1 04/01/2024   PLT 254 04/01/2024   NEUTROABS 2.2 02/28/2024    Imaging:  No results found.  Medications: I have reviewed the patient's current medications.  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 Adjuvant FOLFOX chemotherapy was initiated on 01/21/2012. The last cycle was completed on 06/30/2012. Restaging CT scans 09/30/2012 showed no evidence of recurrent/metastatic disease. -Surveillance colonoscopy 02/24/2013 with a single hyperplastic polyp. Next colonoscopy recommended at a 3 year interval.  -Surveillance CT scans 10/12/2013 with no evidence of metastatic disease   -Surveillance CT scans 10/14/2014 with no evidence of metastatic disease -Negative surveillance colonoscopy 03/30/2016  -Negative surveillance colonoscopy 02/28/2021 -Negative colonoscopy 08/15/2021 -CT chest 08/17/2023: 3.7 cm right upper lobe mass, 10 mm left upper lobe nodule -CTs 03/07/2024-enlargement of spiculated subpleural mass peripheral right upper lobe measuring 4.7 x 2.9 cm previously 3.7 x 2.5 cm.   Newly enlarged right hilar lymph node.  Unchanged left upper lobe nodule.  Multiple additional tiny nodules scattered in the apices unchanged.  No evidence of lymphadenopathy or metastatic disease in the abdomen or pelvis. -PET scan 03/20/2024-right upper lobe pulmonary mass is intensely hypermetabolic.  No other evidence of metastatic disease.  Specifically no hypermetabolic right hilar adenopathy.  Other scattered small pulmonary nodules are stable and without hypermetabolic activity although are suboptimally evaluated based on size. -04/01/2024 biopsy right lung mass-adenocarcinoma with morphologic features typical of metastatic colonic adenocarcinoma; IHC stains show cytokeratin 20 positive, CDX2/TTF-1/Napsin A negative.  MMR with preserved expression; PD-L1 TPS 0%; foundation 1-microsatellite stable, tumor mutation burden 16, KRAS/NRAS wild-type, FGFR1 amplification, RB1 loss -04/08/2024 tumor board discussion-surgical consult for resection of right lung mass, radiation not recommended due to size; continue to monitor left lung lesion -Appointment with Dr. Kerrin 05/05/2024 Delayed nausea following cycle 2 FOLFOX. Aloxi  was added with cycle 3. Persistent delayed nausea. Emend was added with cycle 4. She noted improvement in the nausea following cycle 4. She declined steroid prophylaxis.   Tiny lung nodules seen on a CT of the chest 10/19/2011. Stable on CT of the chest 10/12/2013 and 10/14/2014 History of tobacco use Endometrial thickening on CT of the abdomen 10/02/2011. Progressive endometrial thickening on CT 09/30/2012. Status post removal of an endometrial polyp 02/03/2013 with benign pathology. Port-A-Cath placement 01/09/2012 in interventional radiology. Removed 12/30/2012. 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 The finger numbness has improved.   Mild thrombocytopenia secondary to chemotherapy-resolved. History of neutropenia secondary to chemotherapy. Acute DVT  right internal jugular and subclavian veins 06/16/2012. 08/17/2023 CT chest-3.7 cm right upper lobe mass  and 10 mm left upper lobe nodule CTs 03/07/2024-interval enlargement of a spiculated subpleural mass peripheral right upper lobe measuring 4.7 x 2.9 cm previously 3.7 x 2.5 cm.  Newly enlarged right hilar node consistent with nodal metastatic disease.  Unchanged rounded left upper lobe nodule measuring 1.0 x 0.8 cm.  Multiple additional tiny nodules scattered in the apices unchanged.  No evidence of lymphadenopathy or metastatic disease in the abdomen or pelvis. PET scan 03/20/2024-enlarging right upper lobe pulmonary mass intensely hypermetabolic.  No other evidence of metastatic disease.  Specifically no hypermetabolic right hilar adenopathy.  Other scattered small pulmonary nodules are stable and without hypermetabolic activity. 04/01/2024 biopsy right lung mass-adenocarcinoma with morphologic features typical of metastatic colonic adenocarcinoma; IHC stains show cytokeratin 20 positive, CDX2/TTF-1/Napsin A negative. MMR with preserved expression. Referred to Dr. Kerrin See #1 for further information Patient report of weight loss 03/13/2024    Disposition: [name] appears stable.  We reviewed CT/PET images, right lung mass biopsy results and tumor board recommendations with her and her sons at today's visit.  They understand the right lung mass is consistent with metastatic colon cancer.  They understand that the left lung mass could be a benign process or an additional area of cancer.  She is scheduled to see Dr. Kerrin next week to discuss resection of the right lung mass.  We have referred her for PFTs.  The left lung mass will be monitored.  They are in agreement with this plan.  She will return for follow-up in approximately 6 weeks.  We will adjust to a sooner appointment pending Dr. Chrystal evaluation.  Patient seen with [name].    Olam Ned ANP/GNP-BC    04/28/2024  11:23 AM  This was a shared visit with Olam Ned.  We reviewed the pathology and imaging findings with [name] and her sons.  We reviewed the CT and Pet images.  Her case was presented at the GI tumor conference.  The left lung nodule is felt to most likely be benign, but this could represent a slow-growing metastasis.  I recommend proceeding with resection of the right lung mass.  We will request NGS testing on the resection specimen.  We will consider systemic treatment options if she is not a surgical candidate.  Arvella Cloretta, MD

## 2024-04-30 ENCOUNTER — Ambulatory Visit (HOSPITAL_COMMUNITY)
Admission: RE | Admit: 2024-04-30 | Discharge: 2024-04-30 | Disposition: A | Discharge status: Home / Self Care | Source: Ambulatory Visit | Attending: Nurse Practitioner | Admitting: Nurse Practitioner

## 2024-04-30 DIAGNOSIS — Z87891 Personal history of nicotine dependence: Secondary | ICD-10-CM | POA: Insufficient documentation

## 2024-04-30 DIAGNOSIS — R918 Other nonspecific abnormal finding of lung field: Secondary | ICD-10-CM | POA: Diagnosis not present

## 2024-04-30 DIAGNOSIS — C7800 Secondary malignant neoplasm of unspecified lung: Secondary | ICD-10-CM | POA: Insufficient documentation

## 2024-04-30 DIAGNOSIS — C189 Malignant neoplasm of colon, unspecified: Secondary | ICD-10-CM | POA: Insufficient documentation

## 2024-04-30 LAB — PULMONARY FUNCTION TEST
DL/VA % pred: 114 %
DL/VA: 4.65 ml/min/mmHg/L
DLCO unc % pred: 76 %
DLCO unc: 15.14 ml/min/mmHg
FEF 25-75 Post: 1.33 L/s
FEF 25-75 Pre: 1.67 L/s
FEF2575-%Change-Post: -20 %
FEF2575-%Pred-Post: 79 %
FEF2575-%Pred-Pre: 100 %
FEV1-%Change-Post: -1 %
FEV1-%Pred-Post: 74 %
FEV1-%Pred-Pre: 75 %
FEV1-Post: 1.62 L
FEV1-Pre: 1.64 L
FEV1FVC-%Change-Post: 0 %
FEV1FVC-%Pred-Pre: 105 %
FEV6-%Change-Post: 1 %
FEV6-%Pred-Post: 75 %
FEV6-%Pred-Pre: 74 %
FEV6-Post: 2.07 L
FEV6-Pre: 2.04 L
FEV6FVC-%Pred-Post: 105 %
FEV6FVC-%Pred-Pre: 105 %
FVC-%Change-Post: 0 %
FVC-%Pred-Post: 71 %
FVC-%Pred-Pre: 71 %
FVC-Post: 2.07 L
FVC-Pre: 2.08 L
Post FEV1/FVC ratio: 79 %
Post FEV6/FVC ratio: 100 %
Pre FEV1/FVC ratio: 79 %
Pre FEV6/FVC Ratio: 100 %
RV % pred: 89 %
RV: 2.1 L
TLC % pred: 76 %
TLC: 3.97 L

## 2024-04-30 MED ORDER — ALBUTEROL SULFATE (2.5 MG/3ML) 0.083% IN NEBU
2.5000 mg | INHALATION_SOLUTION | Freq: Once | RESPIRATORY_TRACT | Status: AC
  Administered 2024-04-30: 2.5 mg via RESPIRATORY_TRACT

## 2024-05-05 ENCOUNTER — Ambulatory Visit
Attending: Thoracic Surgery (Cardiothoracic Vascular Surgery) | Admitting: Thoracic Surgery (Cardiothoracic Vascular Surgery)

## 2024-05-05 ENCOUNTER — Other Ambulatory Visit: Payer: Self-pay | Admitting: *Deleted

## 2024-05-05 ENCOUNTER — Encounter: Payer: Self-pay | Admitting: Thoracic Surgery (Cardiothoracic Vascular Surgery)

## 2024-05-05 ENCOUNTER — Encounter: Payer: Self-pay | Admitting: *Deleted

## 2024-05-05 VITALS — BP 187/83 | HR 62 | Resp 20 | Ht 65.0 in | Wt 133.9 lb

## 2024-05-05 DIAGNOSIS — R918 Other nonspecific abnormal finding of lung field: Secondary | ICD-10-CM | POA: Diagnosis not present
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
## 2024-05-11 NOTE — Progress Notes (Signed)
 Surgical Instructions   Your procedure is scheduled on Thursday, November 20th. Report to [facility] Main Entrance A at 5:30 A.M., then check in with the Admitting office. Any questions or running late day of surgery: call [phone]  Questions prior to your surgery date: call [phone], Monday-Friday, 8am-4pm. If you experience any cold or flu symptoms such as cough, fever, chills, shortness of breath, etc. between now and your scheduled surgery, please notify us  at the above number.     Remember:  Do not eat or drink after midnight the night before your surgery. This includes no gum, mints, or hard candy.   Take these medicines the morning of surgery with A SIP OF WATER: NONE  May take these medicines IF NEEDED: acetaminophen  (Tylenol )   One week prior to surgery, STOP taking any Aspirin (unless otherwise instructed by your surgeon) Aleve, Naproxen, Ibuprofen , Motrin , Advil , Goody's, BC's, all herbal medications, fish oil, and non-prescription vitamins.                     Do NOT Smoke (Tobacco/Vaping) for 24 hours prior to your procedure.  If you use a CPAP at night, you may bring your mask/headgear for your overnight stay.   You will be asked to remove any contacts, glasses, piercing's, hearing aid's, dentures/partials prior to surgery. Please bring cases for these items if needed.    Patients discharged the day of surgery will not be allowed to drive home, and someone needs to stay with them for 24 hours.  SURGICAL WAITING ROOM VISITATION Patients may have no more than 2 support people in the waiting area - these visitors may rotate.   Pre-op nurse will coordinate an appropriate time for 1 ADULT support person, who may not rotate, to accompany patient in pre-op.  Children under the age of 60 must have an adult with them who is not the patient and must remain in the main waiting area with an adult.  If the patient needs to stay at the hospital during part of their  recovery, the visitor guidelines for inpatient rooms apply.  Please refer to the [facility] website for the visitor guidelines for any additional information.   If you received a COVID test during your pre-op visit  it is requested that you wear a mask when out in public, stay away from anyone that may not be feeling well and notify your surgeon if you develop symptoms. If you have been in contact with anyone that has tested positive in the last 10 days please notify you surgeon.      Pre-operative CHG Bathing Instructions   You can play a key role in reducing the risk of infection after surgery. Your skin needs to be as free of germs as possible. You can reduce the number of germs on your skin by washing with CHG (chlorhexidine  gluconate) soap before surgery. CHG is an antiseptic soap that kills germs and continues to kill germs even after washing.   DO NOT use if you have an allergy to chlorhexidine /CHG or antibacterial soaps. If your skin becomes reddened or irritated, stop using the CHG and notify one of our RNs at [phone].              TAKE A SHOWER THE NIGHT BEFORE SURGERY   Please keep in mind the following:  DO NOT shave, including legs and underarms, 48 hours prior to surgery.   You may shave your face before/day of surgery.  Place clean sheets  on your bed the night before surgery Use a clean washcloth (not used since being washed) for shower. DO NOT sleep with pet's night before surgery.  CHG Shower Instructions:  Wash your face and private area with normal soap. If you choose to wash your hair, wash first with your normal shampoo.  After you use shampoo/soap, rinse your hair and body thoroughly to remove shampoo/soap residue.  Turn the water OFF and apply half the bottle of CHG soap to a CLEAN washcloth.  Apply CHG soap ONLY FROM YOUR NECK DOWN TO YOUR TOES (washing for 3-5 minutes)  DO NOT use CHG soap on face, private areas, open wounds, or sores.  Pay special  attention to the area where your surgery is being performed.  If you are having back surgery, having someone wash your back for you may be helpful. Wait 2 minutes after CHG soap is applied, then you may rinse off the CHG soap.  Pat dry with a clean towel  Put on clean pajamas    Additional instructions for the day of surgery: If you choose, you may shower the morning of surgery with an antibacterial soap.  DO NOT APPLY any lotions, deodorants, cologne, or perfumes.   Do not wear jewelry or makeup Do not wear nail polish, gel polish, artificial nails, or any other type of covering on natural nails (fingers and toes) Do not bring valuables to the hospital. [facility] is not responsible for valuables/personal belongings. Put on clean/comfortable clothes.  Please brush your teeth.  Ask your nurse before applying any prescription medications to the skin.

## 2024-05-12 ENCOUNTER — Other Ambulatory Visit: Payer: Self-pay

## 2024-05-12 ENCOUNTER — Ambulatory Visit (HOSPITAL_COMMUNITY)
Admission: RE | Admit: 2024-05-12 | Discharge: 2024-05-12 | Disposition: A | Discharge status: Home / Self Care | Source: Ambulatory Visit | Attending: Thoracic Surgery (Cardiothoracic Vascular Surgery) | Admitting: Thoracic Surgery (Cardiothoracic Vascular Surgery)

## 2024-05-12 ENCOUNTER — Encounter (HOSPITAL_COMMUNITY)
Admission: RE | Admit: 2024-05-12 | Discharge: 2024-05-12 | Disposition: A | Source: Ambulatory Visit | Attending: Thoracic Surgery (Cardiothoracic Vascular Surgery)

## 2024-05-12 ENCOUNTER — Encounter (HOSPITAL_COMMUNITY): Payer: Self-pay

## 2024-05-12 VITALS — BP 158/83 | HR 67 | Temp 97.9°F | Resp 18 | Ht 65.0 in | Wt 132.6 lb

## 2024-05-12 DIAGNOSIS — F17210 Nicotine dependence, cigarettes, uncomplicated: Secondary | ICD-10-CM | POA: Diagnosis not present

## 2024-05-12 DIAGNOSIS — Z01818 Encounter for other preprocedural examination: Secondary | ICD-10-CM | POA: Insufficient documentation

## 2024-05-12 DIAGNOSIS — E785 Hyperlipidemia, unspecified: Secondary | ICD-10-CM | POA: Diagnosis not present

## 2024-05-12 DIAGNOSIS — C7B8 Other secondary neuroendocrine tumors: Secondary | ICD-10-CM | POA: Diagnosis not present

## 2024-05-12 DIAGNOSIS — R911 Solitary pulmonary nodule: Secondary | ICD-10-CM | POA: Diagnosis not present

## 2024-05-12 DIAGNOSIS — I10 Essential (primary) hypertension: Secondary | ICD-10-CM | POA: Diagnosis not present

## 2024-05-12 DIAGNOSIS — R918 Other nonspecific abnormal finding of lung field: Secondary | ICD-10-CM

## 2024-05-12 DIAGNOSIS — J9382 Other air leak: Secondary | ICD-10-CM | POA: Diagnosis not present

## 2024-05-12 DIAGNOSIS — I471 Supraventricular tachycardia, unspecified: Secondary | ICD-10-CM | POA: Diagnosis not present

## 2024-05-12 DIAGNOSIS — C7801 Secondary malignant neoplasm of right lung: Secondary | ICD-10-CM | POA: Diagnosis not present

## 2024-05-12 DIAGNOSIS — I480 Paroxysmal atrial fibrillation: Secondary | ICD-10-CM | POA: Diagnosis not present

## 2024-05-12 LAB — URINALYSIS, ROUTINE W REFLEX MICROSCOPIC
Bilirubin Urine: NEGATIVE
Glucose, UA: NEGATIVE mg/dL
Ketones, ur: NEGATIVE mg/dL
Nitrite: POSITIVE — AB
Protein, ur: NEGATIVE mg/dL
Specific Gravity, Urine: 1.019 (ref 1.005–1.030)
pH: 5 (ref 5.0–8.0)

## 2024-05-12 LAB — TYPE AND SCREEN
ABO/RH(D): A POS
Antibody Screen: NEGATIVE

## 2024-05-12 LAB — CBC
HCT: 36.7 % (ref 36.0–46.0)
Hemoglobin: 12 g/dL (ref 12.0–15.0)
MCH: 30.2 pg (ref 26.0–34.0)
MCHC: 32.7 g/dL (ref 30.0–36.0)
MCV: 92.2 fL (ref 80.0–100.0)
Platelets: 245 K/uL (ref 150–400)
RBC: 3.98 MIL/uL (ref 3.87–5.11)
RDW: 12.8 % (ref 11.5–15.5)
WBC: 5.3 K/uL (ref 4.0–10.5)
nRBC: 0 % (ref 0.0–0.2)

## 2024-05-12 LAB — COMPREHENSIVE METABOLIC PANEL WITH GFR
ALT: 13 U/L (ref 0–44)
AST: 24 U/L (ref 15–41)
Albumin: 3.9 g/dL (ref 3.5–5.0)
Alkaline Phosphatase: 83 U/L (ref 38–126)
Anion gap: 9 (ref 5–15)
BUN: 10 mg/dL (ref 8–23)
CO2: 29 mmol/L (ref 22–32)
Calcium: 9.3 mg/dL (ref 8.9–10.3)
Chloride: 102 mmol/L (ref 98–111)
Creatinine, Ser: 0.69 mg/dL (ref 0.44–1.00)
GFR, Estimated: >60 mL/min (ref >60)
Glucose, Bld: 115 mg/dL — ABNORMAL HIGH (ref 70–99)
Potassium: 3.2 mmol/L — ABNORMAL LOW (ref 3.5–5.1)
Sodium: 140 mmol/L (ref 135–145)
Total Bilirubin: 0.6 mg/dL (ref 0.0–1.2)
Total Protein: 7.2 g/dL (ref 6.5–8.1)

## 2024-05-12 LAB — APTT: aPTT: 33 s (ref 24–36)

## 2024-05-12 LAB — SURGICAL PCR SCREEN
MRSA, PCR: NEGATIVE
Staphylococcus aureus: NEGATIVE

## 2024-05-12 LAB — PROTIME-INR
INR: 1 (ref 0.8–1.2)
Prothrombin Time: 13.9 s (ref 11.4–15.2)

## 2024-05-12 NOTE — Progress Notes (Signed)
 Bernardino Sprang was notified about patient's urinalysis.  Bernardino states that she notified Dr. Kerrin.

## 2024-05-12 NOTE — Progress Notes (Signed)
 Left IVM for Rosina, RN at dr. Chrystal office about abnormal U/a with a callback phone number.

## 2024-05-12 NOTE — Progress Notes (Signed)
 PCP - Thersia Stark, NP Cardiologist - denies Oncologist -Debby Olam POUR, NP   PPM/ICD - denies Device Orders - n/a Rep Notified - n/a  Chest x-ray - 05/12/2024 EKG - 05/12/2024 Stress Test - denies ECHO - denies Cardiac Cath - denies  Sleep Study -denies  CPAP - n/a  Fasting Blood Sugar - no DM Checks Blood Sugar _____ times a day  Last dose of GLP1 agonist-  n/a GLP1 instructions: n/a  Blood Thinner Instructions: n/a Aspirin Instructions: n/a  ERAS Protcol - no, NPO  PRE-SURGERY Ensure or G2- n/a  COVID TEST- n/a   Anesthesia review: yes, EKG review, hx of DVT, HTN, colon cancer  Patient denies shortness of breath, fever, cough and chest pain at PAT appointment   All instructions explained to the patient, with a verbal understanding of the material. Patient agrees to go over the instructions while at home for a better understanding. Patient also instructed to self quarantine after being tested for COVID-19. The opportunity to ask questions was provided.

## 2024-05-14 ENCOUNTER — Inpatient Hospital Stay (HOSPITAL_COMMUNITY): Payer: Self-pay

## 2024-05-14 ENCOUNTER — Inpatient Hospital Stay (HOSPITAL_COMMUNITY)
Admission: RE | Admit: 2024-05-14 | Discharge: 2024-05-22 | DRG: 829 | Disposition: A | Discharge status: Home / Self Care | Attending: Thoracic Surgery (Cardiothoracic Vascular Surgery) | Admitting: Thoracic Surgery (Cardiothoracic Vascular Surgery)

## 2024-05-14 ENCOUNTER — Encounter (HOSPITAL_COMMUNITY): Payer: Self-pay | Admitting: Thoracic Surgery (Cardiothoracic Vascular Surgery)

## 2024-05-14 ENCOUNTER — Inpatient Hospital Stay (HOSPITAL_COMMUNITY)

## 2024-05-14 ENCOUNTER — Inpatient Hospital Stay (HOSPITAL_COMMUNITY): Payer: Self-pay | Admitting: Vascular Surgery

## 2024-05-14 ENCOUNTER — Encounter (HOSPITAL_COMMUNITY)
Admission: RE | Disposition: A | Discharge status: Home / Self Care | Payer: Self-pay | Source: Home / Self Care | Attending: Thoracic Surgery (Cardiothoracic Vascular Surgery)

## 2024-05-14 ENCOUNTER — Other Ambulatory Visit: Payer: Self-pay

## 2024-05-14 DIAGNOSIS — C7B8 Other secondary neuroendocrine tumors: Secondary | ICD-10-CM | POA: Diagnosis present

## 2024-05-14 DIAGNOSIS — Z79899 Other long term (current) drug therapy: Secondary | ICD-10-CM

## 2024-05-14 DIAGNOSIS — Z85038 Personal history of other malignant neoplasm of large intestine: Secondary | ICD-10-CM | POA: Diagnosis not present

## 2024-05-14 DIAGNOSIS — I4891 Unspecified atrial fibrillation: Secondary | ICD-10-CM

## 2024-05-14 DIAGNOSIS — Z4682 Encounter for fitting and adjustment of non-vascular catheter: Secondary | ICD-10-CM | POA: Diagnosis not present

## 2024-05-14 DIAGNOSIS — C801 Malignant (primary) neoplasm, unspecified: Secondary | ICD-10-CM | POA: Diagnosis not present

## 2024-05-14 DIAGNOSIS — Z87442 Personal history of urinary calculi: Secondary | ICD-10-CM

## 2024-05-14 DIAGNOSIS — F17210 Nicotine dependence, cigarettes, uncomplicated: Secondary | ICD-10-CM

## 2024-05-14 DIAGNOSIS — Z86718 Personal history of other venous thrombosis and embolism: Secondary | ICD-10-CM | POA: Diagnosis not present

## 2024-05-14 DIAGNOSIS — C189 Malignant neoplasm of colon, unspecified: Secondary | ICD-10-CM | POA: Diagnosis not present

## 2024-05-14 DIAGNOSIS — Z886 Allergy status to analgesic agent status: Secondary | ICD-10-CM

## 2024-05-14 DIAGNOSIS — Z902 Acquired absence of lung [part of]: Principal | ICD-10-CM

## 2024-05-14 DIAGNOSIS — C7800 Secondary malignant neoplasm of unspecified lung: Secondary | ICD-10-CM | POA: Diagnosis not present

## 2024-05-14 DIAGNOSIS — C7801 Secondary malignant neoplasm of right lung: Secondary | ICD-10-CM | POA: Diagnosis not present

## 2024-05-14 DIAGNOSIS — R918 Other nonspecific abnormal finding of lung field: Secondary | ICD-10-CM

## 2024-05-14 DIAGNOSIS — J9382 Other air leak: Secondary | ICD-10-CM | POA: Diagnosis present

## 2024-05-14 DIAGNOSIS — I10 Essential (primary) hypertension: Secondary | ICD-10-CM

## 2024-05-14 DIAGNOSIS — Z8249 Family history of ischemic heart disease and other diseases of the circulatory system: Secondary | ICD-10-CM | POA: Diagnosis not present

## 2024-05-14 DIAGNOSIS — Z9221 Personal history of antineoplastic chemotherapy: Secondary | ICD-10-CM | POA: Diagnosis not present

## 2024-05-14 DIAGNOSIS — E785 Hyperlipidemia, unspecified: Secondary | ICD-10-CM | POA: Diagnosis not present

## 2024-05-14 DIAGNOSIS — Z9049 Acquired absence of other specified parts of digestive tract: Secondary | ICD-10-CM | POA: Diagnosis not present

## 2024-05-14 DIAGNOSIS — J939 Pneumothorax, unspecified: Secondary | ICD-10-CM | POA: Diagnosis not present

## 2024-05-14 DIAGNOSIS — I471 Supraventricular tachycardia, unspecified: Secondary | ICD-10-CM | POA: Diagnosis present

## 2024-05-14 DIAGNOSIS — Z880 Allergy status to penicillin: Secondary | ICD-10-CM | POA: Diagnosis not present

## 2024-05-14 DIAGNOSIS — I480 Paroxysmal atrial fibrillation: Secondary | ICD-10-CM | POA: Diagnosis not present

## 2024-05-14 DIAGNOSIS — Z803 Family history of malignant neoplasm of breast: Secondary | ICD-10-CM

## 2024-05-14 DIAGNOSIS — C7A8 Other malignant neuroendocrine tumors: Secondary | ICD-10-CM | POA: Diagnosis not present

## 2024-05-14 DIAGNOSIS — I9789 Other postprocedural complications and disorders of the circulatory system, not elsewhere classified: Secondary | ICD-10-CM | POA: Diagnosis not present

## 2024-05-14 DIAGNOSIS — Z48813 Encounter for surgical aftercare following surgery on the respiratory system: Secondary | ICD-10-CM | POA: Diagnosis not present

## 2024-05-14 DIAGNOSIS — J9819 Other pulmonary collapse: Secondary | ICD-10-CM | POA: Diagnosis not present

## 2024-05-14 DIAGNOSIS — J9811 Atelectasis: Secondary | ICD-10-CM | POA: Diagnosis not present

## 2024-05-14 HISTORY — PX: LYMPH NODE BIOPSY: SHX201

## 2024-05-14 HISTORY — PX: LOBECTOMY, LUNG, ROBOT-ASSISTED, USING VATS: SHX7607

## 2024-05-14 HISTORY — PX: INTERCOSTAL NERVE BLOCK: SHX5021

## 2024-05-14 SURGERY — LOBECTOMY, LUNG, ROBOT-ASSISTED, USING VATS
Anesthesia: General | Site: Chest | Laterality: Right

## 2024-05-14 MED ORDER — SUGAMMADEX SODIUM 200 MG/2ML IV SOLN
INTRAVENOUS | Status: DC | PRN
  Administered 2024-05-14 (×2): 25 mg via INTRAVENOUS
  Administered 2024-05-14 (×2): 50 mg via INTRAVENOUS

## 2024-05-14 MED ORDER — ONDANSETRON HCL 4 MG/2ML IJ SOLN: 4.0000 mg | Freq: Four times a day (QID) | INTRAMUSCULAR | Status: DC | PRN

## 2024-05-14 MED ORDER — PROPOFOL 10 MG/ML IV BOLUS
INTRAVENOUS | Status: AC
  Filled 2024-05-14: qty 20

## 2024-05-14 MED ORDER — LABETALOL HCL 5 MG/ML IV SOLN
INTRAVENOUS | Status: DC | PRN
  Administered 2024-05-14 (×3): 5 mg via INTRAVENOUS

## 2024-05-14 MED ORDER — FENTANYL CITRATE (PF) 100 MCG/2ML IJ SOLN: 25.0000 ug | INTRAMUSCULAR | Status: DC | PRN

## 2024-05-14 MED ORDER — TRAMADOL HCL 50 MG PO TABS
50.0000 mg | ORAL_TABLET | Freq: Four times a day (QID) | ORAL | Status: DC | PRN
  Administered 2024-05-15 – 2024-05-16 (×5): 100 mg via ORAL
  Filled 2024-05-14 (×8): qty 2

## 2024-05-14 MED ORDER — PHENYLEPHRINE HCL (PRESSORS) 10 MG/ML IV SOLN
INTRAVENOUS | Status: DC | PRN
  Administered 2024-05-14: 40 ug via INTRAVENOUS

## 2024-05-14 MED ORDER — LIDOCAINE HCL (CARDIAC) PF 100 MG/5ML IV SOSY
PREFILLED_SYRINGE | INTRAVENOUS | Status: DC | PRN
  Administered 2024-05-14: 40 mg via INTRATRACHEAL

## 2024-05-14 MED ORDER — FENTANYL CITRATE (PF) 250 MCG/5ML IJ SOLN
INTRAMUSCULAR | Status: DC | PRN
  Administered 2024-05-14: 50 ug via INTRAVENOUS
  Administered 2024-05-14: 100 ug via INTRAVENOUS

## 2024-05-14 MED ORDER — ACETAMINOPHEN 10 MG/ML IV SOLN: 1000.0000 mg | Freq: Once | INTRAVENOUS | Status: DC | PRN

## 2024-05-14 MED ORDER — MORPHINE SULFATE (PF) 2 MG/ML IV SOLN: 2.0000 mg | INTRAVENOUS | Status: DC | PRN

## 2024-05-14 MED ORDER — ORAL CARE MOUTH RINSE: 15.0000 mL | Freq: Once | OROMUCOSAL | Status: AC

## 2024-05-14 MED ORDER — BUPIVACAINE LIPOSOME 1.3 % IJ SUSP
INTRAMUSCULAR | Status: DC | PRN
  Administered 2024-05-14: 100 mL

## 2024-05-14 MED ORDER — LACTATED RINGERS IV SOLN: INTRAVENOUS | Status: DC | PRN

## 2024-05-14 MED ORDER — BUPIVACAINE LIPOSOME 1.3 % IJ SUSP
INTRAMUSCULAR | Status: AC
  Filled 2024-05-14: qty 20

## 2024-05-14 MED ORDER — DEXAMETHASONE SOD PHOSPHATE PF 10 MG/ML IJ SOLN
INTRAMUSCULAR | Status: DC | PRN
  Administered 2024-05-14: 10 mg via INTRAVENOUS

## 2024-05-14 MED ORDER — LUNG SURGERY BOOK
Freq: Once | Status: AC
  Filled 2024-05-14: qty 1

## 2024-05-14 MED ORDER — ACETAMINOPHEN 10 MG/ML IV SOLN
INTRAVENOUS | Status: DC | PRN
  Administered 2024-05-14: 1000 mg via INTRAVENOUS

## 2024-05-14 MED ORDER — FENTANYL CITRATE (PF) 250 MCG/5ML IJ SOLN
INTRAMUSCULAR | Status: AC
  Filled 2024-05-14: qty 5

## 2024-05-14 MED ORDER — CEFAZOLIN SODIUM-DEXTROSE 2-4 GM/100ML-% IV SOLN
2.0000 g | Freq: Three times a day (TID) | INTRAVENOUS | Status: AC
  Administered 2024-05-14 (×2): 2 g via INTRAVENOUS
  Filled 2024-05-14 (×2): qty 100

## 2024-05-14 MED ORDER — SODIUM CHLORIDE 0.9 % IV SOLN
INTRAVENOUS | Status: AC | PRN
  Administered 2024-05-14: 1000 mL via INTRAMUSCULAR

## 2024-05-14 MED ORDER — SODIUM CHLORIDE (PF) 0.9 % IJ SOLN
INTRAMUSCULAR | Status: AC
  Filled 2024-05-14: qty 50

## 2024-05-14 MED ORDER — CHLORHEXIDINE GLUCONATE 0.12 % MT SOLN
15.0000 mL | Freq: Once | OROMUCOSAL | Status: AC
  Administered 2024-05-14: 15 mL via OROMUCOSAL

## 2024-05-14 MED ORDER — LACTATED RINGERS IV SOLN: INTRAVENOUS | Status: DC

## 2024-05-14 MED ORDER — OXYCODONE HCL 5 MG PO TABS
5.0000 mg | ORAL_TABLET | ORAL | Status: DC | PRN
  Administered 2024-05-14 – 2024-05-17 (×3): 10 mg via ORAL
  Administered 2024-05-17 – 2024-05-19 (×5): 5 mg via ORAL
  Administered 2024-05-19: 10 mg via ORAL
  Filled 2024-05-14 (×2): qty 1
  Filled 2024-05-14 (×2): qty 2
  Filled 2024-05-14 (×3): qty 1
  Filled 2024-05-14 (×2): qty 2

## 2024-05-14 MED ORDER — ENOXAPARIN SODIUM 40 MG/0.4ML IJ SOSY
40.0000 mg | PREFILLED_SYRINGE | Freq: Every day | INTRAMUSCULAR | Status: DC
  Administered 2024-05-15 – 2024-05-20 (×5): 40 mg via SUBCUTANEOUS
  Filled 2024-05-14 (×5): qty 0.4

## 2024-05-14 MED ORDER — HEMOSTATIC AGENTS (NO CHARGE) OPTIME
TOPICAL | Status: DC | PRN
  Administered 2024-05-14: 1 via TOPICAL

## 2024-05-14 MED ORDER — PHENYLEPHRINE HCL-NACL 20-0.9 MG/250ML-% IV SOLN
INTRAVENOUS | Status: DC | PRN
  Administered 2024-05-14: 25 ug/min via INTRAVENOUS

## 2024-05-14 MED ORDER — GABAPENTIN 300 MG PO CAPS
300.0000 mg | ORAL_CAPSULE | Freq: Two times a day (BID) | ORAL | Status: DC
  Administered 2024-05-17 – 2024-05-22 (×10): 300 mg via ORAL
  Filled 2024-05-14 (×10): qty 1

## 2024-05-14 MED ORDER — SENNOSIDES-DOCUSATE SODIUM 8.6-50 MG PO TABS
1.0000 | ORAL_TABLET | Freq: Every day | ORAL | Status: DC
  Administered 2024-05-14 – 2024-05-16 (×3): 1 via ORAL
  Filled 2024-05-14 (×3): qty 1

## 2024-05-14 MED ORDER — PROPOFOL 10 MG/ML IV BOLUS
INTRAVENOUS | Status: DC | PRN
  Administered 2024-05-14: 20 mg via INTRAVENOUS
  Administered 2024-05-14: 90 mg via INTRAVENOUS
  Administered 2024-05-14: 40 mg via INTRAVENOUS

## 2024-05-14 MED ORDER — PANTOPRAZOLE SODIUM 40 MG PO TBEC
40.0000 mg | DELAYED_RELEASE_TABLET | Freq: Every day | ORAL | Status: DC
  Administered 2024-05-15 – 2024-05-22 (×8): 40 mg via ORAL
  Filled 2024-05-14 (×8): qty 1

## 2024-05-14 MED ORDER — VANCOMYCIN HCL IN DEXTROSE 1-5 GM/200ML-% IV SOLN
1000.0000 mg | INTRAVENOUS | Status: AC
  Administered 2024-05-14: 1000 mg via INTRAVENOUS
  Filled 2024-05-14: qty 200

## 2024-05-14 MED ORDER — GABAPENTIN 300 MG PO CAPS
300.0000 mg | ORAL_CAPSULE | Freq: Every day | ORAL | Status: AC
  Administered 2024-05-14 – 2024-05-16 (×3): 300 mg via ORAL
  Filled 2024-05-14 (×3): qty 1

## 2024-05-14 MED ORDER — ONDANSETRON HCL 4 MG/2ML IJ SOLN
INTRAMUSCULAR | Status: DC | PRN
  Administered 2024-05-14: 4 mg via INTRAVENOUS

## 2024-05-14 MED ORDER — ROCURONIUM BROMIDE 100 MG/10ML IV SOLN
INTRAVENOUS | Status: DC | PRN
  Administered 2024-05-14 (×2): 20 mg via INTRAVENOUS
  Administered 2024-05-14: 70 mg via INTRAVENOUS

## 2024-05-14 MED ORDER — OXYCODONE HCL 5 MG PO TABS: 5.0000 mg | ORAL_TABLET | Freq: Once | ORAL | Status: DC | PRN

## 2024-05-14 MED ORDER — OXYCODONE HCL 5 MG/5ML PO SOLN: 5.0000 mg | Freq: Once | ORAL | Status: DC | PRN

## 2024-05-14 MED ORDER — SODIUM CHLORIDE 0.45 % IV SOLN: INTRAVENOUS | Status: AC

## 2024-05-14 MED ORDER — BUPIVACAINE HCL (PF) 0.5 % IJ SOLN
INTRAMUSCULAR | Status: AC
  Filled 2024-05-14: qty 30

## 2024-05-14 MED ORDER — ALBUMIN HUMAN 5 % IV SOLN: INTRAVENOUS | Status: DC | PRN

## 2024-05-14 MED ORDER — BISACODYL 5 MG PO TBEC
10.0000 mg | DELAYED_RELEASE_TABLET | Freq: Every day | ORAL | Status: DC
  Administered 2024-05-14 – 2024-05-16 (×3): 10 mg via ORAL
  Filled 2024-05-14 (×3): qty 2

## 2024-05-14 MED ORDER — 0.9 % SODIUM CHLORIDE (POUR BTL) OPTIME
TOPICAL | Status: DC | PRN
  Administered 2024-05-14: 2000 mL

## 2024-05-14 MED ORDER — ACETAMINOPHEN 500 MG PO TABS
1000.0000 mg | ORAL_TABLET | Freq: Four times a day (QID) | ORAL | Status: AC
  Administered 2024-05-14 – 2024-05-19 (×17): 1000 mg via ORAL
  Filled 2024-05-14 (×17): qty 2

## 2024-05-14 MED ORDER — ACETAMINOPHEN 160 MG/5ML PO SOLN: 1000.0000 mg | Freq: Four times a day (QID) | ORAL | Status: AC

## 2024-05-14 MED ORDER — CHLORHEXIDINE GLUCONATE 0.12 % MT SOLN
15.0000 mL | Freq: Once | OROMUCOSAL | Status: AC
  Administered 2024-05-14: 15 mL via OROMUCOSAL
  Filled 2024-05-14: qty 15

## 2024-05-14 SURGICAL SUPPLY — 71 items
BLADE CLIPPER SURG (BLADE) ×2 IMPLANT
CANISTER SUCTION 3000ML PPV (SUCTIONS) ×4 IMPLANT
CANNULA REDUCER 12-8 DVNC XI (CANNULA) ×4 IMPLANT
CATH THORACIC 28FR (CATHETERS) IMPLANT
CATH THORACIC 28FR RT ANG (CATHETERS) IMPLANT
CATH THORACIC 36FR (CATHETERS) IMPLANT
CATH THORACIC 36FR RT ANG (CATHETERS) IMPLANT
CNTNR URN SCR LID CUP LEK RST (MISCELLANEOUS) ×10 IMPLANT
CONN ST 1/4X3/8 BEN (MISCELLANEOUS) IMPLANT
DEFOGGER SCOPE WARM SEASHARP (MISCELLANEOUS) ×2 IMPLANT
DERMABOND ADVANCED .7 DNX12 (GAUZE/BANDAGES/DRESSINGS) ×2 IMPLANT
DRAIN CHANNEL 28F RND 3/8 FF (WOUND CARE) ×2 IMPLANT
DRAPE ARM DVNC X/XI (DISPOSABLE) ×8 IMPLANT
DRAPE COLUMN DVNC XI (DISPOSABLE) ×2 IMPLANT
DRAPE CV SPLIT W-CLR ANES SCRN (DRAPES) ×2 IMPLANT
DRAPE INCISE IOBAN 66X45 STRL (DRAPES) IMPLANT
DRAPE SURG ORHT 6 SPLT 77X108 (DRAPES) ×2 IMPLANT
ELECT BLADE 6.5 EXT (BLADE) ×2 IMPLANT
ELECTRODE REM PT RTRN 9FT ADLT (ELECTROSURGICAL) ×2 IMPLANT
FORCEPS BPLR FENES DVNC XI (FORCEP) IMPLANT
FORCEPS BPLR LNG DVNC XI (INSTRUMENTS) IMPLANT
GAUZE 4X4 16PLY ~~LOC~~+RFID DBL (SPONGE) ×2 IMPLANT
GAUZE KITTNER 4X5 RF (MISCELLANEOUS) IMPLANT
GAUZE SPONGE 4X4 12PLY STRL (GAUZE/BANDAGES/DRESSINGS) ×2 IMPLANT
GLOVE SS BIOGEL STRL SZ 7.5 (GLOVE) ×6 IMPLANT
GLOVE SURG POLYISO LF SZ8 (GLOVE) ×2 IMPLANT
GLOVE SURG SIGNA 7.5 PF LTX (GLOVE) ×6 IMPLANT
GLOVE SURG SS PI 8.0 STRL IVOR (GLOVE) ×2 IMPLANT
GOWN STRL REUS W/ TWL LRG LVL3 (GOWN DISPOSABLE) ×4 IMPLANT
GOWN STRL REUS W/ TWL XL LVL3 (GOWN DISPOSABLE) ×4 IMPLANT
GOWN STRL REUS W/TWL 2XL LVL3 (GOWN DISPOSABLE) ×2 IMPLANT
GRASPER TIP-UP FEN DVNC XI (INSTRUMENTS) IMPLANT
HEMOSTAT SURGICEL 2X14 (HEMOSTASIS) ×6 IMPLANT
IRRIGATION STRYKERFLOW (MISCELLANEOUS) ×2 IMPLANT
KIT BASIN OR (CUSTOM PROCEDURE TRAY) ×2 IMPLANT
KIT TURNOVER KIT B (KITS) ×2 IMPLANT
NDL HYPO 25GX1X1/2 BEV (NEEDLE) ×2 IMPLANT
NDL SPNL 22GX3.5 QUINCKE BK (NEEDLE) ×2 IMPLANT
NEEDLE HYPO 25GX1X1/2 BEV (NEEDLE) ×2 IMPLANT
NEEDLE SPNL 22GX3.5 QUINCKE BK (NEEDLE) ×2 IMPLANT
PACK CHEST (CUSTOM PROCEDURE TRAY) ×2 IMPLANT
PAD ARMBOARD POSITIONER FOAM (MISCELLANEOUS) ×4 IMPLANT
PAD ELECT DEFIB RADIOL ZOLL (MISCELLANEOUS) IMPLANT
PORT ACCESS TROCAR AIRSEAL 12 (TROCAR) ×2 IMPLANT
RELOAD STAPLE 45 2.5 WHT DVNC (STAPLE) IMPLANT
RELOAD STAPLE 45 3.5 BLU DVNC (STAPLE) IMPLANT
RELOAD STAPLE 45 4.3 GRN DVNC (STAPLE) IMPLANT
SEAL UNIV 5-12 XI (MISCELLANEOUS) ×8 IMPLANT
SEALER SYNCHRO 8 IS4000 DVNC (MISCELLANEOUS) IMPLANT
SET TRI-LUMEN FLTR TB AIRSEAL (TUBING) ×2 IMPLANT
SOLN 0.9% NACL POUR BTL 1000ML (IV SOLUTION) ×6 IMPLANT
SOLN STERILE WATER BTL 1000 ML (IV SOLUTION) ×4 IMPLANT
SOLUTION ELECTROSURG ANTI STCK (MISCELLANEOUS) ×2 IMPLANT
SPONGE TONSIL 1 RF SGL (DISPOSABLE) ×2 IMPLANT
STAPLER 45 SUREFORM CVD DVNC (STAPLE) IMPLANT
SUT SILK 1 MH (SUTURE) ×4 IMPLANT
SUT VIC AB 1 CTX36XBRD ANBCTR (SUTURE) ×2 IMPLANT
SUT VIC AB 2-0 CTX 36 (SUTURE) ×2 IMPLANT
SUT VIC AB 3-0 X1 27 (SUTURE) ×4 IMPLANT
SUT VICRYL 0 TIES 12 18 (SUTURE) ×2 IMPLANT
SUT VICRYL 0 UR6 27IN ABS (SUTURE) ×4 IMPLANT
SYR 10ML LL (SYRINGE) ×2 IMPLANT
SYR 20CC LL (SYRINGE) ×4 IMPLANT
SYR 50ML LL SCALE MARK (SYRINGE) ×2 IMPLANT
SYSTEM RETRIEVAL ANCHOR 15 (MISCELLANEOUS) IMPLANT
SYSTEM SAHARA CHEST DRAIN ATS (WOUND CARE) ×2 IMPLANT
TAPE CLOTH 4X10 WHT NS (GAUZE/BANDAGES/DRESSINGS) ×2 IMPLANT
TAPE CLOTH SURG 4X10 WHT LF (GAUZE/BANDAGES/DRESSINGS) IMPLANT
TOWEL GREEN STERILE (TOWEL DISPOSABLE) ×4 IMPLANT
TOWEL GREEN STERILE FF (TOWEL DISPOSABLE) IMPLANT
TRAY FOLEY W/BAG SLVR 14FR (SET/KITS/TRAYS/PACK) IMPLANT

## 2024-05-14 NOTE — Plan of Care (Signed)

## 2024-05-14 NOTE — Anesthesia Procedure Notes (Signed)
 Procedure Name: Intubation Date/Time: 05/14/2024 8:15 AM  Performed by: Arvell Edsel HERO, CRNAPre-anesthesia Checklist: Patient identified, Emergency Drugs available, Suction available, Patient being monitored and Timeout performed Patient Re-evaluated:Patient Re-evaluated prior to induction Oxygen Delivery Method: Circle system utilized Preoxygenation: Pre-oxygenation with 100% oxygen Induction Type: IV induction Ventilation: Mask ventilation without difficulty and Oral airway inserted - appropriate to patient size Laryngoscope Size: Mac and 3 Grade View: Grade I Tube type: Oral Endobronchial tube: 37 Fr Number of attempts: 1 Airway Equipment and Method: Patient positioned with wedge pillow and Stylet Placement Confirmation: ETT inserted through vocal cords under direct vision, positive ETCO2 and breath sounds checked- equal and bilateral Tube secured with: Tape Dental Injury: Teeth and Oropharynx as per pre-operative assessment

## 2024-05-14 NOTE — Interval H&P Note (Signed)
 History and Physical Interval Note:  05/14/2024 7:49 AM  [name]  has presented today for surgery, with the diagnosis of RIGHT UPPER AND MIDDLE LOBE MASS.  The various methods of treatment have been discussed with the patient and family. After consideration of risks, benefits and other options for treatment, the patient has consented to  Procedure(s) with comments: LOBECTOMY, LUNG, ROBOT-ASSISTED, USING VATS (Right) - Robotic assisted right upper and middle bilobectomy, possible thoracotomy, possible chest wall resection THORACOTOMY, MAJOR (Right) - possible thoracotomy EXCISION, MASS, MEDIASTINUM, ROBOT-ASSISTED (Right) - possible chest wall resection as a surgical intervention.  The patient's history has been reviewed, patient examined, no change in status, stable for surgery.  I have reviewed the patient's chart and labs.  Questions were answered to the patient's satisfaction.     [name]
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
## 2024-05-14 NOTE — Brief Op Note (Signed)
 05/14/2024  11:49 AM  PATIENT:  [name]  78 y.o. female  PRE-OPERATIVE DIAGNOSIS:  RIGHT UPPER AND MIDDLE LOBE MASS  POST-OPERATIVE DIAGNOSIS:  RIGHT UPPER AND MIDDLE LOBE MASS  PROCEDURE:  Procedure(s) with comments: LOBECTOMY, LUNG, ROBOT-ASSISTED, USING VATS (Right) - Robotic assisted right upper and middle bilobectomy  SURGEON:  Surgeons and Role:    DEWAINE Kerrin Elspeth JAYSON, MD - Primary  PHYSICIAN ASSISTANT: Con Bend PA-C   ASSISTANTS: none   ANESTHESIA:   local and general  EBL:  100 mL   BLOOD ADMINISTERED:none  DRAINS: right pleural drain   LOCAL MEDICATIONS USED:  OTHER Exparel   SPECIMEN:  Source of Specimen:  right upper and middle lobe, anterior chest wall margin, deep chest wall margin, posterior superior chest wall margin, lymph nodes  DISPOSITION OF SPECIMEN:  PATHOLOGY  COUNTS:  YES  TOURNIQUET:  * No tourniquets in log *  DICTATION: .Dragon Dictation  PLAN OF CARE: Admit to inpatient   PATIENT DISPOSITION:  PACU - hemodynamically stable.   Delay start of Pharmacological VTE agent (>24hrs) due to surgical blood loss or risk of bleeding: no

## 2024-05-14 NOTE — Discharge Instructions (Addendum)
 Discharge Instructions:  1. You may shower, please wash incisions daily with soap and water and keep dry.  If you wish to cover wounds with dressing you may do so but please keep clean and change daily.  No tub baths or swimming until incisions have completely healed.  If your incisions become red or develop any drainage please call our office at [phone]  2. No Driving until cleared by Dr. Chrystal office and you are no longer using narcotic pain medications  3. Fever of 101.5 for at least 24 hours with no source, please contact our office at [phone]  4. Activity- up as tolerated, please walk at least 3 times per day.  Avoid strenuous activity until cleared by Dr. Chrystal office  5. If any questions or concerns arise, please do not hesitate to contact our office at [phone]     Information on my medicine - ELIQUIS  (apixaban )  Why was Eliquis  prescribed for you? Eliquis  was prescribed for you to reduce the risk of a blood clot forming that can cause a stroke if you have a medical condition called atrial fibrillation (a type of irregular heartbeat).  What do You need to know about Eliquis  ? Take your Eliquis  TWICE DAILY - one tablet in the morning and one tablet in the evening with or without food. If you have difficulty swallowing the tablet whole please discuss with your pharmacist how to take the medication safely.  Take Eliquis  exactly as prescribed by your doctor and DO NOT stop taking Eliquis  without talking to the doctor who prescribed the medication.  Stopping may increase your risk of developing a stroke.  Refill your prescription before you run out.  After discharge, you should have regular check-up appointments with your healthcare provider that is prescribing your Eliquis .  In the future your dose may need to be changed if your kidney function or weight changes by a significant amount or as you get older.  What do you do if you miss a dose? If  you miss a dose, take it as soon as you remember on the same day and resume taking twice daily.  Do not take more than one dose of ELIQUIS  at the same time to make up a missed dose.  Important Safety Information A possible side effect of Eliquis  is bleeding. You should call your healthcare provider right away if you experience any of the following: Bleeding from an injury or your nose that does not stop. Unusual colored urine (red or dark brown) or unusual colored stools (red or black). Unusual bruising for unknown reasons. A serious fall or if you hit your head (even if there is no bleeding).  Some medicines may interact with Eliquis  and might increase your risk of bleeding or clotting while on Eliquis . To help avoid this, consult your healthcare provider or pharmacist prior to using any new prescription or non-prescription medications, including herbals, vitamins, non-steroidal anti-inflammatory drugs (NSAIDs) and supplements.  This website has more information on Eliquis  (apixaban ): http://[email or web address] .com/eliquis dena
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
## 2024-05-14 NOTE — Op Note (Signed)
 NAME: [name], [name]. MEDICAL RECORD NO: [id number] ACCOUNT NO: [id number] DATE OF BIRTH: 11/08/1945 FACILITY: [facility] LOCATION: [facility] PHYSICIAN: Elspeth BROCKS. Kerrin, MD  Operative Report   DATE OF PROCEDURE: 05/14/2024  PREOPERATIVE DIAGNOSIS:  Metastatic colon cancer to the right upper and middle lobes.  POSTOPERATIVE DIAGNOSIS:  Metastatic colon cancer to the right upper and middle lobes.  PROCEDURE:   XI robotic-assisted right upper and middle bilobectomy with extrapleural resection at site of tumor,  Lymph node dissection, and  Intercostal nerve blocks levels 3 through 10.  SURGEON:  Elspeth BROCKS. Kerrin, MD  ASSISTANT:  Con Bend, PA.  Experienced assistance was necessary for this case due to surgical complexity.  Con Bend assisted with port placement, robot docking and undocking, instrument exchange, specimen retrieval, suctioning, and wound closure.  ANESTHESIA:  General.  FINDINGS:  Mass with adhesions to chest wall.  Frozen section of anterior, posterior, superior, and deep margins negative for tumor.  Level 11 node negative for tumor.  Bronchial margin benign.  CLINICAL NOTE:  [name] is a 78 year old woman with a history of colon cancer back in 2013.  She was found to have a right upper lobe mass in February of this year.  There also was a 10 mm left upper lobe nodule that had been stable for several years.  She saw [name] recently and was found to have the mass had increased in size to 4.7 x 2.9 cm.  She underwent biopsy which showed adenocarcinoma consistent with metastatic colon adenocarcinoma.  PET CT showed no evidence of other disease.  She was offered surgical resection but given the size of the tumor it was felt she likely would need a bilobectomy to accomplish resection.  She understood the nature of the procedure, she understood the risks, and agreed to proceed.  OPERATIVE NOTE:  [name] was brought to the operating room on 05/14/2024.   She had induction of general anesthesia and was intubated with a double-lumen endotracheal tube.  Intravenous antibiotics were administered, Foley catheter was placed,  sequential compression devices were placed on the calves for DVT prophylaxis.  She was placed in a left lateral decubitus position.  A Bair Hugger was placed for active warming, the right chest was prepped and draped in the usual sterile fashion.   Single-lung ventilation of the left lung was initiated and was tolerated well throughout the procedure.  A time-out was performed.  A solution containing 20 mL of liposomal bupivacaine , 30 mL of 0.5% bupivacaine , and 50 mL of saline was prepared.  This solution was used for local at the incision sites as well as for the intercostal nerve blocks.  An  incision was made in the 8th interspace in the midaxillary line, an 8 mm robotic port was inserted.  After confirming intrapleural placement, carbon dioxide was insufflated per protocol.  A 12 mm robotic port was placed in the 8th interspace anteriorly.  Intercostal nerve blocks were performed from the 3rd to the 10th interspace by injecting 10 mL of the bupivacaine  solution into a subpleural plane at each level.  There was noted that there were some adhesions posteriorly and there were adhesions in the vicinity of the tumor on the lateral chest wall.  A 12 mm AirSeal port was placed in the 10th interspace posterolaterally and 2 additional robotic ports were placed in the 8th interspace.  The robot was deployed, the camera arm was docked, targeting was performed, the remaining arms were docked, the robotic instruments were inserted under  thoracoscopic visualization.  Dissection was begun at the chest wall.  There were adhesions to the chest wall.  A plane was developed extra pleurally maintaining a 2 cm gross margin around the adhesions.  There was no invasion of the ribs.  Between the ribs, the dissection was carried into the intercostal musculature  to ensure a clear margin.  Biopsies were taken from the anterior, posterior, superior, and deep portions of the chest wall after completion and sent for frozen section, all of which returned negative for tumor.  Lung was retracted superiorly, the inferior pulmonary ligament was divided, level 9 and 8 nodes were removed.  The pleural reflection was divided at the hilum posteriorly.  Working superiorly into the subcarinal space, there was a relatively large but benign appearing lymph node.  This node was very vascular and bled easily.  A second node was removed and then the area was packed with Surgicel.  The dissection was carried up along the bronchus to the bifurcation of the right upper lobe bronchus from the bronchus intermedius and there were 2 level 11 nodes.  The first was benign appearing, the second one was more grayish in appearance and concerning.  It was sent for pathology but returned with no tumor seen.  Lung was retracted inferiorly, the pleura was incised superior to the azygos vein and a level 4 node was removed, this node was benign in appearance.  The pleura then was divided between the azygos vein and the hilum and a level 10 node was removed.  Dissection was begun around the pulmonary artery branches superiorly and then the lung was retracted posteriorly and the dissection continued along the pulmonary vein.  The phrenic nerve was identified and no cautery was used in its vicinity.    Attention then was turned to the fissure.  There was a visible node between the lower and middle lobes, which was removed.  This exposed the basilar segmental branches of the pulmonary artery.  Dissection was carried from anterior to posterior and a  plane was developed along the pulmonary artery.  The fissure was completed posteriorly with sequential firings of the robotic stapler.  At this point, there was a small posterior ascending branch that was visible.  This was encircled and divided with the robotic  stapler.  Working anteriorly as additional nodes were removed, the lateral middle lobe branch of the pulmonary artery was identified, it was divided with the robotic stapler.  Working along the medial basilar segmental pulmonary artery, the  majority of the fissure anteriorly was completed with bipolar cautery, only a very small area required stapling.  There was a very small, approximately 2 mm, pulmonary artery branch from the anterior basilar segmental artery to the middle lobe.  It was  divided with the SynchroSeal device.  Consideration was given to trying to do a wedge or some combination segmentectomy but there was a large level 11 node between the right upper lobe bronchus/bronchus intermedius and pulmonary artery.  Decision was  made to proceed with bilobectomy as discussed with the patient preoperatively.  The superior pulmonary vein was encircled, the middle lobe branches were divided first and then the remainder of the upper lobe branches were encircled and divided with the  robotic stapler.  The medial middle lobe pulmonary artery branch was encircled and divided and then the stapler was placed across the right middle lobe bronchus at its origin and closed.  A test inflation did show aeration in the lower lobe and the stapler  was fired, transecting the right middle lobe bronchus.  Dissection continued on the enlarged node previously described.  As that progressed, the anterior and apical branches of the pulmonary artery to the upper lobe were encircled and divided.  Finally, the stapler was placed across the right upper lobe bronchus at its origin and closed.  A test inflation showed good aeration of the lower lobe, and the stapler was fired, transecting the bronchus.  The vessel loop and sponges used during the dissection were removed.  The chest was copiously irrigated with saline, a test inflation to 30 cm of water pressure revealed no air leakage from the bronchial stump or the staple lines.   The robotic instruments were removed, the robot was undocked.  The anterior 8th interspace incision was lengthened to 4 cm.  A 15 mm endoscopic retrieval bag was placed into the chest, the upper and middle lobes were placed into the retrieval bag which was then removed and sent for frozen on the bronchial margins which returned with no tumor seen.  All the port sites were inspected and had good hemostasis.  A 28 French Blake drain was placed through the original port incision and directed to the apex, it was secured with a #1 silk suture.  The remaining incisions were closed in standard fashion.  All  sponge, needle, and instrument counts were correct at the end of the procedure.  The patient was extubated in the operating room and taken to the postanesthesia care unit in good condition.    MUK D: 05/14/2024 5:51:20 pm T: 05/14/2024 9:08:00 pm  JOB: [id number]/ [id number]

## 2024-05-14 NOTE — Anesthesia Preprocedure Evaluation (Signed)
 Anesthesia Evaluation  Patient identified by MRN, date of birth, ID band Patient awake    Reviewed: Allergy & Precautions, NPO status , Patient's Chart, lab work & pertinent test results  History of Anesthesia Complications Negative for: history of anesthetic complications  Airway Mallampati: IV  TM Distance: <3 FB Neck ROM: Full    Dental  (+) Dental Advisory Given, Loose,    Pulmonary Current Smoker and Patient abstained from smoking.   breath sounds clear to auscultation       Cardiovascular (-) angina (-) CAD, (-) Past MI and (-) CHF  Rhythm:Regular     Neuro/Psych    GI/Hepatic negative GI ROS, Neg liver ROS,,,  Endo/Other    Renal/GU negative Renal ROSLab Results      Component                Value               Date                      NA                       140                 05/12/2024                K                        3.2 (L)             05/12/2024                CO2                      29                  05/12/2024                GLUCOSE                  115 (H)             05/12/2024                BUN                      10                  05/12/2024                CREATININE               0.69                05/12/2024                CALCIUM                   9.3                 05/12/2024                GFR                      89.83               07/10/2018  EGFR                     >90                 10/14/2014                GFRNONAA                 >60                 05/12/2024                Musculoskeletal negative musculoskeletal ROS (+)    Abdominal   Peds  Hematology negative hematology ROS (+) Lab Results      Component                Value               Date                      WBC                      5.3                 05/12/2024                HGB                      12.0                05/12/2024                HCT                      36.7                 05/12/2024                MCV                      92.2                05/12/2024                PLT                      245                 05/12/2024              Anesthesia Other Findings   Reproductive/Obstetrics                              Anesthesia Physical Anesthesia Plan  ASA: 3  Anesthesia Plan: General   Post-op Pain Management: Ofirmev  IV (intra-op)*   Induction: Intravenous  PONV Risk Score and Plan: 3 and Ondansetron  and Dexamethasone   Airway Management Planned: Double Lumen EBT  Additional Equipment: Arterial line  Intra-op Plan:   Post-operative Plan: Extubation in OR and Possible Post-op intubation/ventilation  Informed Consent: I have reviewed the patients History and Physical, chart, labs and discussed the procedure including the risks, benefits and alternatives for the proposed anesthesia with the patient or authorized representative who has indicated his/her understanding and acceptance.     Dental advisory given  Plan Discussed with: CRNA  Anesthesia Plan Comments:          Anesthesia Quick Evaluation

## 2024-05-14 NOTE — Anesthesia Procedure Notes (Signed)
 Arterial Line Insertion Start/End11/20/2025 7:57 AM, 05/14/2024 7:57 AM Performed by: Lettie Derrek Dacosta, CRNA, CRNA  Patient location: Pre-op. Preanesthetic checklist: patient identified, IV checked, site marked, risks and benefits discussed, surgical consent, monitors and equipment checked, pre-op evaluation, timeout performed and anesthesia consent Lidocaine  1% used for infiltration Left, radial was placed Catheter size: 20 G Hand hygiene performed , maximum sterile barriers used  and Seldinger technique used Allen's test indicative of satisfactory collateral circulation Attempts: 1 Following insertion, dressing applied and Biopatch. Post procedure assessment: normal  Patient tolerated the procedure well with no immediate complications.

## 2024-05-14 NOTE — Transfer of Care (Signed)
 Immediate Anesthesia Transfer of Care Note  Patient: [name]  Procedure(s) Performed: LOBECTOMY, LUNG, ROBOT-ASSISTED, USING VATS (Right: Chest)  Patient Location: PACU  Anesthesia Type:General  Level of Consciousness: drowsy and patient cooperative  Airway & Oxygen Therapy: Patient Spontanous Breathing and Patient connected to face mask oxygen  Post-op Assessment: Report given to RN, Post -op Vital signs reviewed and stable, Patient moving all extremities, and Patient moving all extremities X 4  Post vital signs: Reviewed and stable  Last Vitals:  Vitals Value Taken Time  BP 156/79 05/14/24 12:01  Temp    Pulse 64 05/14/24 12:06  Resp 17 05/14/24 12:06  SpO2 100 05/14/24 1200  Vitals shown include unfiled device data.  Last Pain:  Vitals:   05/14/24 0618  TempSrc:   PainSc: 0-No pain         Complications: No notable events documented.

## 2024-05-15 ENCOUNTER — Inpatient Hospital Stay (HOSPITAL_COMMUNITY)

## 2024-05-15 ENCOUNTER — Encounter (HOSPITAL_COMMUNITY): Payer: Self-pay | Admitting: Thoracic Surgery (Cardiothoracic Vascular Surgery)

## 2024-05-15 DIAGNOSIS — Z4682 Encounter for fitting and adjustment of non-vascular catheter: Secondary | ICD-10-CM | POA: Diagnosis not present

## 2024-05-15 DIAGNOSIS — Z48813 Encounter for surgical aftercare following surgery on the respiratory system: Secondary | ICD-10-CM | POA: Diagnosis not present

## 2024-05-15 DIAGNOSIS — J939 Pneumothorax, unspecified: Secondary | ICD-10-CM | POA: Diagnosis not present

## 2024-05-15 DIAGNOSIS — Z902 Acquired absence of lung [part of]: Secondary | ICD-10-CM

## 2024-05-15 LAB — CBC
HCT: 30.4 % — ABNORMAL LOW (ref 36.0–46.0)
Hemoglobin: 9.9 g/dL — ABNORMAL LOW (ref 12.0–15.0)
MCH: 29.5 pg (ref 26.0–34.0)
MCHC: 32.6 g/dL (ref 30.0–36.0)
MCV: 90.5 fL (ref 80.0–100.0)
Platelets: 187 K/uL (ref 150–400)
RBC: 3.36 MIL/uL — ABNORMAL LOW (ref 3.87–5.11)
RDW: 12.8 % (ref 11.5–15.5)
WBC: 9.7 K/uL (ref 4.0–10.5)
nRBC: 0 % (ref 0.0–0.2)

## 2024-05-15 LAB — BASIC METABOLIC PANEL WITH GFR
Anion gap: 11 (ref 5–15)
BUN: 12 mg/dL (ref 8–23)
CO2: 22 mmol/L (ref 22–32)
Calcium: 9.3 mg/dL (ref 8.9–10.3)
Chloride: 104 mmol/L (ref 98–111)
Creatinine, Ser: 0.67 mg/dL (ref 0.44–1.00)
GFR, Estimated: >60 mL/min (ref >60)
Glucose, Bld: 131 mg/dL — ABNORMAL HIGH (ref 70–99)
Potassium: 3.5 mmol/L (ref 3.5–5.1)
Sodium: 137 mmol/L (ref 135–145)

## 2024-05-15 NOTE — Progress Notes (Addendum)
      [address] [place] [address]             [phone]      1 Day Post-Op Procedure(s) (LRB): LOBECTOMY, LUNG, ROBOT-ASSISTED, USING VATS (Right) BLOCK, NERVE, INTERCOSTAL (Right) LYMPH NODE BIOPSY (Right) Subjective: Patient without complaints this AM  Objective: Vital signs in last 24 hours: Temp:  [97.6 F (36.4 C)-98.9 F (37.2 C)] 98.9 F (37.2 C) (11/21 0452) Pulse Rate:  [58-93] 59 (11/21 0452) Cardiac Rhythm: Normal sinus rhythm (11/21 0700) Resp:  [13-19] 17 (11/21 0452) BP: (111-169)/(49-105) 152/60 (11/21 0452) SpO2:  [95 %-100 %] 96 % (11/21 0452) Arterial Line BP: (187)/(77) 187/77 (11/20 1215)  Hemodynamic parameters for last 24 hours:    Intake/Output from previous day: 11/20 0701 - 11/21 0700 In: 2817 [P.O.:240; I.V.:2027; IV Piggyback:550] Out: 1680 [Urine:1300; Blood:100; Chest Tube:280] Intake/Output this shift: No intake/output data recorded.  General appearance: alert, cooperative, and no distress Neurologic: intact Heart: regular rate and rhythm, S1, S2 normal, no murmur, click, rub or gallop Lungs: diminished right apical breath sounds Abdomen: soft, non-tender; bowel sounds normal; no masses,  no organomegaly Extremities: extremities normal, atraumatic, no cyanosis or edema Wound: Clean and dry without sign of infection  Lab Results: Recent Labs    05/12/24 1345 05/15/24 0219  WBC 5.3 9.7  HGB 12.0 9.9*  HCT 36.7 30.4*  PLT 245 187   BMET:  Recent Labs    05/12/24 1345 05/15/24 0219  NA 140 137  K 3.2* 3.5  CL 102 104  CO2 29 22  GLUCOSE 115* 131*  BUN 10 12  CREATININE 0.69 0.67  CALCIUM  9.3 9.3    PT/INR:  Recent Labs    05/12/24 1345  LABPROT 13.9  INR 1.0   ABG    Component Value Date/Time   PHART 7.416 04/10/2019 0617   HCO3 27.1 04/10/2019 0617   TCO2 28 04/10/2019 0617   O2SAT 99.2 04/10/2019 0643   CBG (last 3)  No results for input(s): GLUCAP in the last 72  hours.  Assessment/Plan: S/P Procedure(s) (LRB): LOBECTOMY, LUNG, ROBOT-ASSISTED, USING VATS (Right) BLOCK, NERVE, INTERCOSTAL (Right) LYMPH NODE BIOPSY (Right)  Neuro: Pain controlled, continue current pain regimen  CV: NSR, HR 60s. Some HTN, SBP 130s-150s. Does not take home meds, likely due to pain.  Pulm: Saturating well on RA this AM. CT output 280cc/24hrs. CT to water seal. No air leak with cough. CXR with stable right apical space. Encourage IS and ambulation.   GI: Tolerating a diet, passing gas  Renal: Cr 0.67, stable. Good UO 1300cc/24hrs. D/C foley.   Expected postop ABLA: H/H 9.9/30.4, not clinically significant at this time.   DVT Prophylaxis: Lovenox   Dispo: Ambulate today. Can likely d/c chest tube this AM and hopefully can d/c home tomorrow AM.    LOS: 1 day    Con GORMAN Bend, PA-C 05/15/2024  Patient seen and examined, agree with above No air leak but some tidal movement, poor cough effort- will clamp tube and check CXR in a couple of hours to be sure no issues before removing tube  Elspeth C. Kerrin, MD [facility] [phone]

## 2024-05-15 NOTE — Progress Notes (Signed)
      [address] [place] [address]             [phone]      Patient's CXR looks stable after clamping trial but air leak was seen without cough after clamp was removed. As discussed with Dr. Kerrin will leave chest tube in place to water seal today. Will get CXR in the morning.   Con GORMAN Bend, PA-C 05/15/24

## 2024-05-15 NOTE — Progress Notes (Signed)
 Mobility Specialist Progress Note:    05/15/24 1140  Therapy Vitals  Temp 97.8 F (36.6 C)  Temp Source Oral  Pulse Rate 60  Resp 18  BP (!) 177/75  Patient Position (if appropriate) Lying  Oxygen Therapy  SpO2 92 %  O2 Device Room Air  Mobility  Activity Ambulated with assistance  Level of Assistance Contact guard assist, steadying assist  Assistive Device None  Distance Ambulated (ft) 150 ft  Activity Response Tolerated well  Mobility Referral Yes  Mobility visit 1 Mobility  Mobility Specialist Start Time (ACUTE ONLY) 1050  Mobility Specialist Stop Time (ACUTE ONLY) 1104  Mobility Specialist Time Calculation (min) (ACUTE ONLY) 14 min   Pt received in bed agreeable to mobility. No physical assistance needed, contact guard for safety. VSS throughout. Returned to room w/o fault. Left in bed w/ call bell and personal belongings in reach. All needs met.   Thersia Minder Mobility Specialist  Please contact vis Secure Chat or  Rehab Office [phone]

## 2024-05-15 NOTE — Anesthesia Postprocedure Evaluation (Signed)
 Anesthesia Post Note  Patient: Erminio A Desaulniers  Procedure(s) Performed: LOBECTOMY, LUNG, ROBOT-ASSISTED, USING VATS (Right: Chest) BLOCK, NERVE, INTERCOSTAL (Right: Chest) LYMPH NODE BIOPSY (Right: Chest)     Patient location during evaluation: PACU Anesthesia Type: General Level of consciousness: awake and patient cooperative Pain management: pain level controlled Vital Signs Assessment: post-procedure vital signs reviewed and stable Respiratory status: spontaneous breathing, nonlabored ventilation and respiratory function stable Cardiovascular status: blood pressure returned to baseline and stable Postop Assessment: no apparent nausea or vomiting Anesthetic complications: no   No notable events documented.                  Devetta Hagenow

## 2024-05-15 NOTE — TOC CM/SW Note (Signed)
 Transition of Care [facility]) - Inpatient Brief Assessment   Patient Details  Name: [name] MRN: [id number] Date of Birth: 15-May-1946  [facility]) CM/SW Contact:    Lauraine FORBES Saa, LCSWA Phone Number: 05/15/2024, 9:14 AM   Clinical Narrative:  9:14 AM Per chart review, patient resides at home alone. Patient has a PCP and insurance. Patient does not have SNF/HH/DME history. Patient's preferred pharmacy is [facility] [phone] [facility] No TOC needs identified at this time. TOC will continue to follow.  Transition of Care Asessment: Insurance and Status: Insurance coverage has been reviewed Patient has primary care physician: Yes Home environment has been reviewed: Private Residence Prior level of function:: N/A Prior/Current Home Services: No current home services Social Drivers of Health Review: SDOH reviewed no interventions necessary Readmission risk has been reviewed: Yes (Currently Green 10%) Transition of care needs: no transition of care needs at this time

## 2024-05-16 ENCOUNTER — Inpatient Hospital Stay (HOSPITAL_COMMUNITY)

## 2024-05-16 DIAGNOSIS — Z4682 Encounter for fitting and adjustment of non-vascular catheter: Secondary | ICD-10-CM | POA: Diagnosis not present

## 2024-05-16 DIAGNOSIS — J939 Pneumothorax, unspecified: Secondary | ICD-10-CM | POA: Diagnosis not present

## 2024-05-16 DIAGNOSIS — Z902 Acquired absence of lung [part of]: Secondary | ICD-10-CM | POA: Diagnosis not present

## 2024-05-16 LAB — COMPREHENSIVE METABOLIC PANEL WITH GFR
ALT: 16 U/L (ref 0–44)
AST: 31 U/L (ref 15–41)
Albumin: 3.2 g/dL — ABNORMAL LOW (ref 3.5–5.0)
Alkaline Phosphatase: 58 U/L (ref 38–126)
Anion gap: 5 (ref 5–15)
BUN: 9 mg/dL (ref 8–23)
CO2: 29 mmol/L (ref 22–32)
Calcium: 9 mg/dL (ref 8.9–10.3)
Chloride: 103 mmol/L (ref 98–111)
Creatinine, Ser: 0.7 mg/dL (ref 0.44–1.00)
GFR, Estimated: >60 mL/min (ref >60)
Glucose, Bld: 93 mg/dL (ref 70–99)
Potassium: 3.3 mmol/L — ABNORMAL LOW (ref 3.5–5.1)
Sodium: 137 mmol/L (ref 135–145)
Total Bilirubin: 0.6 mg/dL (ref 0.0–1.2)
Total Protein: 5.9 g/dL — ABNORMAL LOW (ref 6.5–8.1)

## 2024-05-16 LAB — CBC
HCT: 30.2 % — ABNORMAL LOW (ref 36.0–46.0)
Hemoglobin: 9.9 g/dL — ABNORMAL LOW (ref 12.0–15.0)
MCH: 29.6 pg (ref 26.0–34.0)
MCHC: 32.8 g/dL (ref 30.0–36.0)
MCV: 90.1 fL (ref 80.0–100.0)
Platelets: 186 K/uL (ref 150–400)
RBC: 3.35 MIL/uL — ABNORMAL LOW (ref 3.87–5.11)
RDW: 12.9 % (ref 11.5–15.5)
WBC: 8.3 K/uL (ref 4.0–10.5)
nRBC: 0 % (ref 0.0–0.2)

## 2024-05-16 MED ORDER — POTASSIUM CHLORIDE CRYS ER 20 MEQ PO TBCR
40.0000 meq | EXTENDED_RELEASE_TABLET | Freq: Two times a day (BID) | ORAL | Status: AC
  Administered 2024-05-16 (×2): 40 meq via ORAL
  Filled 2024-05-16 (×2): qty 2

## 2024-05-16 MED ORDER — LOSARTAN POTASSIUM 25 MG PO TABS
25.0000 mg | ORAL_TABLET | Freq: Every day | ORAL | Status: DC
  Administered 2024-05-16: 25 mg via ORAL
  Filled 2024-05-16: qty 1

## 2024-05-16 NOTE — Progress Notes (Signed)
 Mobility Specialist Progress Note;   05/16/24 1100  Mobility  Activity Ambulated with assistance  Level of Assistance Contact guard assist, steadying assist  Assistive Device None  Distance Ambulated (ft) 400 ft  Activity Response Tolerated well  Mobility Referral Yes  Mobility visit 1 Mobility  Mobility Specialist Start Time (ACUTE ONLY) 1100  Mobility Specialist Stop Time (ACUTE ONLY) 1118  Mobility Specialist Time Calculation (min) (ACUTE ONLY) 18 min   Pt agreeable to mobility. Required light MinG assistance during ambulation for safety. Took 1x standing rest break d/t fatigue. VSS throughout. Pt returned back to bed and left with all needs met, call bell in reach. Family present.   Lauraine Erm Mobility Specialist Please contact via SecureChat or [employer] [phone]

## 2024-05-16 NOTE — Progress Notes (Addendum)
 2 Days Post-Op Procedure(s) (LRB): LOBECTOMY, LUNG, ROBOT-ASSISTED, USING VATS (Right) BLOCK, NERVE, INTERCOSTAL (Right) LYMPH NODE BIOPSY (Right) Subjective: Weak but pain pretty well controlled  Objective: Vital signs in last 24 hours: Temp:  [97.8 F (36.6 C)-98 F (36.7 C)] 98 F (36.7 C) (11/22 0812) Pulse Rate:  [60-66] 61 (11/22 0812) Cardiac Rhythm: Normal sinus rhythm (11/22 0734) Resp:  [16-20] 16 (11/22 0812) BP: (151-177)/(62-75) 159/65 (11/22 0812) SpO2:  [92 %-94 %] 93 % (11/22 0812)  Hemodynamic parameters for last 24 hours:    Intake/Output from previous day: 11/21 0701 - 11/22 0700 In: 240 [P.O.:240] Out: 246 [Chest Tube:246] Intake/Output this shift: No intake/output data recorded.  General appearance: alert, cooperative, and no distress Heart: regular rate and rhythm and occas extrasystoles Lungs: slightly coarse throughout Abdomen: benign Extremities: no edema or calf tenderness Wound: incis healing well  Lab Results: Recent Labs    05/15/24 0219 05/16/24 0248  WBC 9.7 8.3  HGB 9.9* 9.9*  HCT 30.4* 30.2*  PLT 187 186   BMET:  Recent Labs    05/15/24 0219 05/16/24 0248  NA 137 137  K 3.5 3.3*  CL 104 103  CO2 22 29  GLUCOSE 131* 93  BUN 12 9  CREATININE 0.67 0.70  CALCIUM  9.3 9.0    PT/INR: No results for input(s): LABPROT, INR in the last 72 hours. ABG    Component Value Date/Time   PHART 7.416 04/10/2019 0617   HCO3 27.1 04/10/2019 0617   TCO2 28 04/10/2019 0617   O2SAT 99.2 04/10/2019 0643   CBG (last 3)  No results for input(s): GLUCAP in the last 72 hours.  Meds Scheduled Meds:  acetaminophen   1,000 mg Oral Q6H   Or   acetaminophen  (TYLENOL ) oral liquid 160 mg/5 mL  1,000 mg Oral Q6H   bisacodyl   10 mg Oral Daily   enoxaparin  (LOVENOX ) injection  40 mg Subcutaneous Daily   gabapentin   300 mg Oral QHS   Followed by   NOREEN ON 05/17/2024] gabapentin   300 mg Oral BID   pantoprazole   40 mg Oral Daily    senna-docusate  1 tablet Oral QHS   Continuous Infusions: PRN Meds:.morphine  injection, ondansetron  (ZOFRAN ) IV, oxyCODONE , traMADol   Xrays DG Chest Port 1 View Result Date: 05/16/2024 EXAM: 1 VIEW(S) XRAY OF THE CHEST 05/16/2024 07:10:00 AM COMPARISON: 05/15/2024 CLINICAL HISTORY: Pneumothorax, right FINDINGS: LINES, TUBES AND DEVICES: Stable right chest tube. LUNGS AND PLEURA: Postsurgical changes from right upper lobectomy. Stable right apical pneumothorax. No pleural effusion. HEART AND MEDIASTINUM: No acute abnormality of the cardiac and mediastinal silhouettes. BONES AND SOFT TISSUES: No acute osseous abnormality. IMPRESSION: 1. Stable right apical pneumothorax with right chest tube in place. 2. Postsurgical changes from right upper lobectomy. Electronically signed by: Waddell Calk MD 05/16/2024 07:32 AM EST RP Workstation: [id number]   DG Chest 1V REPEAT Same Day Result Date: 05/15/2024 EXAM: 1 VIEW(S) XRAY OF THE CHEST 05/15/2024 12:00:00 PM COMPARISON: 05/15/2024 CLINICAL HISTORY: Status post lobectomy of lung. FINDINGS: LINES, TUBES AND DEVICES: Stable right chest tube. LUNGS AND PLEURA: Stable right apical pneumothorax. Postsurgical changes notedwithin the right lung. No focal pulmonary opacity. No pleural effusion. HEART AND MEDIASTINUM: No acute abnormality of the cardiac and mediastinal silhouettes. BONES AND SOFT TISSUES: No acute osseous abnormality. IMPRESSION: 1. Stable right apical pneumothorax with right chest tube in place. 2. Postsurgical changes from right upper lobectomy. Electronically signed by: Waddell Calk MD 05/15/2024 03:07 PM EST RP Workstation: [id number]   DG Chest Port  1 View Result Date: 05/15/2024 EXAM: 1 VIEW(S) XRAY OF THE CHEST 05/15/2024 06:19:00 AM COMPARISON: Comparison yesterday. CLINICAL HISTORY: S/P lobectomy of lung. FINDINGS: LINES, TUBES AND DEVICES: Stable right-sided chest tube. LUNGS AND PLEURA: No focal pulmonary opacity. No pleural effusion.  Stable right apical pneumothorax. HEART AND MEDIASTINUM: Stable cardiomediastinal silhouette. BONES AND SOFT TISSUES: No acute osseous abnormality. IMPRESSION: 1. Stable right apical pneumothorax with unchanged right-sided chest tube positioning. Electronically signed by: Lynwood Seip MD 05/15/2024 08:40 AM EST RP Workstation: [id number]   DG Chest Port 1 View Result Date: 05/14/2024 EXAM: 1 VIEW(S) XRAY OF THE CHEST 05/14/2024 12:12:00 PM COMPARISON: 05/12/2024 CLINICAL HISTORY: S/P lobectomy of lung. FINDINGS: LINES, TUBES AND DEVICES: Right chest tube in place. LUNGS AND PLEURA: Postoperative changes from right upper lobectomy noted. Right upper lung surgical staple line noted. Resection cavity over the right upper lobe measures 6.8 x 4.1 cm containing air. Mild subsegmental atelectasis at left lung base. Redemonstrated left mid lung nodule. No pleural effusion. No pneumothorax. HEART AND MEDIASTINUM: No acute abnormality of the cardiac and mediastinal silhouettes. BONES AND SOFT TISSUES: No acute osseous abnormality. IMPRESSION: 1. Postoperative changes from right upper lobectomy with resection cavity containing air and right chest tube in place. Electronically signed by: Waddell Calk MD 05/14/2024 01:11 PM EST RP Workstation: [id number]    Assessment/Plan: S/P Procedure(s) (LRB): LOBECTOMY, LUNG, ROBOT-ASSISTED, USING VATS (Right) BLOCK, NERVE, INTERCOSTAL (Right) LYMPH NODE BIOPSY (Right) POD#2  1 afeb, sBP 150's-170's, sinus rhythm- on no BP meds at home, NSR, + PVC's- will start on ARB 2 O2 sats good on RA 3 CT 246 ml /24h - no air leak, will remove today 4 voiding- unmeasured 5 CXR is stable w/ right sided moderate pntx/space 6 K+ 3.3 replace 7 normal renal fxn 8 expected ABLA- stable 9 lovenox  for DVT ppx 10 pulm hygiene and rehab modalities- poss home in am    LOS: 2 days    Lemond FORBES Cera PA-C Pager [phone] 05/16/2024   Chart reviewed, patient examined, agree with  above.  She feels much better with tube out. Will check CXR in am.

## 2024-05-17 ENCOUNTER — Inpatient Hospital Stay (HOSPITAL_COMMUNITY)

## 2024-05-17 LAB — BASIC METABOLIC PANEL WITH GFR
Anion gap: 9 (ref 5–15)
BUN: 10 mg/dL (ref 8–23)
CO2: 26 mmol/L (ref 22–32)
Calcium: 9.1 mg/dL (ref 8.9–10.3)
Chloride: 101 mmol/L (ref 98–111)
Creatinine, Ser: 0.55 mg/dL (ref 0.44–1.00)
GFR, Estimated: >60 mL/min (ref >60)
Glucose, Bld: 68 mg/dL — ABNORMAL LOW (ref 70–99)
Potassium: 4.3 mmol/L (ref 3.5–5.1)
Sodium: 136 mmol/L (ref 135–145)

## 2024-05-17 LAB — MAGNESIUM: Magnesium: 1.9 mg/dL (ref 1.7–2.4)

## 2024-05-17 MED ORDER — LOSARTAN POTASSIUM 25 MG PO TABS
50.0000 mg | ORAL_TABLET | Freq: Every day | ORAL | Status: DC
  Administered 2024-05-17 – 2024-05-20 (×4): 50 mg via ORAL
  Filled 2024-05-17 (×4): qty 2

## 2024-05-17 MED ORDER — METOPROLOL TARTRATE 25 MG PO TABS
25.0000 mg | ORAL_TABLET | Freq: Two times a day (BID) | ORAL | Status: DC
  Administered 2024-05-17 – 2024-05-19 (×6): 25 mg via ORAL
  Filled 2024-05-17 (×6): qty 1

## 2024-05-17 NOTE — Progress Notes (Addendum)
 3 Days Post-Op Procedure(s) (LRB): LOBECTOMY, LUNG, ROBOT-ASSISTED, USING VATS (Right) BLOCK, NERVE, INTERCOSTAL (Right) LYMPH NODE BIOPSY (Right) Subjective: Feels better, c/o loose stools and poor appetite  Objective: Vital signs in last 24 hours: Temp:  [97.8 F (36.6 C)-98.4 F (36.9 C)] 97.8 F (36.6 C) (11/23 0356) Pulse Rate:  [61-74] 74 (11/23 0356) Cardiac Rhythm: Normal sinus rhythm (11/22 2000) Resp:  [16-19] 18 (11/23 0356) BP: (158-179)/(65-72) 179/71 (11/23 0028) SpO2:  [91 %-94 %] 93 % (11/23 0356)  Hemodynamic parameters for last 24 hours:    Intake/Output from previous day: 11/22 0701 - 11/23 0700 In: 360 [P.O.:360] Out: 60 [Chest Tube:60] Intake/Output this shift: No intake/output data recorded.  General appearance: alert, cooperative, and no distress Heart: regular rate and rhythm Lungs: clear to auscultation bilaterally Abdomen: benign Extremities: no edema or calf tenderness Wound: incis healing well  Lab Results: Recent Labs    05/15/24 0219 05/16/24 0248  WBC 9.7 8.3  HGB 9.9* 9.9*  HCT 30.4* 30.2*  PLT 187 186   BMET:  Recent Labs    05/16/24 0248 05/17/24 0200  NA 137 136  K 3.3* 4.3  CL 103 101  CO2 29 26  GLUCOSE 93 68*  BUN 9 10  CREATININE 0.70 0.55  CALCIUM  9.0 9.1    PT/INR: No results for input(s): LABPROT, INR in the last 72 hours. ABG    Component Value Date/Time   PHART 7.416 04/10/2019 0617   HCO3 27.1 04/10/2019 0617   TCO2 28 04/10/2019 0617   O2SAT 99.2 04/10/2019 0643   CBG (last 3)  No results for input(s): GLUCAP in the last 72 hours.  Meds Scheduled Meds:  acetaminophen   1,000 mg Oral Q6H   Or   acetaminophen  (TYLENOL ) oral liquid 160 mg/5 mL  1,000 mg Oral Q6H   bisacodyl   10 mg Oral Daily   enoxaparin  (LOVENOX ) injection  40 mg Subcutaneous Daily   gabapentin   300 mg Oral BID   losartan   25 mg Oral Daily   pantoprazole   40 mg Oral Daily   senna-docusate  1 tablet Oral QHS    Continuous Infusions: PRN Meds:.morphine  injection, ondansetron  (ZOFRAN ) IV, oxyCODONE , traMADol   Xrays DG Chest Port 1 View Result Date: 05/16/2024 EXAM: 1 VIEW(S) XRAY OF THE CHEST 05/16/2024 07:10:00 AM COMPARISON: 05/15/2024 CLINICAL HISTORY: Pneumothorax, right FINDINGS: LINES, TUBES AND DEVICES: Stable right chest tube. LUNGS AND PLEURA: Postsurgical changes from right upper lobectomy. Stable right apical pneumothorax. No pleural effusion. HEART AND MEDIASTINUM: No acute abnormality of the cardiac and mediastinal silhouettes. BONES AND SOFT TISSUES: No acute osseous abnormality. IMPRESSION: 1. Stable right apical pneumothorax with right chest tube in place. 2. Postsurgical changes from right upper lobectomy. Electronically signed by: Waddell Calk MD 05/16/2024 07:32 AM EST RP Workstation: [id number]   DG Chest 1V REPEAT Same Day Result Date: 05/15/2024 EXAM: 1 VIEW(S) XRAY OF THE CHEST 05/15/2024 12:00:00 PM COMPARISON: 05/15/2024 CLINICAL HISTORY: Status post lobectomy of lung. FINDINGS: LINES, TUBES AND DEVICES: Stable right chest tube. LUNGS AND PLEURA: Stable right apical pneumothorax. Postsurgical changes notedwithin the right lung. No focal pulmonary opacity. No pleural effusion. HEART AND MEDIASTINUM: No acute abnormality of the cardiac and mediastinal silhouettes. BONES AND SOFT TISSUES: No acute osseous abnormality. IMPRESSION: 1. Stable right apical pneumothorax with right chest tube in place. 2. Postsurgical changes from right upper lobectomy. Electronically signed by: Waddell Calk MD 05/15/2024 03:07 PM EST RP Workstation: [id number]    Assessment/Plan: S/P Procedure(s) (LRB): LOBECTOMY, LUNG, ROBOT-ASSISTED, USING VATS (  Right) BLOCK, NERVE, INTERCOSTAL (Right) LYMPH NODE BIOPSY (Right)  1 afeb, S BP remains elevated, started on ARB yesterday, will increase dose, likeky has had HTN for a while on no meds- will need close f/u w/ PMD, having episodes of SVT- will add beta  blocker 2 O2 sats good on RA 3 voiding- amts not recorded, + stool 4 CXR remains stable w/ mod size space following bi-lobectomy 5 K= now in normal range, renal fxn remains normal 6 poor appetite- push as able 7 loose stools, d/c laxatives and stool softeners 8 cont pulm hygiene and rehab 9 lovenox  for DVT ppx     LOS: 3 days    Lemond FORBES Cera PA-C Pager [phone] 05/17/2024   Chart reviewed, patient examined, agree with above.  She is doing well overall.  CXR shows stable right apical space.  Continue ambulation today and plan home tomorrow if she progresses.

## 2024-05-17 NOTE — Plan of Care (Signed)
  Problem: Education: Goal: Knowledge of General Education information will improve Description: Including pain rating scale, medication(s)/side effects and non-pharmacologic comfort measures Outcome: Progressing   Problem: Health Behavior/Discharge Planning: Goal: Ability to manage health-related needs will improve Outcome: Progressing   Problem: Clinical Measurements: Goal: Ability to maintain clinical measurements within normal limits will improve Outcome: Progressing Goal: Will remain free from infection Outcome: Progressing Goal: Diagnostic test results will improve Outcome: Progressing Goal: Cardiovascular complication will be avoided Outcome: Progressing   Problem: Nutrition: Goal: Adequate nutrition will be maintained Outcome: Progressing   Problem: Pain Managment: Goal: General experience of comfort will improve and/or be controlled Outcome: Progressing   Problem: Education: Goal: Knowledge of disease or condition will improve Outcome: Progressing   Problem: Respiratory: Goal: Respiratory status will improve Outcome: Progressing   Problem: Pain Management: Goal: Pain level will decrease Outcome: Progressing   Problem: Skin Integrity: Goal: Wound healing without signs and symptoms infection will improve Outcome: Progressing

## 2024-05-18 LAB — CBC
HCT: 30.8 % — ABNORMAL LOW (ref 36.0–46.0)
Hemoglobin: 10.1 g/dL — ABNORMAL LOW (ref 12.0–15.0)
MCH: 29.6 pg (ref 26.0–34.0)
MCHC: 32.8 g/dL (ref 30.0–36.0)
MCV: 90.3 fL (ref 80.0–100.0)
Platelets: 211 K/uL (ref 150–400)
RBC: 3.41 MIL/uL — ABNORMAL LOW (ref 3.87–5.11)
RDW: 12.6 % (ref 11.5–15.5)
WBC: 6.8 K/uL (ref 4.0–10.5)
nRBC: 0 % (ref 0.0–0.2)

## 2024-05-18 LAB — BASIC METABOLIC PANEL WITH GFR
Anion gap: 7 (ref 5–15)
BUN: 12 mg/dL (ref 8–23)
CO2: 30 mmol/L (ref 22–32)
Calcium: 9.2 mg/dL (ref 8.9–10.3)
Chloride: 98 mmol/L (ref 98–111)
Creatinine, Ser: 0.58 mg/dL (ref 0.44–1.00)
GFR, Estimated: >60 mL/min (ref >60)
Glucose, Bld: 68 mg/dL — ABNORMAL LOW (ref 70–99)
Potassium: 3.6 mmol/L (ref 3.5–5.1)
Sodium: 135 mmol/L (ref 135–145)

## 2024-05-18 MED ORDER — POTASSIUM CHLORIDE CRYS ER 20 MEQ PO TBCR
40.0000 meq | EXTENDED_RELEASE_TABLET | Freq: Once | ORAL | Status: AC
  Administered 2024-05-18: 40 meq via ORAL
  Filled 2024-05-18: qty 2

## 2024-05-18 NOTE — Progress Notes (Signed)
 Mobility Specialist Progress Note;   05/18/24 0910  Mobility  Activity Ambulated with assistance  Level of Assistance Contact guard assist, steadying assist  Assistive Device Other (Comment) (HHA)  Distance Ambulated (ft) 500 ft  Activity Response Tolerated fair  Mobility Referral Yes  Mobility visit 1 Mobility  Mobility Specialist Start Time (ACUTE ONLY) 0910  Mobility Specialist Stop Time (ACUTE ONLY) [id number]  Mobility Specialist Time Calculation (min) (ACUTE ONLY) 13 min   Pt agreeable to mobility. Initially required SV during ambulation, however experienced a couple LOBs requiring MinG assist to correct. Pt c/o dizziness throughout, BP 129/64 (82) when checked. Pt returned back to room and left with all needs met. RN notified.   Lauraine Erm Mobility Specialist Please contact via SecureChat or [employer] [phone]

## 2024-05-18 NOTE — TOC Initial Note (Signed)
 Transition of Care [facility]) - Initial/Assessment Note    Patient Details  Name: [name] MRN: [id number] Date of Birth: August 11, 1945  [facility]) CM/SW Contact:    Roxie KANDICE Stain, RN Phone Number: 05/18/2024, 4:54 PM  Clinical Narrative:                  Spoke to patient regarding transition needs.  Patient lives alone and has family support.  Patient agreeable to home health and DME. Notified Zack with [employer] of DME orders. This RNCM offered choice for Home Health, [name] states she has no preference, RNCM made referral to [facility] with Hedda, He is able to take referral.  Address, Phone number and PCP verified. Expected Discharge Plan: Home w Home Health Services Barriers to Discharge: Continued Medical Work up   Patient Goals and CMS Choice Patient states their goals for this hospitalization and ongoing recovery are:: return home CMS Medicare.gov Compare Post Acute Care list provided to:: Patient Choice offered to / list presented to : Patient      Expected Discharge Plan and Services   Discharge Planning Services: CM Consult Post Acute Care Choice: Home Health, Durable Medical Equipment Living arrangements for the past 2 months: Single Family Home                 DME Arranged: Walker rolling, Bedside commode DME Agency: [employer] Date DME Agency Contacted: 05/18/24 Time DME Agency Contacted: [phone] Representative spoke with at DME Agency: Zack HH Arranged: PT, OT HH Agency: [facility] Date [facility] Agency Contacted: 05/18/24 Time HH Agency Contacted: [phone] Representative spoke with at [facility] Agency: Darleene  Prior Living Arrangements/Services Living arrangements for the past 2 months: Single Family Home Lives with:: Self Patient language and need for interpreter reviewed:: Yes Do you feel safe going back to the place where you live?: Yes      Need for Family Participation in Patient Care: Yes (Comment) Care giver support system in  place?: Yes (comment)   Criminal Activity/Legal Involvement Pertinent to Current Situation/Hospitalization: No - Comment as needed  Activities of Daily Living   ADL Screening (condition at time of admission) Independently performs ADLs?: Yes (appropriate for developmental age) Is the patient deaf or have difficulty hearing?: No Does the patient have difficulty seeing, even when wearing glasses/contacts?: No Does the patient have difficulty concentrating, remembering, or making decisions?: No  Permission Sought/Granted Permission sought to share information with : Photographer granted to share info w AGENCY: home health, DME        Emotional Assessment   Attitude/Demeanor/Rapport: Gracious, Engaged Affect (typically observed): Accepting Orientation: : Oriented to Situation, Oriented to  Time, Oriented to Place, Oriented to Self Alcohol / Substance Use: Not Applicable    Admission diagnosis:  Mass of upper lobe of right lung [R91.8] Mass of middle lobe of right lung [R91.8] 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] S/P lobectomy of lung [Z90.2] Patient Active Problem List   Diagnosis Date Noted   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 [place] [address] Phone: [phone] Fax: [phone]     Social Drivers of Health (SDOH) Social History: SDOH  Screenings   Food Insecurity: No Food Insecurity (05/14/2024)  Housing: Low Risk  (05/14/2024)  Transportation Needs: No Transportation Needs (05/14/2024)  Utilities: Not At Risk (05/14/2024)  Alcohol Screen: Low Risk  (08/07/2023)  Depression (PHQ2-9): Low Risk  (04/28/2024)  Financial Resource Strain: Low Risk  (09/08/2023)  Recent Concern: Financial Resource Strain - Medium Risk (08/07/2023)  Physical Activity: Sufficiently Active (09/08/2023)  Recent Concern: Physical Activity - Insufficiently Active (08/07/2023)  Social Connections: Moderately Isolated (05/15/2024)  Stress: No Stress Concern Present (09/08/2023)  Tobacco Use: High Risk (05/14/2024)  Health Literacy: Adequate Health Literacy (08/07/2023)   SDOH Interventions:     Readmission Risk Interventions     No data to display

## 2024-05-18 NOTE — Progress Notes (Addendum)
      [address] [employer] [address]             [phone]      4 Days Post-Op Procedure(s) (LRB): LOBECTOMY, LUNG, ROBOT-ASSISTED, USING VATS (Right) BLOCK, NERVE, INTERCOSTAL (Right) LYMPH NODE BIOPSY (Right) Subjective: Patient reports she had some right sided back pain when she was walking in her room yesterday  Objective: Vital signs in last 24 hours: Temp:  [97.8 F (36.6 C)-98.8 F (37.1 C)] 97.8 F (36.6 C) (11/24 0715) Pulse Rate:  [51-78] 69 (11/24 0715) Cardiac Rhythm: Normal sinus rhythm (11/24 0700) Resp:  [14-20] 19 (11/24 0715) BP: (126-176)/(58-96) 143/58 (11/24 0715) SpO2:  [94 %-96 %] 95 % (11/24 0715)  Hemodynamic parameters for last 24 hours:    Intake/Output from previous day: No intake/output data recorded. Intake/Output this shift: No intake/output data recorded.  General appearance: alert, cooperative, and no distress Neurologic: intact Heart: regular rate and rhythm, S1, S2 normal, no murmur, click, rub or gallop Lungs: diminished right apical breath sounds Abdomen: soft, non-tender; bowel sounds normal; no masses,  no organomegaly Extremities: extremities normal, atraumatic, no cyanosis or edema Wound: Clean and dry without sign of infection  Lab Results: Recent Labs    05/16/24 0248 05/18/24 0154  WBC 8.3 6.8  HGB 9.9* 10.1*  HCT 30.2* 30.8*  PLT 186 211   BMET:  Recent Labs    05/17/24 0200 05/18/24 0154  NA 136 135  K 4.3 3.6  CL 101 98  CO2 26 30  GLUCOSE 68* 68*  BUN 10 12  CREATININE 0.55 0.58  CALCIUM  9.1 9.2    PT/INR: No results for input(s): LABPROT, INR in the last 72 hours. ABG    Component Value Date/Time   PHART 7.416 04/10/2019 0617   HCO3 27.1 04/10/2019 0617   TCO2 28 04/10/2019 0617   O2SAT 99.2 04/10/2019 0643   CBG (last 3)  No results for input(s): GLUCAP in the last 72 hours.  Assessment/Plan: S/P Procedure(s) (LRB): LOBECTOMY, LUNG, ROBOT-ASSISTED, USING  VATS (Right) BLOCK, NERVE, INTERCOSTAL (Right) LYMPH NODE BIOPSY (Right)  Neuro: pain overall controlled, continue current pain regimen  CV: Some HTN, patient reports it was elevated at times prior to admission and episodes of SVT, started on Cozaar  50mg  daily and Lopressor  25mg  BID. NSR, HR 70s this AM no further SVT on tele.   Pulm: Saturating well on RA. CXR yesterday with stable moderate sized apical space from bilobectomy. Encourage IS and ambulation  GI: Tolerating a diet, no further loose BMs  Renal: Cr 0.58, stable. K 3.6, supplement.   DVT Prophylaxis: Lovenox   Dispo: Patient walked in her room yesterday but did not walk in the hall and reports the last time she walked in the hall she had dizziness. Walk in the hall this AM and if tolerates well plan to d/c home today.  Addendum: Patient continued to have some dizziness and loss of balance at times during walk this AM. Ordered PT/OT consults, dizziness improved throughout the day especially while walking with walker. Will keep today to work on ambulation and plan to d/c tomorrow with home health.    LOS: 4 days    Con GORMAN Bend, PA-C 05/18/2024

## 2024-05-18 NOTE — Plan of Care (Signed)
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
## 2024-05-18 NOTE — Care Management Important Message (Signed)
 Important Message  Patient Details  Name: [name] MRN: [id number] Date of Birth: 01-24-1946   Important Message Given:  Yes - Medicare IM     [name] 05/18/2024, 12:08 PM
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
## 2024-05-19 ENCOUNTER — Inpatient Hospital Stay (HOSPITAL_COMMUNITY)

## 2024-05-19 DIAGNOSIS — C189 Malignant neoplasm of colon, unspecified: Secondary | ICD-10-CM

## 2024-05-19 DIAGNOSIS — C7800 Secondary malignant neoplasm of unspecified lung: Secondary | ICD-10-CM

## 2024-05-19 DIAGNOSIS — Z902 Acquired absence of lung [part of]: Secondary | ICD-10-CM | POA: Diagnosis not present

## 2024-05-19 DIAGNOSIS — I4891 Unspecified atrial fibrillation: Secondary | ICD-10-CM

## 2024-05-19 DIAGNOSIS — I9789 Other postprocedural complications and disorders of the circulatory system, not elsewhere classified: Secondary | ICD-10-CM

## 2024-05-19 LAB — BASIC METABOLIC PANEL WITH GFR
Anion gap: 14 (ref 5–15)
BUN: 12 mg/dL (ref 8–23)
CO2: 23 mmol/L (ref 22–32)
Calcium: 9.1 mg/dL (ref 8.9–10.3)
Chloride: 98 mmol/L (ref 98–111)
Creatinine, Ser: 0.68 mg/dL (ref 0.44–1.00)
GFR, Estimated: >60 mL/min (ref >60)
Glucose, Bld: 183 mg/dL — ABNORMAL HIGH (ref 70–99)
Potassium: 3.7 mmol/L (ref 3.5–5.1)
Sodium: 135 mmol/L (ref 135–145)

## 2024-05-19 LAB — ECHOCARDIOGRAM COMPLETE
Area-P 1/2: 2.48 cm2
Height: 65 in
S' Lateral: 3.2 cm
Single Plane A4C EF: 50.2 %
Weight: 2128 [oz_av]

## 2024-05-19 LAB — TSH: TSH: 1.961 u[IU]/mL (ref 0.350–4.500)

## 2024-05-19 LAB — HEMOGLOBIN A1C
Hgb A1c MFr Bld: 5.4 % (ref 4.8–5.6)
Mean Plasma Glucose: 108.28 mg/dL

## 2024-05-19 LAB — MAGNESIUM: Magnesium: 1.9 mg/dL (ref 1.7–2.4)

## 2024-05-19 MED ORDER — AMIODARONE HCL IN DEXTROSE 360-4.14 MG/200ML-% IV SOLN
30.0000 mg/h | INTRAVENOUS | Status: DC
  Administered 2024-05-19 (×2): 30 mg/h via INTRAVENOUS
  Filled 2024-05-19: qty 200

## 2024-05-19 MED ORDER — AMIODARONE LOAD VIA INFUSION
150.0000 mg | Freq: Once | INTRAVENOUS | Status: AC
  Administered 2024-05-19: 150 mg via INTRAVENOUS
  Filled 2024-05-19: qty 83.34

## 2024-05-19 MED ORDER — AMIODARONE HCL IN DEXTROSE 360-4.14 MG/200ML-% IV SOLN
60.0000 mg/h | INTRAVENOUS | Status: AC
  Administered 2024-05-19 (×2): 60 mg/h via INTRAVENOUS
  Filled 2024-05-19 (×2): qty 200
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
## 2024-05-19 NOTE — Progress Notes (Addendum)
      [address] [place] [place] [address]             [phone]      5 Days Post-Op Procedure(s) (LRB): LOBECTOMY, LUNG, ROBOT-ASSISTED, USING VATS (Right) BLOCK, NERVE, INTERCOSTAL (Right) LYMPH NODE BIOPSY (Right) Subjective: Patient without new complaints this AM. She reports she does not feel any different in afib.   Objective: Vital signs in last 24 hours: Temp:  [97.8 F (36.6 C)-98.6 F (37 C)] 98 F (36.7 C) (11/25 0355) Pulse Rate:  [65-75] 75 (11/25 0355) Cardiac Rhythm: Atrial fibrillation (11/25 0541) Resp:  [18-20] 18 (11/25 0355) BP: (110-161)/(56-91) 149/62 (11/25 0355) SpO2:  [94 %-98 %] 95 % (11/25 0355)  Hemodynamic parameters for last 24 hours:    Intake/Output from previous day: 11/24 0701 - 11/25 0700 In: 460 [P.O.:460] Out: -  Intake/Output this shift: No intake/output data recorded.  General appearance: alert, cooperative, and no distress Neurologic: intact Heart: irregularly irregular rhythm Lungs: diminished right apical breath sounds Abdomen: soft, non-tender; bowel sounds normal; no masses,  no organomegaly Extremities: extremities normal, atraumatic, no cyanosis or edema Wound: Clean and dry dressing in place  Lab Results: Recent Labs    05/18/24 0154  WBC 6.8  HGB 10.1*  HCT 30.8*  PLT 211   BMET:  Recent Labs    05/17/24 0200 05/18/24 0154  NA 136 135  K 4.3 3.6  CL 101 98  CO2 26 30  GLUCOSE 68* 68*  BUN 10 12  CREATININE 0.55 0.58  CALCIUM  9.1 9.2    PT/INR: No results for input(s): LABPROT, INR in the last 72 hours. ABG    Component Value Date/Time   PHART 7.416 04/10/2019 0617   HCO3 27.1 04/10/2019 0617   TCO2 28 04/10/2019 0617   O2SAT 99.2 04/10/2019 0643   CBG (last 3)  No results for input(s): GLUCAP in the last 72 hours.  Assessment/Plan: S/P Procedure(s) (LRB): LOBECTOMY, LUNG, ROBOT-ASSISTED, USING VATS (Right) BLOCK, NERVE, INTERCOSTAL (Right) LYMPH NODE BIOPSY  (Right)  Neuro: Pain overall controlled, continue current pain regimen   CV: Some HTN, likely present prior to admission and episodes of SVT, started on Cozaar  50mg  daily and Lopressor  25mg  BID. SBP mostly 120s-140s. In afib with RVR this AM, HR 120s-140s this AM. Will start IV Amiodarone  protocol. Will check Mg. May be able to titrate Lopressor .    Pulm: Saturating well on RA. Last CXR with stable moderate sized apical space from bilobectomy. Encourage IS and ambulation   GI: Tolerating a diet, no further loose BMs   Renal: Last Cr 0.58, stable. Last K 3.6, will get bmet this AM   DVT Prophylaxis: Lovenox   Deconditioning: Some dizziness with ambulation yesterday. Continue work with PT/OT, will need HH PT/OT at discharge   Dispo: Work on heart rate and rhythm today.      LOS: 5 days    Con GORMAN Bend, PA-C 05/19/2024 Patient seen and examined, agree with above Went into AF with RVR this AM- amiodarone  bolus being given now  Elspeth C. Kerrin, MD [facility] [phone]

## 2024-05-19 NOTE — Consult Note (Signed)
 Cardiology Consultation   Patient ID: HERO MCCATHERN MRN: [id number]; DOB: 1946-04-08  Admit date: 05/14/2024 Date of Consult: 05/19/2024  PCP:  Knute Thersia Bitters, FNP   [facility] Providers Cardiologist:  None   {   Patient Profile: [name] is a 78 y.o. female with a hx of hypertension, dyslipidemia, hx of DVT, tobacco use and colon cancer with right lung mass who is being seen 05/19/2024 for the evaluation of atrial fibrillation at the request of [name].  History of Present Illness: [name] no prior cardiac history.  She does have a history of colon cancer dating back to 2013.  She had a partial colectomy followed by chemotherapy, and had been stable since 2014.  February 2025 patient was involved in [facility] where CT found incidental new RUL masse, biopsy confirmed metastatic colon adenocarcinoma.  Patient was admitted on 11/20 for right lobectomy with lymph node resection. Surgery was without complication, and patient has been progressing well. She has been hypertensive, ARB added for control. Patient also went into AF. She was initially placed on lopressor , though had breakthrough AF RVR [HR 120-140]. She is now on IV amiodarone .  No echocardiogram on file.   Today:  K 3.7   Mag 1.9 TSH pending LFTs WNL on 11/22.  Patient has been in current episode of AF since 0540 this am. Currently rate controlled.  On interview, patient reported palpitations around onset time, though denied them currently. Denied left sided chest pain and shortness of breath. No lightheadedness/dizziness at rest. Has noticed some dizziness with ambulation.  No prior history of AF   Past Medical History:  Diagnosis Date   Abdominal pain    Allergy    Anemia    Blood transfusion without reported diagnosis 1980   Cancer (HCC) 11/2011   cancer in colon   Colon cancer (HCC) 12/18/2011   DVT (deep venous thrombosis) (HCC) 05/2012   (R)IJ and subclavian, stopped  Coumadin  July 2014   History of kidney stones    Irregular heart rate    since child    Past Surgical History:  Procedure Laterality Date   CHOLECYSTECTOMY     COLON RESECTION  12/18/2011   Procedure: COLON RESECTION LAPAROSCOPIC;  Surgeon: [name], MD;  Location: [facility] OR;  Service: General;  Laterality: N/A;   COLON SURGERY     COLONOSCOPY     DILATATION & CURRETTAGE/HYSTEROSCOPY WITH RESECTOCOPE N/A 02/03/2013   Procedure: DILATATION & CURETTAGE/HYSTEROSCOPY, removal  of endometrial mass;  Surgeon: [name], MD;  Location: [facility] ORS;  Service: Gynecology;  Laterality: N/A;   goiter removal      INTERCOSTAL NERVE BLOCK Right 05/14/2024   Procedure: BLOCK, NERVE, INTERCOSTAL;  Surgeon: [name], MD;  Location: [facility] OR;  Service: Thoracic;  Laterality: Right;   KIDNEY STONE SURGERY     LOBECTOMY, LUNG, ROBOT-ASSISTED, USING VATS Right 05/14/2024   Procedure: LOBECTOMY, LUNG, ROBOT-ASSISTED, USING VATS;  Surgeon: [name], MD;  Location: [facility] OR;  Service: Thoracic;  Laterality: Right;  Robotic assisted right upper and middle bilobectomy   LYMPH NODE BIOPSY Right 05/14/2024   Procedure: LYMPH NODE BIOPSY;  Surgeon: [name], MD;  Location: [facility] OR;  Service: Thoracic;  Laterality: Right;   PORTACATH PLACEMENT  11/2011   12 treatments of chemo/ last treatment 2014   portacath removal  12/30/12   TONSILLECTOMY     as child       Scheduled Meds:  enoxaparin  (  LOVENOX ) injection  40 mg Subcutaneous Daily   gabapentin   300 mg Oral BID   losartan   50 mg Oral Daily   metoprolol  tartrate  25 mg Oral BID   pantoprazole   40 mg Oral Daily   Continuous Infusions:  amiodarone  60 mg/hr (05/19/24 1145)   Followed by   amiodarone      PRN Meds: morphine  injection, ondansetron  (ZOFRAN ) IV, oxyCODONE , traMADol   Allergies:    Allergies  Allergen Reactions   Aspirin Nausea And Vomiting   Advil  [Ibuprofen ] Palpitations   Aleve [Naproxen Sodium]  Palpitations   Penicillins Rash    Social History:   Social History   Socioeconomic History   Marital status: Widowed    Spouse name: Not on file   Number of children: 4   Years of education: Not on file   Highest education level: Some college, no degree  Occupational History   Occupation: Immunologist: [employer]  Tobacco Use   Smoking status: Some Days    Current packs/day: 0.25    Average packs/day: 0.3 packs/day for 35.0 years (8.8 ttl pk-yrs)    Types: Cigarettes    Passive exposure: Current   Smokeless tobacco: Never   Tobacco comments:    smoke 2 cigarettes a day/occasional  Vaping Use   Vaping status: Never Used  Substance and Sexual Activity   Alcohol use: No   Drug use: No   Sexual activity: Not on file  Other Topics Concern   Not on file  Social History Narrative   ** Merged History Encounter **       Widowed, currently lives with son, [name]   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      Children:   [name]   [name]   [name]-daughter-in-law   [name]   Social Drivers of Health   Financial Resource Strain: Low Risk  (09/08/2023)   Overall Financial Resource Strain (CARDIA)    Difficulty of Paying Living Expenses: Not very hard  Recent Concern: Financial Resource Strain - Medium Risk (08/07/2023)   Overall Financial Resource Strain (CARDIA)    Difficulty of Paying Living Expenses: Somewhat hard  Food Insecurity: No Food Insecurity (05/14/2024)   Hunger Vital Sign    Worried About Running Out of Food in the Last Year: Never true    Ran Out of Food in the Last Year: Never true  Transportation Needs: No Transportation Needs (05/14/2024)   PRAPARE - Administrator, Civil Service (Medical): No    Lack of Transportation (Non-Medical): No  Physical Activity: Sufficiently Active (09/08/2023)   Exercise Vital Sign    Days of Exercise per Week: 3 days    Minutes of  Exercise per Session: 120 min  Recent Concern: Physical Activity - Insufficiently Active (08/07/2023)   Exercise Vital Sign    Days of Exercise per Week: 2 days    Minutes of Exercise per Session: 60 min  Stress: No Stress Concern Present (09/08/2023)   [employer] of Occupational Health - Occupational Stress Questionnaire    Feeling of Stress : Not at all  Social Connections: Moderately Isolated (05/15/2024)   Social Connection and Isolation Panel    Frequency of Communication with Friends and Family: More than three times a week    Frequency of Social Gatherings with Friends and Family: More than three times a week    Attends Religious Services:  More than 4 times per year    Active Member of Clubs or Organizations: No    Attends Banker Meetings: Never    Marital Status: Widowed  Intimate Partner Violence: Not At Risk (05/14/2024)   Humiliation, Afraid, Rape, and Kick questionnaire    Fear of Current or Ex-Partner: No    Emotionally Abused: No    Physically Abused: No    Sexually Abused: No    Family History:   Family History  Problem Relation Age of Onset   Breast cancer Mother    Heart disease Mother    Hypertension Other    Colon polyps Neg Hx    Esophageal cancer Neg Hx    Stomach cancer Neg Hx    Rectal cancer Neg Hx    Colon cancer Neg Hx      ROS:  Please see the history of present illness.  All other ROS reviewed and negative.     Physical Exam/Data: Vitals:   05/19/24 0355 05/19/24 0742 05/19/24 1038 05/19/24 1118  BP: (!) 149/62 118/70 130/73 124/62  Pulse: 75 (!) 128 (!) 110 91  Resp: 18 18  19   Temp: 98 F (36.7 C) 98 F (36.7 C)  98 F (36.7 C)  TempSrc: Oral Oral  Oral  SpO2: 95% 95%  95%  Weight:      Height:        Intake/Output Summary (Last 24 hours) at 05/19/2024 1334 Last data filed at 05/19/2024 0801 Gross per 24 hour  Intake 240 ml  Output --  Net 240 ml      05/14/2024    5:51 AM 05/12/2024   12:53 PM  05/05/2024   11:55 AM  Last 3 Weights  Weight (lbs) 133 lb 132 lb 9.6 oz 133 lb 14.4 oz  Weight (kg) 60.328 kg 60.147 kg 60.737 kg     Body mass index is 22.13 kg/m.  General:  Pleasant older woman in no acute distress HEENT: normal Neck: no JVD Vascular: Distal pulses 2+ bilaterally Cardiac:  Irregularly, irregular, no murmur Lungs:  Absent to upper right, mild crackling to lower right, CTA to left  Ext: no edema Musculoskeletal:  No deformities Skin: warm and dry  Psych:  Normal affect   EKG:  The EKG was personally reviewed and demonstrates:  None, will order Telemetry:  Telemetry was personally reviewed and demonstrates:  rate controlled AF HR 75, went into AF RVR HR 120-140 at 0540  Relevant CV Studies: None on file  Laboratory Data:  Chemistry Recent Labs  Lab 05/17/24 0200 05/18/24 0154 05/19/24 0827  NA 136 135 135  K 4.3 3.6 3.7  CL 101 98 98  CO2 26 30 23   GLUCOSE 68* 68* 183*  BUN 10 12 12   CREATININE 0.55 0.58 0.68  CALCIUM  9.1 9.2 9.1  MG 1.9  --  1.9  GFRNONAA >60 >60 >60  ANIONGAP 9 7 14     Recent Labs  Lab 05/12/24 1345 05/16/24 0248  PROT 7.2 5.9*  ALBUMIN  3.9 3.2*  AST 24 31  ALT 13 16  ALKPHOS 83 58  BILITOT 0.6 0.6   Hematology Recent Labs  Lab 05/15/24 0219 05/16/24 0248 05/18/24 0154  WBC 9.7 8.3 6.8  RBC 3.36* 3.35* 3.41*  HGB 9.9* 9.9* 10.1*  HCT 30.4* 30.2* 30.8*  MCV 90.5 90.1 90.3  MCH 29.5 29.6 29.6  MCHC 32.6 32.8 32.8  RDW 12.8 12.9 12.6  PLT 187 186 211    Radiology/Studies:  DG  Chest 1 View Result Date: 05/17/2024 CLINICAL DATA:  [id number] Encounter for chest tube removal [id number] EXAM: DG CHEST 1V COMPARISON:  May 16, 2024, May 15, 2024, May 14, 2024 FINDINGS: The cardiomediastinal silhouette is unchanged in contour.Removal of RIGHT-sided chest tube. No pleural effusion. Similar mild to moderate RIGHT pneumothorax since approximately November 20. No acute pleuroparenchymal abnormality. IMPRESSION:  Similar mild to moderate RIGHT pneumothorax since November 20 status post RIGHT chest tube removal. Electronically Signed   By: Corean Salter M.D.   On: 05/17/2024 08:56   DG Chest Port 1 View Result Date: 05/16/2024 EXAM: 1 VIEW(S) XRAY OF THE CHEST 05/16/2024 07:10:00 AM COMPARISON: 05/15/2024 CLINICAL HISTORY: Pneumothorax, right FINDINGS: LINES, TUBES AND DEVICES: Stable right chest tube. LUNGS AND PLEURA: Postsurgical changes from right upper lobectomy. Stable right apical pneumothorax. No pleural effusion. HEART AND MEDIASTINUM: No acute abnormality of the cardiac and mediastinal silhouettes. BONES AND SOFT TISSUES: No acute osseous abnormality. IMPRESSION: 1. Stable right apical pneumothorax with right chest tube in place. 2. Postsurgical changes from right upper lobectomy. Electronically signed by: Waddell Calk MD 05/16/2024 07:32 AM EST RP Workstation: [id number]     Assessment and Plan:  Atrial Fibrillation, with episode of RVR  Currently in rate controlled AF Chad Vas score 4, though echo and A1c pending.  Etiology most likely 2/2 surgery.  TSH pending, patient has a history of right thyroid  lobectomy  Will order echocardiogram   Given recent surgery unable to pursue rhythm control with DCCV until able to anticoagulate. Will pursue rate control for now. Would like to use amiodarone  short term given history of thyroid  disease and now s/p lung lobectomy.   Continue IV amiodarone , convert to oral tomorrow  Continue lopressor  25 mg BID Will hold on anticoagulation until okay by surgery   Hypertension BP: 124/62 Continue cozaar  50 mg  BB as above  Dyslipidemia Lipid panel pending in am   Tobacco Use Encouraged cessation  Per primary  Per primary Stage 4 Colon cancer with lung met s/p lobectomy  Hx of DVT  Risk Assessment/Risk Scores:         CHA2DS2-VASc Score = 4 A1c and echocardiogram pending   This indicates a 4.8% annual risk of stroke. The patient's  score is based upon: CHF History: 0 HTN History: 1 Diabetes History: 0 Stroke History: 0 Vascular Disease History: 0 Age Score: 2 Gender Score: 1        For questions or updates, please contact [facility] Please consult [email or web address] for contact info under      Signed, Leontine LOISE Salen, PA-C  05/19/2024 1:34 PM

## 2024-05-19 NOTE — Plan of Care (Signed)
  Problem: Education: Goal: Knowledge of General Education information will improve Description: Including pain rating scale, medication(s)/side effects and non-pharmacologic comfort measures Outcome: Progressing   Problem: Clinical Measurements: Goal: Ability to maintain clinical measurements within normal limits will improve Outcome: Progressing Goal: Will remain free from infection Outcome: Progressing Goal: Respiratory complications will improve Outcome: Progressing Goal: Cardiovascular complication will be avoided Outcome: Progressing   Problem: Activity: Goal: Risk for activity intolerance will decrease Outcome: Progressing   Problem: Elimination: Goal: Will not experience complications related to bowel motility Outcome: Progressing Goal: Will not experience complications related to urinary retention Outcome: Progressing   Problem: Pain Managment: Goal: General experience of comfort will improve and/or be controlled Outcome: Progressing   Problem: Safety: Goal: Ability to remain free from injury will improve Outcome: Progressing   Problem: Skin Integrity: Goal: Risk for impaired skin integrity will decrease Outcome: Progressing   Problem: Education: Goal: Knowledge of disease or condition will improve Outcome: Progressing Goal: Knowledge of the prescribed therapeutic regimen will improve Outcome: Progressing   Problem: Pain Management: Goal: Pain level will decrease Outcome: Progressing   Problem: Skin Integrity: Goal: Wound healing without signs and symptoms infection will improve Outcome: Progressing

## 2024-05-19 NOTE — Progress Notes (Signed)
 OT Cancellation Note  Patient Details Name: [name] MRN: [id number] DOB: Nov 10, 1945   Cancelled Treatment:    Reason Eval/Treat Not Completed: Other (comment) (HR 125; pt c/o L lower calf discomfort. Nsg made aware. Will check back at a later time.)  [facility] 05/19/2024, 10:38 AM Kreg Sink, OT/L   Acute OT Clinical Specialist Acute Rehabilitation Services Pager [phone] Office [phone]

## 2024-05-19 NOTE — Progress Notes (Signed)
 Mobility Specialist Progress Note:   05/19/24 1000  Mobility  Activity Ambulated with assistance  Level of Assistance Contact guard assist, steadying assist  Assistive Device Other (Comment) (HHA)  Distance Ambulated (ft) 10 ft  Activity Response Tolerated fair  Mobility Referral Yes  Mobility visit 1 Mobility  Mobility Specialist Start Time (ACUTE ONLY) 0940  Mobility Specialist Stop Time (ACUTE ONLY) 0954  Mobility Specialist Time Calculation (min) (ACUTE ONLY) 14 min   Pt received in bed agreeable to mobility. No physical assistance throughout just contact guard via HHA for safety. Pt request to use the BR. Deferred further mobility d/t HR reaching 150 in short distance. Returned to bed w/ call bell and personal belongings in reach. All needs met. RN aware. Pre Mobility HR 94 During Mobility HR 100-150 Post Mobility HR 106  Thersia Minder Mobility Specialist  Please contact vis Secure Chat or  Rehab Office [phone]

## 2024-05-19 NOTE — Plan of Care (Signed)
  Problem: Education: Goal: Knowledge of General Education information will improve Description: Including pain rating scale, medication(s)/side effects and non-pharmacologic comfort measures Outcome: Progressing   Problem: Health Behavior/Discharge Planning: Goal: Ability to manage health-related needs will improve Outcome: Progressing   Problem: Clinical Measurements: Goal: Ability to maintain clinical measurements within normal limits will improve Outcome: Progressing Goal: Will remain free from infection Outcome: Progressing Goal: Diagnostic test results will improve Outcome: Progressing Goal: Respiratory complications will improve Outcome: Progressing Goal: Cardiovascular complication will be avoided Outcome: Progressing   Problem: Activity: Goal: Risk for activity intolerance will decrease Outcome: Progressing   Problem: Nutrition: Goal: Adequate nutrition will be maintained Outcome: Progressing   Problem: Coping: Goal: Level of anxiety will decrease Outcome: Progressing   Problem: Elimination: Goal: Will not experience complications related to bowel motility Outcome: Progressing Goal: Will not experience complications related to urinary retention Outcome: Progressing   Problem: Pain Managment: Goal: General experience of comfort will improve and/or be controlled Outcome: Progressing   Problem: Safety: Goal: Ability to remain free from injury will improve Outcome: Progressing   Problem: Skin Integrity: Goal: Risk for impaired skin integrity will decrease Outcome: Progressing   Problem: Education: Goal: Knowledge of disease or condition will improve Outcome: Progressing   Problem: Activity: Goal: Risk for activity intolerance will decrease Outcome: Progressing   Problem: Cardiac: Goal: Will achieve and/or maintain hemodynamic stability Outcome: Progressing   Problem: Clinical Measurements: Goal: Postoperative complications will be avoided or  minimized Outcome: Progressing   Problem: Respiratory: Goal: Respiratory status will improve Outcome: Progressing   Problem: Pain Management: Goal: Pain level will decrease Outcome: Progressing   Problem: Skin Integrity: Goal: Wound healing without signs and symptoms infection will improve Outcome: Progressing

## 2024-05-19 NOTE — Progress Notes (Signed)
  Echocardiogram 2D Echocardiogram has been performed.  LAMON MAXWELL 05/19/2024, 2:56 PM

## 2024-05-20 DIAGNOSIS — I4891 Unspecified atrial fibrillation: Secondary | ICD-10-CM | POA: Diagnosis not present

## 2024-05-20 DIAGNOSIS — I9789 Other postprocedural complications and disorders of the circulatory system, not elsewhere classified: Secondary | ICD-10-CM | POA: Diagnosis not present

## 2024-05-20 DIAGNOSIS — Z902 Acquired absence of lung [part of]: Secondary | ICD-10-CM | POA: Diagnosis not present

## 2024-05-20 DIAGNOSIS — I10 Essential (primary) hypertension: Secondary | ICD-10-CM | POA: Diagnosis not present

## 2024-05-20 LAB — BASIC METABOLIC PANEL WITH GFR
Anion gap: 10 (ref 5–15)
BUN: 10 mg/dL (ref 8–23)
CO2: 27 mmol/L (ref 22–32)
Calcium: 9.3 mg/dL (ref 8.9–10.3)
Chloride: 100 mmol/L (ref 98–111)
Creatinine, Ser: 0.71 mg/dL (ref 0.44–1.00)
GFR, Estimated: >60 mL/min (ref >60)
Glucose, Bld: 101 mg/dL — ABNORMAL HIGH (ref 70–99)
Potassium: 3.5 mmol/L (ref 3.5–5.1)
Sodium: 137 mmol/L (ref 135–145)

## 2024-05-20 LAB — SURGICAL PATHOLOGY

## 2024-05-20 LAB — LIPID PANEL
Cholesterol: 125 mg/dL (ref 0–200)
HDL: 48 mg/dL (ref >40)
LDL Cholesterol: 61 mg/dL (ref 0–99)
Total CHOL/HDL Ratio: 2.6 ratio
Triglycerides: 79 mg/dL (ref <150)
VLDL: 16 mg/dL (ref 0–40)

## 2024-05-20 MED ORDER — AMIODARONE HCL 200 MG PO TABS
200.0000 mg | ORAL_TABLET | Freq: Two times a day (BID) | ORAL | Status: DC
  Administered 2024-05-20 – 2024-05-22 (×5): 200 mg via ORAL
  Filled 2024-05-20 (×5): qty 1

## 2024-05-20 MED ORDER — METOPROLOL SUCCINATE ER 50 MG PO TB24
50.0000 mg | ORAL_TABLET | Freq: Every day | ORAL | Status: DC
  Administered 2024-05-20: 50 mg via ORAL
  Filled 2024-05-20: qty 1

## 2024-05-20 MED ORDER — POTASSIUM CHLORIDE CRYS ER 20 MEQ PO TBCR
40.0000 meq | EXTENDED_RELEASE_TABLET | Freq: Once | ORAL | Status: AC
  Administered 2024-05-20: 40 meq via ORAL
  Filled 2024-05-20: qty 2

## 2024-05-20 MED ORDER — LOSARTAN POTASSIUM 25 MG PO TABS
25.0000 mg | ORAL_TABLET | Freq: Every day | ORAL | Status: DC
  Administered 2024-05-21 – 2024-05-22 (×2): 25 mg via ORAL
  Filled 2024-05-20 (×2): qty 1

## 2024-05-20 MED ORDER — MAGNESIUM OXIDE -MG SUPPLEMENT 400 (240 MG) MG PO TABS
400.0000 mg | ORAL_TABLET | Freq: Two times a day (BID) | ORAL | Status: DC
  Administered 2024-05-20 – 2024-05-22 (×5): 400 mg via ORAL
  Filled 2024-05-20 (×5): qty 1
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
## 2024-05-20 NOTE — Plan of Care (Signed)
  Problem: Education: Goal: Knowledge of General Education information will improve Description: Including pain rating scale, medication(s)/side effects and non-pharmacologic comfort measures Outcome: Progressing   Problem: Health Behavior/Discharge Planning: Goal: Ability to manage health-related needs will improve Outcome: Progressing   Problem: Clinical Measurements: Goal: Ability to maintain clinical measurements within normal limits will improve Outcome: Progressing   Problem: Activity: Goal: Risk for activity intolerance will decrease Outcome: Progressing   Problem: Nutrition: Goal: Adequate nutrition will be maintained Outcome: Progressing   Problem: Safety: Goal: Ability to remain free from injury will improve Outcome: Progressing   Problem: Skin Integrity: Goal: Risk for impaired skin integrity will decrease Outcome: Progressing   Problem: Education: Goal: Knowledge of disease or condition will improve Outcome: Progressing Goal: Knowledge of the prescribed therapeutic regimen will improve Outcome: Progressing   Problem: Cardiac: Goal: Will achieve and/or maintain hemodynamic stability Outcome: Progressing   Problem: Clinical Measurements: Goal: Postoperative complications will be avoided or minimized Outcome: Progressing   Problem: Skin Integrity: Goal: Wound healing without signs and symptoms infection will improve Outcome: Progressing

## 2024-05-20 NOTE — Progress Notes (Signed)
 Mobility Specialist Progress Note:    05/20/24 1214  Therapy Vitals  Temp 98.4 F (36.9 C)  Temp Source Oral  Pulse Rate 68  Resp 19  BP (!) 128/54  Patient Position (if appropriate) Lying  Oxygen Therapy  SpO2 95 %  O2 Device Room Air  Mobility  Activity Ambulated with assistance  Level of Assistance Contact guard assist, steadying assist  Assistive Device Front wheel walker  Distance Ambulated (ft) 400 ft  Activity Response Tolerated well  Mobility Referral Yes  Mobility visit 1 Mobility  Mobility Specialist Start Time (ACUTE ONLY) [id number]  Mobility Specialist Stop Time (ACUTE ONLY) 1005  Mobility Specialist Time Calculation (min) (ACUTE ONLY) 15 min   Received pt in chair having no complaints and agreeable to mobility. Pt was asymptomatic throughout ambulation. HR stayed between 80-90. Returned to room w/o fault. Left in chair w/ call bell in reach and all needs met.   [name] Mobility Specialist  Please contact vis Secure Chat or  Rehab Office [phone]

## 2024-05-20 NOTE — Progress Notes (Signed)
 Rounding Note    Patient Name: [name] Date of Encounter: 05/20/2024  [facility] Cardiologist: Shelda Bruckner, MD   Subjective   No acute events overnight. Remained in SR on amiodarone  drip. Discussed plan for medications as below. We we discussed afib at length, monitoring, treatment. Family in room, asking about apple watch, demonstrated on mine with them.  Inpatient Medications    Scheduled Meds:  amiodarone   200 mg Oral BID   enoxaparin  (LOVENOX ) injection  40 mg Subcutaneous Daily   gabapentin   300 mg Oral BID   [START ON 05/21/2024] losartan   25 mg Oral Daily   magnesium  oxide  400 mg Oral BID   metoprolol  succinate  50 mg Oral Daily   pantoprazole   40 mg Oral Daily   Continuous Infusions:   PRN Meds: morphine  injection, ondansetron  (ZOFRAN ) IV, oxyCODONE , traMADol    Vital Signs    Vitals:   05/19/24 2102 05/19/24 2308 05/20/24 0449 05/20/24 0814  BP: (!) 106/58 (!) 100/52 (!) 101/54 112/62  Pulse: 77 (!) 57 70 70  Resp:  18 20 18   Temp:  98 F (36.7 C) 98.1 F (36.7 C) 98.3 F (36.8 C)  TempSrc:  Oral Oral Oral  SpO2:  94% 93% 93%  Weight:      Height:        Intake/Output Summary (Last 24 hours) at 05/20/2024 1021 Last data filed at 05/20/2024 0700 Gross per 24 hour  Intake 917.89 ml  Output 350 ml  Net 567.89 ml      05/14/2024    5:51 AM 05/12/2024   12:53 PM 05/05/2024   11:55 AM  Last 3 Weights  Weight (lbs) 133 lb 132 lb 9.6 oz 133 lb 14.4 oz  Weight (kg) 60.328 kg 60.147 kg 60.737 kg      Telemetry    SR since yesterday afternoon - Personally Reviewed  Physical Exam   GEN: No acute distress.   Neck: No JVD Cardiac: RRR, no murmurs, rubs, or gallops.  Respiratory: Clear to auscultation on L, diminished right upper/middle fields GI: Soft, nontender, non-distended  MS: No edema; No deformity. Neuro:  Nonfocal  Psych: Normal affect   New pertinent results (labs, ECG, imaging, cardiac studies)     Echo personally reviewed  Assessment & Plan    Postoperative atrial fibrillation with RVR -acute onset AM 11/25, converted 1:38 PM same day on IV amiodarone  -stopping IV amiodarone , starting oral. Would do 200 mg BID for 14 days, then 200 mg daily. Can assess as follow up  -less than 12 hours of afib total. Given this, if does not recur, will hold on anticoagulation. If does recur, then would need to discuss DOAC with surgical team. -echo personally reviewed, reassuring -patient did have symptoms in afib. Discussed that if she feels anything like this, she should let our office know, we can get EKG and if not definitive, a monitor. If she has recurrence outside postop window, would treat as paroxsymal atrial fibrillation and start anticoagulation -she asked about apple watch. I showed her the ECG function on my watch, discussed heart rate alerts. She is going to look into this with her family -CHA2DS2/VAS Stroke Risk Points=6   Hypertension: trending lower, 101/54 prior to meds this morning. Will decrease losartan  dose (already received this AM), metoprolol  (consolidated metoprolol  tartrate to succinate this AM)   History of dyslipidemia per report, not on meds: Lipids at goal on no meds, LDL 61.    Tobacco use: encouraged cessation  Metastatic colon cancer with lung metastasis s/p RUL/RML bilobectomy  [facility] will sign off in anticipation of discharge within 48 hours, but please contact us  with any questions or concerns  Medication Recommendations:   Amiodarone  200 mg BID for 14 days, then 200 mg daily. Total duration 90 days if no atrial fibrillation recurrence Losartan  25 mg daily (if blood pressure runs lower, may need to hold) Metoprolol  succinate 50 mg daily)  Other recommendations (labs, testing, etc):  if she has intermittent symptoms that sound like possible afib, would get 2 week Zio to evaluate Follow up as an outpatient:  Scheduled appt with Reche Finder, NP  on 06/05/24 at [facility] office     Signed, Shelda Bruckner, MD  05/20/2024, 10:21 AM

## 2024-05-20 NOTE — Progress Notes (Addendum)
      [address] [place] [place] [address]             [phone]      6 Days Post-Op Procedure(s) (LRB): LOBECTOMY, LUNG, ROBOT-ASSISTED, USING VATS (Right) BLOCK, NERVE, INTERCOSTAL (Right) LYMPH NODE BIOPSY (Right) Subjective: Patient reports she has not had any more dizziness with ambulation.   Objective: Vital signs in last 24 hours: Temp:  [98 F (36.7 C)-99.8 F (37.7 C)] 98.1 F (36.7 C) (11/26 0449) Pulse Rate:  [57-128] 70 (11/26 0449) Cardiac Rhythm: Normal sinus rhythm (11/25 1900) Resp:  [18-20] 20 (11/26 0449) BP: (100-130)/(52-73) 101/54 (11/26 0449) SpO2:  [93 %-95 %] 93 % (11/26 0449)  Hemodynamic parameters for last 24 hours:    Intake/Output from previous day: 11/25 0701 - 11/26 0700 In: 1157.9 [P.O.:600; I.V.:557.9] Out: 350 [Urine:350] Intake/Output this shift: No intake/output data recorded.  General appearance: alert, cooperative, and no distress Neurologic: intact Heart: regular rate and rhythm, S1, S2 normal, no murmur, click, rub or gallop Lungs: diminished left apical breath sounds Abdomen: soft, non-tender; bowel sounds normal; no masses,  no organomegaly Extremities: extremities normal, atraumatic, no cyanosis or edema Wound: Clean and dry without sign of infection  Lab Results: Recent Labs    05/18/24 0154  WBC 6.8  HGB 10.1*  HCT 30.8*  PLT 211   BMET:  Recent Labs    05/19/24 0827 05/20/24 0339  NA 135 137  K 3.7 3.5  CL 98 100  CO2 23 27  GLUCOSE 183* 101*  BUN 12 10  CREATININE 0.68 0.71  CALCIUM  9.1 9.3    PT/INR: No results for input(s): LABPROT, INR in the last 72 hours. ABG    Component Value Date/Time   PHART 7.416 04/10/2019 0617   HCO3 27.1 04/10/2019 0617   TCO2 28 04/10/2019 0617   O2SAT 99.2 04/10/2019 0643   CBG (last 3)  No results for input(s): GLUCAP in the last 72 hours.  Assessment/Plan: S/P Procedure(s) (LRB): LOBECTOMY, LUNG, ROBOT-ASSISTED, USING VATS  (Right) BLOCK, NERVE, INTERCOSTAL (Right) LYMPH NODE BIOPSY (Right)  Neuro: Pain overall controlled, continue current pain regimen   CV: Some HTN, likely present prior to admission and episodes of SVT, started on Cozaar  50mg  daily and Lopressor  25mg  BID. SBP mostly 100-120. Developed afib with RVR yesterday AM, converted to NSR HR 60s this AM. Will transition to PO Amiodarone . Hold on DOAC unless she develops afib again.    Pulm: Saturating well on RA. Last CXR with stable moderate sized apical space from bilobectomy. Encourage IS and ambulation   GI: Tolerating a diet, +BM   Renal: Last Cr 0.58, stable. K 3.5, supplement. Mg 1.9, supplement.    DVT Prophylaxis: Lovenox    Deconditioning: Dizziness resolved. Continue work with PT/OT, will need HH PT/OT at discharge   Dispo: Hopefully if heart rate and rhythm remain stable can d/c home tomorrow AM   LOS: 6 days    Con GORMAN Bend, PA-C 05/20/2024  Patient seen and examined, agree with above Change amiodarone  to PO Hopefully home tomorrow Path still pending  Elspeth BROCKS. Kerrin, MD [facility] [phone]

## 2024-05-20 NOTE — Progress Notes (Signed)
 PT Cancellation Note  Patient Details Name: [name] MRN: [id number] DOB: 04/02/1946   Cancelled Treatment:    Reason Eval/Treat Not Completed: Other (comment). Pt just returned from amb. Will check back in a little while.   Rodgers ORN [facility] 05/20/2024, 10:47 AM  [facility] PT Acute Rehabilitation Services Office [phone]

## 2024-05-21 ENCOUNTER — Inpatient Hospital Stay (HOSPITAL_COMMUNITY)

## 2024-05-21 LAB — BASIC METABOLIC PANEL WITH GFR
Anion gap: 9 (ref 5–15)
BUN: 9 mg/dL (ref 8–23)
CO2: 28 mmol/L (ref 22–32)
Calcium: 9.1 mg/dL (ref 8.9–10.3)
Chloride: 99 mmol/L (ref 98–111)
Creatinine, Ser: 0.6 mg/dL (ref 0.44–1.00)
GFR, Estimated: >60 mL/min (ref >60)
Glucose, Bld: 105 mg/dL — ABNORMAL HIGH (ref 70–99)
Potassium: 3.6 mmol/L (ref 3.5–5.1)
Sodium: 136 mmol/L (ref 135–145)

## 2024-05-21 MED ORDER — AMIODARONE IV BOLUS ONLY 150 MG/100ML: 150.0000 mg | Freq: Once | INTRAVENOUS | Status: DC

## 2024-05-21 MED ORDER — METOPROLOL SUCCINATE ER 50 MG PO TB24
75.0000 mg | ORAL_TABLET | Freq: Every day | ORAL | Status: DC
  Administered 2024-05-21 – 2024-05-22 (×2): 75 mg via ORAL
  Filled 2024-05-21 (×2): qty 1

## 2024-05-21 MED ORDER — POTASSIUM CHLORIDE CRYS ER 20 MEQ PO TBCR
40.0000 meq | EXTENDED_RELEASE_TABLET | Freq: Once | ORAL | Status: AC
  Administered 2024-05-21: 40 meq via ORAL
  Filled 2024-05-21: qty 2

## 2024-05-21 MED ORDER — APIXABAN 5 MG PO TABS
5.0000 mg | ORAL_TABLET | Freq: Two times a day (BID) | ORAL | Status: DC
  Administered 2024-05-21 – 2024-05-22 (×3): 5 mg via ORAL
  Filled 2024-05-21 (×3): qty 1

## 2024-05-21 NOTE — Progress Notes (Signed)
 Mobility Specialist: Progress Note   05/21/24 1200  Mobility  Activity Ambulated with assistance  Level of Assistance Standby assist, set-up cues, supervision of patient - no hands on  Assistive Device Four wheel walker  Distance Ambulated (ft) 400 ft  Activity Response Tolerated well  Mobility Referral Yes  Mobility visit 1 Mobility  Mobility Specialist Start Time (ACUTE ONLY) 0815  Mobility Specialist Stop Time (ACUTE ONLY) 0835  Mobility Specialist Time Calculation (min) (ACUTE ONLY) 20 min    Pt received in bed, agreeable to mobility session. Son present. SV throughout. Ambulated to the BR first w/o AD, then completed hallway ambulated with rollator. No complaints. HR 94-100 throughout. Returned to room. Left in chair talking to PA with all needs met, call bell in reach.   Ileana Lute Mobility Specialist Please contact via SecureChat or Rehab office at [phone]

## 2024-05-21 NOTE — Plan of Care (Signed)

## 2024-05-21 NOTE — Progress Notes (Signed)
 PHARMACY - ANTICOAGULATION CONSULT NOTE  Pharmacy Consult for apixaban  Indication: atrial fibrillation  Allergies  Allergen Reactions   Aspirin Nausea And Vomiting   Advil  [Ibuprofen ] Palpitations   Aleve [Naproxen Sodium] Palpitations   Penicillins Rash    Patient Measurements: Height: 5' 5 (165.1 cm) Weight: 60.3 kg (133 lb) IBW/kg (Calculated) : 57 HEPARIN  DW (KG): 60.3  Vital Signs: Temp: 98.2 F (36.8 C) (11/27 0533) Temp Source: Oral (11/27 0533) BP: 131/51 (11/27 0533) Pulse Rate: 66 (11/27 0533)  Labs: Recent Labs    05/19/24 0827 05/20/24 0339 05/21/24 0235  CREATININE 0.68 0.71 0.60    Estimated Creatinine Clearance: 53.8 mL/min (by C-G formula based on SCr of 0.6 mg/dL).   Medical History: Past Medical History:  Diagnosis Date   Abdominal pain    Allergy    Anemia    Blood transfusion without reported diagnosis 1980   Cancer (HCC) 11/2011   cancer in colon   Colon cancer (HCC) 12/18/2011   DVT (deep venous thrombosis) (HCC) 05/2012   (R)IJ and subclavian, stopped Coumadin  July 2014   History of kidney stones    Irregular heart rate    since child    Medications:  Scheduled:   amiodarone   200 mg Oral BID   gabapentin   300 mg Oral BID   losartan   25 mg Oral Daily   magnesium  oxide  400 mg Oral BID   metoprolol  succinate  75 mg Oral Daily   pantoprazole   40 mg Oral Daily   potassium chloride   40 mEq Oral Once    Assessment: 36 yof who underwent VATs and now with post-op Afib. Starting on apixaban  given recurrent Afib - no AC PTA.  Last CBC on 11/24 - Hgb 10.1, plt 211. No s/sx of bleeding. Given age<80 and Scr<1.5 (weight 60.3 kg) - qualifies for apixaban  5 mg BID.  Goal of Therapy:  Monitor platelets by anticoagulation protocol: Yes   Plan:  Stop enox for DVT ppx Start apixaban  5 mg BID  Monitor CBC and for s/sx of bleeding   Thank you for allowing pharmacy to participate in this patient's care,  Suzen Sour, PharmD,  BCCCP Clinical Pharmacist  Phone: [phone] 05/21/2024 8:59 AM  Please check AMION for all [facility] Pharmacy phone numbers After 10:00 PM, call Main Pharmacy [phone]

## 2024-05-21 NOTE — Plan of Care (Signed)

## 2024-05-21 NOTE — Progress Notes (Addendum)
      [address] [place] [place] [address]             [phone]         7 Days Post-Op Procedure(s) (LRB): LOBECTOMY, LUNG, ROBOT-ASSISTED, USING VATS (Right) BLOCK, NERVE, INTERCOSTAL (Right) LYMPH NODE BIOPSY (Right)  Subjective:  Patient sitting up in chair.  Overall doing well.  Her son is at bedside and concerned about her heart rate.  Also concerned about discharge with HR being unstable.  Objective: Vital signs in last 24 hours: Temp:  [97.9 F (36.6 C)-98.4 F (36.9 C)] 98.2 F (36.8 C) (11/27 0533) Pulse Rate:  [65-68] 66 (11/27 0533) Cardiac Rhythm: Normal sinus rhythm (11/27 0700) Resp:  [18-19] 18 (11/27 0533) BP: (128-151)/(51-68) 131/51 (11/27 0533) SpO2:  [92 %-95 %] 95 % (11/27 0533)  Intake/Output from previous day: 11/26 0701 - 11/27 0700 In: 404.5 [P.O.:360; I.V.:44.5] Out: 550 [Urine:550]  General appearance: alert, cooperative, and no distress Heart: irregularly irregular rhythm Lungs: clear to auscultation bilaterally Abdomen: soft, non-tender; bowel sounds normal; no masses,  no organomegaly Extremities: extremities normal, atraumatic, no cyanosis or edema Wound: clean and dry  Lab Results: No results for input(s): WBC, HGB, HCT, PLT in the last 72 hours. BMET:  Recent Labs    05/20/24 0339 05/21/24 0235  NA 137 136  K 3.5 3.6  CL 100 99  CO2 27 28  GLUCOSE 101* 105*  BUN 10 9  CREATININE 0.71 0.60  CALCIUM  9.3 9.1    PT/INR: No results for input(s): LABPROT, INR in the last 72 hours. ABG    Component Value Date/Time   PHART 7.416 04/10/2019 0617   HCO3 27.1 04/10/2019 0617   TCO2 28 04/10/2019 0617   O2SAT 99.2 04/10/2019 0643   CBG (last 3)  No results for input(s): GLUCAP in the last 72 hours.  Assessment/Plan: S/P Procedure(s) (LRB): LOBECTOMY, LUNG, ROBOT-ASSISTED, USING VATS (Right) BLOCK, NERVE, INTERCOSTAL (Right) LYMPH NODE BIOPSY (Right)  CV- PAF, continues to have bursts  overnight and this morning- will increase Toprol  XL to 75 mg daily, continue Amiodarone  200 mg BID.[name] start Eliquis  for stroke prevention Pulm-no acute issues not requiring oxygen continue IS Renal- potassium is at 3.6 will supplement to be closer to 4 to help with A. Fib Deconditioning- home health arranged, continue PT/OT DIspo- patient stable, episodes of PAF, will titrate BB today, continue Amiodarone .. start Eliquis  for stroke prevention.. home in AM if remains stable, may benefit from ZIO patch at time of discharge  Discussed with Dr. Daniel will repeat IV Amiodarone  bolus this morning.   LOS: 7 days    Rocky Shad, PA-C 05/21/2024 8:54 AM  HR NSR 60-70s this morning after increasing metoprolol .  Supplemented K and Mg as well, started Eliquis .  Patient walked a lap this morning, stable on RA.  Continue to monitor rhythm.  Con Daniel, MD Cardiothoracic Surgery Pager: [phone]

## 2024-05-22 ENCOUNTER — Other Ambulatory Visit: Payer: Self-pay | Admitting: Surgical

## 2024-05-22 ENCOUNTER — Other Ambulatory Visit (HOSPITAL_COMMUNITY): Payer: Self-pay

## 2024-05-22 ENCOUNTER — Inpatient Hospital Stay (HOSPITAL_COMMUNITY)
Admission: RE | Admit: 2024-05-22 | Discharge: 2024-05-22 | Disposition: A | Source: Home / Self Care | Attending: Surgical | Admitting: Surgical

## 2024-05-22 DIAGNOSIS — I4891 Unspecified atrial fibrillation: Secondary | ICD-10-CM

## 2024-05-22 MED ORDER — APIXABAN 5 MG PO TABS: 5.0000 mg | ORAL_TABLET | Freq: Two times a day (BID) | ORAL | 1 refills | Status: DC

## 2024-05-22 MED ORDER — LOSARTAN POTASSIUM 25 MG PO TABS: 25.0000 mg | ORAL_TABLET | Freq: Every day | ORAL | 1 refills | Status: DC

## 2024-05-22 MED ORDER — GABAPENTIN 300 MG PO CAPS: 300.0000 mg | ORAL_CAPSULE | Freq: Every day | ORAL | 0 refills | Status: DC

## 2024-05-22 MED ORDER — AMIODARONE HCL 200 MG PO TABS: 200.0000 mg | ORAL_TABLET | Freq: Two times a day (BID) | ORAL | 1 refills | Status: DC

## 2024-05-22 MED ORDER — OXYCODONE HCL 5 MG PO TABS: 5.0000 mg | ORAL_TABLET | Freq: Four times a day (QID) | ORAL | 0 refills | Status: DC | PRN

## 2024-05-22 MED ORDER — METOPROLOL SUCCINATE ER 25 MG PO TB24: 75.0000 mg | ORAL_TABLET | Freq: Every day | ORAL | 1 refills | Status: DC

## 2024-05-22 NOTE — Progress Notes (Addendum)
 8 Days Post-Op Procedure(s) (LRB): LOBECTOMY, LUNG, ROBOT-ASSISTED, USING VATS (Right) BLOCK, NERVE, INTERCOSTAL (Right) LYMPH NODE BIOPSY (Right) Subjective: Feels well  Objective: Vital signs in last 24 hours: Temp:  [98 F (36.7 C)-98.7 F (37.1 C)] 98 F (36.7 C) (11/28 0409) Pulse Rate:  [60-80] 60 (11/28 0409) Cardiac Rhythm: Sinus bradycardia (11/28 0700) Resp:  [17-20] 20 (11/28 0409) BP: (116-153)/(61-96) 153/61 (11/28 0409) SpO2:  [93 %-96 %] 96 % (11/28 0409)  Hemodynamic parameters for last 24 hours:    Intake/Output from previous day: No intake/output data recorded. Intake/Output this shift: No intake/output data recorded.  General appearance: alert, cooperative, and no distress Heart: regular rate and rhythm Lungs: clear Abdomen: benign Extremities: no calf tenderness or edema Wound: incis healing well  Lab Results: No results for input(s): WBC, HGB, HCT, PLT in the last 72 hours. BMET:  Recent Labs    05/20/24 0339 05/21/24 0235  NA 137 136  K 3.5 3.6  CL 100 99  CO2 27 28  GLUCOSE 101* 105*  BUN 10 9  CREATININE 0.71 0.60  CALCIUM  9.3 9.1    PT/INR: No results for input(s): LABPROT, INR in the last 72 hours. ABG    Component Value Date/Time   PHART 7.416 04/10/2019 0617   HCO3 27.1 04/10/2019 0617   TCO2 28 04/10/2019 0617   O2SAT 99.2 04/10/2019 0643   CBG (last 3)  No results for input(s): GLUCAP in the last 72 hours.  Meds Scheduled Meds:  amiodarone   200 mg Oral BID   apixaban   5 mg Oral BID   gabapentin   300 mg Oral BID   losartan   25 mg Oral Daily   magnesium  oxide  400 mg Oral BID   metoprolol  succinate  75 mg Oral Daily   pantoprazole   40 mg Oral Daily   Continuous Infusions: PRN Meds:.ondansetron  (ZOFRAN ) IV, oxyCODONE , traMADol   Xrays DG Chest Port 1 View Result Date: 05/21/2024 CLINICAL DATA:  Follow-up pneumothorax. EXAM: PORTABLE CHEST 1 VIEW COMPARISON:  05/17/2024. FINDINGS: No significant  change in the right apical pneumothorax compared to the prior exam. Surgical anastomosis staples are also stable along the partly collapsed right upper lung. Stable left upper lobe nodule. Lungs otherwise clear. No left pneumothorax. No pleural effusion. Cardiac silhouette normal in size.  No mediastinal or hilar masses. IMPRESSION: 1. No change from the most recent prior chest radiograph. 2. Persistent small right apical pneumothorax. Electronically Signed   By: Alm Parkins M.D.   On: 05/21/2024 10:39    Assessment/Plan: S/P Procedure(s) (LRB): LOBECTOMY, LUNG, ROBOT-ASSISTED, USING VATS (Right) BLOCK, NERVE, INTERCOSTAL (Right) LYMPH NODE BIOPSY (Right) POD#8  1 afeb, s BP 110's-150's, losartan  for HTN,  sinus rhythm/sinus brady- om amio po, eliquis  and toprol  for afib 2 O2 sats good on RA 3 voiding- amts not measured 4 no new labs or XRAY's 5 will arrange for Zio patch 6  arrange for walker- has been previously ordered 7 appears stable for D/C - reviewed instructions     LOS: 8 days    Lemond FORBES Cera PA--C  pager [phone] 05/22/2024  Looking good, remains in normal sinus rhythm.  Hemodynamics stable and saturating well on room air.  Still having trouble with some foods like hamburger meat and corn, but able to adjust diet as needed.  Planning for Ziopatch and discharge home today.  Con Clunes, MD Cardiothoracic Surgery Pager: [phone]

## 2024-05-22 NOTE — Progress Notes (Signed)
 Went over AVS with patient curvin, answered any/all questions, IV removed, belongings gathered, patient finishing up her lunch and then will be wheeled out to sons vehicle.
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
## 2024-05-22 NOTE — Plan of Care (Signed)

## 2024-05-22 NOTE — Progress Notes (Addendum)
 05/22/2024 8:09 AM [facility]  EXPEDITER PROGRESS NOTE  Patient Name: [name]  DOB:[birth year] Date of Admission: 05/14/2024  Date of Assessment:05/22/24   -------------------------------------------------------------------------------------------------------------------   Brief clinical summary: s/p Right VATS with lobectomy and lymph node biopsy 05/14/2024  Labs, test, and orders reviewed: yes  30-day Readmission: No  Discharge order: Yes  Current discharge plan: Home with family support. [employer] to deliver DME. [facility] PT/OT/aide arranged. Zio patch at discharge.  If anticipated for next day discharge, barriers to discharge before 11am identified:  No immediate barriers identified at this time.    Intervention provided by [facility] team:  message sent to primary RN to confirm receipt of DME and Zio patch prior to discharge. Awaiting response.   Barrier resolved: not applicable    -------------------------------------------------------------------------------------------------------------------  [employer] RN Expediter, Tniyah Nakagawa, Corean Lobstein Please contact us  directly via secure chat (search for [facility]) or by calling us  at [phone] [facility]).

## 2024-05-22 NOTE — Care Management Important Message (Signed)
 Important Message  Patient Details  Name: [name] MRN: [id number] Date of Birth: March 17, 1946   Important Message Given:  Yes - Medicare IM     [name] 05/22/2024, 11:46 AM

## 2024-05-22 NOTE — TOC Transition Note (Signed)
 Transition of Care [facility]) - Discharge Note   Patient Details  Name: [name] MRN: [id number] Date of Birth: December 31, 1945  Transition of Care [facility]) CM/SW Contact:  Andrez JULIANNA George, RN Phone Number: 05/22/2024, 11:09 AM   Clinical Narrative:     Pt is discharging home with home health services through [facility]. [facility] aware of d/c home today. Pt requesting female aide for home. Cory with [facility] is aware.  Rollator and BSC to be delivered to the room per [employer]. CM provided pt with 30 day free card for her Eliquis .  Pts son to provide transport home and supervision at home.  Final next level of care: Home w Home Health Services Barriers to Discharge: No Barriers Identified   Patient Goals and CMS Choice Patient states their goals for this hospitalization and ongoing recovery are:: return home CMS Medicare.gov Compare Post Acute Care list provided to:: Patient Choice offered to / list presented to : Patient      Discharge Placement                       Discharge Plan and Services Additional resources added to the After Visit Summary for     Discharge Planning Services: CM Consult Post Acute Care Choice: Home Health, Durable Medical Equipment          DME Arranged: Walker rolling with seat, Bedside commode DME Agency: [facility] Date DME Agency Contacted: 05/22/24 Time DME Agency Contacted: [phone] Representative spoke with at DME Agency: Zack HH Arranged: PT, OT HH Agency: [facility] Date [facility] Agency Contacted: 05/18/24 Time HH Agency Contacted: [phone] Representative spoke with at [facility] Agency: Darleene  Social Drivers of Health (SDOH) Interventions SDOH Screenings   Food Insecurity: No Food Insecurity (05/14/2024)  Housing: Low Risk  (05/14/2024)  Transportation Needs: No Transportation Needs (05/14/2024)  Utilities: Not At Risk (05/14/2024)  Alcohol Screen: Low Risk  (08/07/2023)  Depression (PHQ2-9): Low Risk  (04/28/2024)  Financial Resource  Strain: Low Risk  (09/08/2023)  Recent Concern: Financial Resource Strain - Medium Risk (08/07/2023)  Physical Activity: Sufficiently Active (09/08/2023)  Recent Concern: Physical Activity - Insufficiently Active (08/07/2023)  Social Connections: Moderately Isolated (05/15/2024)  Stress: No Stress Concern Present (09/08/2023)  Tobacco Use: High Risk (05/14/2024)  Health Literacy: Adequate Health Literacy (08/07/2023)     Readmission Risk Interventions     No data to display

## 2024-05-25 ENCOUNTER — Ambulatory Visit: Attending: Surgical

## 2024-05-25 ENCOUNTER — Telehealth (HOSPITAL_BASED_OUTPATIENT_CLINIC_OR_DEPARTMENT_OTHER): Payer: Self-pay | Admitting: *Deleted

## 2024-05-25 ENCOUNTER — Telehealth: Payer: Self-pay | Admitting: *Deleted

## 2024-05-25 ENCOUNTER — Telehealth (HOSPITAL_BASED_OUTPATIENT_CLINIC_OR_DEPARTMENT_OTHER): Payer: Self-pay | Admitting: Cardiology

## 2024-05-25 DIAGNOSIS — I4891 Unspecified atrial fibrillation: Secondary | ICD-10-CM

## 2024-05-25 NOTE — Progress Notes (Unsigned)
 Enrolled patient for a 14 day Zio XT monitor to be mailed to patients home  B. Christopher to read

## 2024-05-25 NOTE — Telephone Encounter (Signed)
 Routing to Badin as an FYI about the delay in PT start.

## 2024-05-25 NOTE — Telephone Encounter (Signed)
 Has concerns about the cardiac meds that were sent in on hospital discharge and called to ask for phone # for Dr. Lonni. The number was provided.

## 2024-05-25 NOTE — Telephone Encounter (Signed)
 Seen by Dr. Lonni while admitted for postoperative atrial fib. Recommended for 2 week ZIO - this was entered today and will likely be delivered to her home in 2-3 days.   Medications as follows: Continue Metoprolol  succinate 75mg  (three 25mg  tablets) daily. This was sent in for incorrect # of tablets. Please send Rx for Metoprolol  tartrate 25mg  three tablets (75mg  dose) daily. Will need 90 tablets for 1 month supply. Future refills at [facility]. Per Dr. Eston note: Amiodarone  200mg  BID x 14 days, on 06/04/24 change to Amiodarone  200mg  ONCE per day Continue Eliquis  5mg  BID.     [place] S Nayson Traweek, NP

## 2024-05-25 NOTE — Telephone Encounter (Signed)
 I called and spoke patient and her son. Son said she only took the 75 mg of Toprol  XL Thursday, Friday morning and then was discharged.  They noted her BP was low during that time and she is not usually low and does not usually take a lot of medicine.  They are hoping she can stay on 25 mg. Toprol  XL 25 mg daily is the dose she took Sat, Sun and today.  We discussed post op afib and the perioperative period.  She will increase to 50 mg daily, starting today.  Will take 50 mg tue and Wed and will check BP 3-4 hrs after taking.   BP check during phone call was 183/84.  I know that's not right.  I asked her to sit still quietly for 10-20 min and take the second tablet now and then recheck the BP.  She understands the amiodarone  and eliquis  instructions.  She knows to decrease amiodarone  to one tablet on 06/04/24.  They do not need a new prescription as their current script says 75 mg take 3 tablets, and has enough for now.    Has follow up with APP on 06/05/24.  Pt grateful for assistance.

## 2024-05-25 NOTE — Telephone Encounter (Signed)
 Copied from CRM [id number]. Topic: Clinical - Home Health Verbal Orders >> May 25, 2024 11:04 AM Selinda RAMAN wrote: Caller/Agency: Jasmine with [facility] Number: [phone] Service Requested: Physical Therapy  Any new concerns about the patient? Yes there has been a delay in the start of care for the patient for physical therapy as she was supposed to start on Saturday November 29th but she wanted to wait until Wednesday December 3rd so she could see a female.

## 2024-05-25 NOTE — Telephone Encounter (Signed)
  Pt c/o medication issue:  1. Name of Medication:   metoprolol  succinate (TOPROL -XL) 25 MG 24 hr tablet    2. How are you currently taking this medication (dosage and times per day)? Take 3 tablets (75 mg total) by mouth daily.   3. Are you having a reaction (difficulty breathing--STAT)? No   4. What is your medication issue? Patient's son Lorence calling to ask what they need to do with this medication. When the patient went home last Friday the patient been taking metoprolol  1 tablet a day instead of 3 tablet a day, he wants to ask what action they need to do to start the right dosage. Also, they have a question about the patient's heart monitor. He said to call him first and then he can do a three way call with the patient

## 2024-05-27 ENCOUNTER — Other Ambulatory Visit: Payer: Self-pay

## 2024-05-28 ENCOUNTER — Other Ambulatory Visit: Payer: Self-pay

## 2024-05-28 NOTE — Progress Notes (Signed)
 The proposed treatment discussed in conference is for discussion purpose only and is not a binding recommendation.  The patients have not been physically examined, or presented with their treatment options.  Therefore, final treatment plans cannot be decided.

## 2024-06-01 ENCOUNTER — Other Ambulatory Visit: Payer: Self-pay | Admitting: Thoracic Surgery (Cardiothoracic Vascular Surgery)

## 2024-06-01 DIAGNOSIS — R918 Other nonspecific abnormal finding of lung field: Secondary | ICD-10-CM

## 2024-06-02 ENCOUNTER — Ambulatory Visit: Payer: Self-pay | Admitting: Thoracic Surgery (Cardiothoracic Vascular Surgery)

## 2024-06-02 ENCOUNTER — Inpatient Hospital Stay: Admission: RE | Admit: 2024-06-02 | Discharge: 2024-06-02 | Attending: Cardiology | Admitting: Cardiology

## 2024-06-02 VITALS — BP 149/73 | HR 61 | Resp 18 | Ht 65.0 in | Wt 135.0 lb

## 2024-06-02 DIAGNOSIS — R918 Other nonspecific abnormal finding of lung field: Secondary | ICD-10-CM

## 2024-06-02 DIAGNOSIS — C7A1 Malignant poorly differentiated neuroendocrine tumors: Secondary | ICD-10-CM | POA: Diagnosis not present

## 2024-06-02 DIAGNOSIS — Z09 Encounter for follow-up examination after completed treatment for conditions other than malignant neoplasm: Secondary | ICD-10-CM

## 2024-06-02 MED ORDER — METOPROLOL SUCCINATE ER 25 MG PO TB24: 50.0000 mg | ORAL_TABLET | Freq: Every day | ORAL | 3 refills | Status: AC
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
## 2024-06-04 ENCOUNTER — Ambulatory Visit: Admitting: Thoracic Surgery (Cardiothoracic Vascular Surgery)

## 2024-06-05 ENCOUNTER — Ambulatory Visit (HOSPITAL_BASED_OUTPATIENT_CLINIC_OR_DEPARTMENT_OTHER): Admitting: Family

## 2024-06-05 ENCOUNTER — Encounter (HOSPITAL_BASED_OUTPATIENT_CLINIC_OR_DEPARTMENT_OTHER): Payer: Self-pay | Admitting: Family

## 2024-06-05 VITALS — BP 132/64 | HR 58 | Ht 65.0 in | Wt 134.6 lb

## 2024-06-05 DIAGNOSIS — I4891 Unspecified atrial fibrillation: Secondary | ICD-10-CM | POA: Diagnosis not present

## 2024-06-05 DIAGNOSIS — I10 Essential (primary) hypertension: Secondary | ICD-10-CM

## 2024-06-05 DIAGNOSIS — I9789 Other postprocedural complications and disorders of the circulatory system, not elsewhere classified: Secondary | ICD-10-CM | POA: Diagnosis not present

## 2024-06-05 DIAGNOSIS — D6859 Other primary thrombophilia: Secondary | ICD-10-CM

## 2024-06-05 DIAGNOSIS — Z79899 Other long term (current) drug therapy: Secondary | ICD-10-CM | POA: Diagnosis not present

## 2024-06-05 MED ORDER — AMIODARONE HCL 200 MG PO TABS: 200.0000 mg | ORAL_TABLET | Freq: Every day | ORAL | 2 refills | Status: AC

## 2024-06-05 MED ORDER — APIXABAN 5 MG PO TABS: 5.0000 mg | ORAL_TABLET | Freq: Two times a day (BID) | ORAL | 5 refills | Status: DC

## 2024-06-05 MED ORDER — LOSARTAN POTASSIUM 25 MG PO TABS: 25.0000 mg | ORAL_TABLET | Freq: Every day | ORAL | 5 refills | Status: AC

## 2024-06-05 NOTE — Patient Instructions (Addendum)
 Medication Instructions:   REDUCE Amiodarone  to ONE tablet  once daily.   On 08/22/24 you can stop Amiodarone .   We will determine your Metoprolol  dosing based on the results of your heart monitor. Continue Metoprolol  50mg  daily until we contact you regarding your heart monitor results. This may take 1-2 weeks after you return the monitor.  *If you need a refill on your cardiac medications before your next appointment, please call your pharmacy*  Follow-Up: At [facility] you and your health needs are our priority.  As part of our continuing mission to provide you with exceptional heart care, our providers are all part of one team.  This team includes your primary Cardiologist (physician) and Advanced Practice Providers or APPs (Physician Assistants and Nurse Practitioners) who all work together to provide you with the care you need, when you need it.  Your next appointment:   2-3 month(s)  Provider:   Shelda Bruckner, MD    We recommend signing up for the patient portal called MyChart.  Sign up information is provided on this After Visit Summary.  MyChart is used to connect with patients for Virtual Visits (Telemedicine).  Patients are able to view lab/test results, encounter notes, upcoming appointments, etc.  Non-urgent messages can be sent to your provider as well.   To learn more about what you can do with MyChart, go to [email or web address].

## 2024-06-05 NOTE — Progress Notes (Signed)
 Cardiology Office Note   Date:  06/05/2024  ID:  [name], DOB 1945/08/17, MRN [id number] PCP: de [place], Raymond J, MD  [facility] Providers Cardiologist:  Shelda Bruckner, MD     History of Present Illness [name] is a 78 y.o. 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  Postoperative course notable for elevated blood pressure, ARB initiated.  SVT noted and beta-blocker initiated.  She developed A-fib with RVR and was started on IV amiodarone . Her AFib was symptomatic.  She converted to NSR and transition to p.o. amiodarone .  She continued to brief episodes of A-fib Eliquis  initiated and Lopressor  increased.  Discharged with Zio patch.  She saw Dr. Kerrin 06/02/2024.  She is noted be taking metoprolol  50 mg daily that was prescribed 75 mg at discharge.  He was doing overall well and given permission to gradually reduce activities.  She was recommended for TCTS PA follow-up in 6 weeks.  Presents today for follow up with her son. Reports feeling overall well since hospital discharge. Reports no palpitations. Reports BP at home when checked on upper arm Omron cugg has been good but is routinely higher in the doctor's office. Home readings 146, 138, 121. Over the last day or two her BP has been 120-130s. Reports no shortness of breath nor dyspnea on exertion. Reports no chest pain, pressure, or tightness. No edema, orthopnea, PND.  Minimal incisional pain post VATS.   ROS: Please see the history of present illness.    All other systems reviewed and are negative.   Studies Reviewed EKG  Interpretation Date/Time:  Friday June 05 2024 10:44:52 EST Ventricular Rate:  57 PR Interval:  134 QRS Duration:  90 QT Interval:  402 QTC Calculation: 391 R Axis:   23  Text Interpretation: Sinus bradycardia Nonspecific T wave abnormality Confirmed by Vannie Mora ([id number]) on 06/05/2024 11:09:21 AM    Cardiac Studies & Procedures   ______________________________________________________________________________________________     ECHOCARDIOGRAM  ECHOCARDIOGRAM COMPLETE 05/19/2024  Narrative ECHOCARDIOGRAM REPORT    Patient Name:   [name] Date of Exam: 05/19/2024 Medical Rec #:  [id number]       Height:       65.0 in Accession #:    [id number]      Weight:       133.0 lb Date of Birth:  1946/01/27       BSA:          1.663 m Patient Age:    78 years        BP:           124/62 mmHg Patient Gender: F               HR:           72 bpm. Exam Location:  Inpatient  Procedure: 2D Echo (Both Spectral and Color Flow Doppler were utilized during procedure).  Indications:    atrial fibrillation  History:        Patient has no prior history of Echocardiogram examinations. Cancer; Risk Factors:Hypertension and Current Smoker.  Sonographer:    Tinnie Barefoot RDCS Referring Phys: [id number] [name]  IMPRESSIONS   1. Left ventricular ejection fraction, by estimation, is 50 to 55%. The left ventricle has low normal function. The left ventricle has no regional wall motion abnormalities. There is mild concentric left ventricular hypertrophy. Left ventricular diastolic parameters are consistent with Grade I diastolic dysfunction (impaired relaxation). 2. Right ventricular systolic function is normal. The right ventricular size is normal. There is normal pulmonary artery systolic pressure. The estimated right ventricular systolic pressure is 25.3 mmHg. 3. The mitral valve is grossly normal. Trivial mitral valve regurgitation. No evidence of mitral stenosis. 4.  The aortic valve is grossly normal. Aortic valve regurgitation is not visualized. No aortic stenosis is present. 5. The inferior vena cava is normal in size with greater than 50% respiratory variability, suggesting right atrial pressure of 3 mmHg.  FINDINGS Left Ventricle: Left ventricular ejection fraction, by estimation, is 50 to 55%. The left ventricle has low normal function. The left ventricle has no regional wall motion abnormalities. The left ventricular internal cavity size was normal in size. There is mild concentric left ventricular hypertrophy. Left ventricular diastolic parameters are consistent with Grade I diastolic dysfunction (impaired relaxation).  Right Ventricle: The right ventricular size is normal. No increase in right ventricular wall thickness. Right ventricular systolic function is normal. There is normal pulmonary artery systolic pressure. The tricuspid regurgitant velocity is 2.36 m/s, and with an assumed right atrial pressure of 3 mmHg, the estimated right ventricular systolic pressure is 25.3 mmHg.  Left Atrium: Left atrial size was normal in size.  Right Atrium: Right atrial size was normal in size.  Pericardium: There is no evidence of pericardial effusion.  Mitral Valve: The mitral valve is grossly normal. Trivial mitral valve regurgitation. No evidence of mitral valve stenosis.  Tricuspid Valve: The tricuspid valve is grossly normal. Tricuspid valve regurgitation is trivial. No evidence of tricuspid stenosis.  Aortic Valve: The aortic valve is grossly normal. Aortic valve regurgitation is not visualized. No aortic stenosis is present.  Pulmonic Valve: The pulmonic valve was grossly normal. Pulmonic valve regurgitation is not visualized. No evidence of pulmonic stenosis.  Aorta: The aortic root is normal in size and structure.  Venous: The inferior vena cava is normal in size with greater than 50% respiratory variability, suggesting right atrial pressure of 3  mmHg.  IAS/Shunts: The atrial septum is grossly normal.   LEFT VENTRICLE PLAX 2D LVIDd:         4.30 cm     Diastology LVIDs:         3.20 cm     LV e' medial:    5.00 cm/s LV PW:         0.90 cm     LV E/e' medial:  12.2 LV IVS:        1.10 cm     LV e' lateral:   5.66 cm/s LVOT diam:     2.10 cm     LV E/e' lateral: 10.8 LV SV:         55 LV SV Index:   33 LVOT Area:     3.46 cm  LV Volumes (MOD) LV vol d, MOD A4C: 55.0 ml LV vol s, MOD A4C: 27.4 ml LV SV MOD A4C:     55.0 ml  RIGHT VENTRICLE            IVC RV Basal diam:  2.40 cm    IVC diam: 1.30 cm RV S prime:     9.90 cm/s TAPSE (M-mode): 1.8 cm  LEFT ATRIUM             Index        RIGHT ATRIUM          Index LA diam:        2.90 cm 1.74 cm/m   RA Area:     9.88 cm LA Vol (A2C):   41.5 ml 24.95 ml/m  RA Volume:   19.70 ml 11.84 ml/m LA Vol (A4C):   28.2 ml 16.95 ml/m LA Biplane Vol: 36.2 ml 21.76 ml/m AORTIC VALVE LVOT Vmax:   87.30 cm/s LVOT Vmean:  53.400 cm/s LVOT VTI:    0.159 m  AORTA Ao Root diam: 3.20 cm  MITRAL VALVE               TRICUSPID VALVE MV Area (PHT): 2.48 cm    TR Peak grad:   22.3 mmHg MV Decel Time: 306 msec    TR Vmax:        236.00 cm/s MV E velocity: 60.90 cm/s MV A velocity: 89.30 cm/s  SHUNTS MV E/A ratio:  0.68        Systemic VTI:  0.16 m Systemic Diam: 2.10 cm  Darryle Decent MD Electronically signed by Darryle Decent MD Signature Date/Time: 05/19/2024/3:00:27 PM    Final          ______________________________________________________________________________________________      Risk Assessment/Calculations  CHA2DS2-VASc Score = 4   This indicates a 4.8% annual risk of stroke. The patient's score is based upon: CHF History: 0 HTN History: 1 Diabetes History: 0 Stroke History: 0 Vascular Disease History: 0 Age Score: 2 Gender Score: 1            Physical Exam VS:  BP 132/64   Pulse (!) 58   Ht 5' 5 (1.651 m)   Wt 134 lb 9.6 oz (61.1 kg)   SpO2  97%   BMI 22.40 kg/m   Vitals:   06/05/24 1048 06/05/24 1129  BP: (!) 170/74 132/64  Pulse: (!) 58   Height: 5' 5 (1.651 m)   Weight: 134 lb 9.6 oz (61.1 kg)   SpO2: 97%   BMI (Calculated): 22.4          Wt Readings from Last 3 Encounters:  06/05/24 134 lb 9.6 oz (61.1 kg)  06/02/24 135 lb (61.2 kg)  05/14/24 133 lb (60.3 kg)    GEN: Well nourished, well developed in no acute distress NECK: No JVD; No carotid bruits CARDIAC: bradycardic, RRR, no murmurs, rubs, gallops RESPIRATORY:  Clear to auscultation without rales, wheezing or rhonchi  ABDOMEN: Soft, non-tender, non-distended EXTREMITIES:  No edema; No deformity   ASSESSMENT AND PLAN  HTN - Reports BP at home well controlled. Reports element of white coat hypertension. Improved on repeat BP check. Continue Losartan  25mg  daily. Refill provided. Discussed to monitor BP at home at least 2 hours after medications and sitting for 5-10 minutes.   Sinus bradycardia - Consider reducing Metoprolol  based on her ZIO monitor results, mailed back today. No lightheadedness, dizziness.   Postoperative atrial fibrillation / SVT / Hypercoagulable state / On Amiodarone  therapy - discussed postoperative atrial fib. She would like to be off all medications if possible. Her afib while hospitalized was symptomatic with sensation of feeling her heart beat out of her chest, no recurrence. EKG today SB.  Reduce amiodarone  from 200mg  BID to daily. Stop Amiodarone  3 mos post VATS on 08/22/24. As stopping after 3 mos of therapy will defer TSH/ALT. Determine  Metoprolol  dosing based on ZIO, mailed back today. CHA2DS2-VASc Score = 4 [CHF History: 0, HTN History: 1, Diabetes History: 0, Stroke History: 0, Vascular Disease History: 0, Age Score: 2, Gender Score: 1].  Therefore, the patient's annual risk of stroke is 4.8 %.    Continue Eliquis  5mg  BID. Consider discussion of need for long term OAC at follow up.        Dispo: follow up in 2-3 montns with Dr.  Lonni  Signed, Reche GORMAN Finder, NP

## 2024-06-08 ENCOUNTER — Encounter: Payer: Self-pay | Admitting: Nurse Practitioner

## 2024-06-08 ENCOUNTER — Inpatient Hospital Stay: Admitting: Oncology

## 2024-06-08 ENCOUNTER — Inpatient Hospital Stay: Attending: Oncology | Admitting: Nurse Practitioner

## 2024-06-08 VITALS — BP 150/73 | HR 60 | Temp 98.1°F | Resp 18 | Ht 65.0 in | Wt 134.2 lb

## 2024-06-08 DIAGNOSIS — C7801 Secondary malignant neoplasm of right lung: Secondary | ICD-10-CM | POA: Insufficient documentation

## 2024-06-08 DIAGNOSIS — R918 Other nonspecific abnormal finding of lung field: Secondary | ICD-10-CM

## 2024-06-08 DIAGNOSIS — C185 Malignant neoplasm of splenic flexure: Secondary | ICD-10-CM | POA: Diagnosis not present

## 2024-06-08 DIAGNOSIS — D709 Neutropenia, unspecified: Secondary | ICD-10-CM | POA: Insufficient documentation

## 2024-06-08 DIAGNOSIS — T451X5A Adverse effect of antineoplastic and immunosuppressive drugs, initial encounter: Secondary | ICD-10-CM | POA: Insufficient documentation

## 2024-06-08 DIAGNOSIS — Z9221 Personal history of antineoplastic chemotherapy: Secondary | ICD-10-CM | POA: Diagnosis not present

## 2024-06-08 DIAGNOSIS — Z87891 Personal history of nicotine dependence: Secondary | ICD-10-CM | POA: Insufficient documentation

## 2024-06-08 DIAGNOSIS — I700 Atherosclerosis of aorta: Secondary | ICD-10-CM | POA: Diagnosis not present

## 2024-06-08 DIAGNOSIS — R0789 Other chest pain: Secondary | ICD-10-CM | POA: Diagnosis not present

## 2024-06-08 DIAGNOSIS — C189 Malignant neoplasm of colon, unspecified: Secondary | ICD-10-CM | POA: Diagnosis not present

## 2024-06-08 DIAGNOSIS — G620 Drug-induced polyneuropathy: Secondary | ICD-10-CM | POA: Insufficient documentation

## 2024-06-08 DIAGNOSIS — I4891 Unspecified atrial fibrillation: Secondary | ICD-10-CM | POA: Insufficient documentation

## 2024-06-08 DIAGNOSIS — Z86718 Personal history of other venous thrombosis and embolism: Secondary | ICD-10-CM | POA: Insufficient documentation

## 2024-06-08 DIAGNOSIS — Z7901 Long term (current) use of anticoagulants: Secondary | ICD-10-CM | POA: Diagnosis not present

## 2024-06-08 DIAGNOSIS — Z23 Encounter for immunization: Secondary | ICD-10-CM | POA: Diagnosis not present

## 2024-06-08 DIAGNOSIS — E7800 Pure hypercholesterolemia, unspecified: Secondary | ICD-10-CM | POA: Diagnosis not present

## 2024-06-08 DIAGNOSIS — I10 Essential (primary) hypertension: Secondary | ICD-10-CM | POA: Diagnosis not present

## 2024-06-08 DIAGNOSIS — M542 Cervicalgia: Secondary | ICD-10-CM | POA: Diagnosis not present

## 2024-06-08 DIAGNOSIS — D630 Anemia in neoplastic disease: Secondary | ICD-10-CM | POA: Diagnosis not present

## 2024-06-08 DIAGNOSIS — I2510 Atherosclerotic heart disease of native coronary artery without angina pectoris: Secondary | ICD-10-CM | POA: Diagnosis not present

## 2024-06-08 DIAGNOSIS — C7B8 Other secondary neuroendocrine tumors: Secondary | ICD-10-CM | POA: Diagnosis not present

## 2024-06-08 DIAGNOSIS — Z483 Aftercare following surgery for neoplasm: Secondary | ICD-10-CM | POA: Diagnosis not present

## 2024-06-08 NOTE — Progress Notes (Signed)
 [facility] OFFICE PROGRESS NOTE   Diagnosis:    INTERVAL HISTORY:   [name] returns as scheduled.  She feels well.  She has a good appetite.  She denies shortness of breath.  Occasional right anterior chest pain.  No bowel or bladder issues.  Objective:  Vital signs in last 24 hours:  Blood pressure (!) 150/73, pulse 60, temperature 98.1 F (36.7 C), temperature source Temporal, resp. rate 18, height 5' 5 (1.651 m), weight 134 lb 3.2 oz (60.9 kg), SpO2 100%.     Resp: Lungs clear bilaterally.  Healed surgical incisions right posterior chest. Cardio: Regular rate and rhythm. GI: No hepatosplenomegaly.  Nontender. Vascular: No leg edema.   Lab Results:  Lab Results  Component Value Date   WBC 6.8 05/18/2024   HGB 10.1 (L) 05/18/2024   HCT 30.8 (L) 05/18/2024   MCV 90.3 05/18/2024   PLT 211 05/18/2024   NEUTROABS 2.2 02/28/2024    Imaging:  No results found.  Medications: I have reviewed the patient's current medications.  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 Adjuvant FOLFOX chemotherapy was initiated on 01/21/2012. The last cycle was completed on 06/30/2012. Restaging CT scans 09/30/2012 showed no evidence of recurrent/metastatic disease. -Surveillance colonoscopy 02/24/2013 with a single hyperplastic polyp. Next colonoscopy recommended at a 3 year interval.  -Surveillance CT scans 10/12/2013 with no evidence of metastatic disease   -Surveillance CT scans 10/14/2014 with no evidence of metastatic disease -Negative surveillance colonoscopy 03/30/2016  -Negative surveillance colonoscopy 02/28/2021 -Negative colonoscopy 08/15/2021 -CT chest 08/17/2023: 3.7 cm right upper lobe mass, 10 mm left upper lobe nodule -CTs 03/07/2024-enlargement of spiculated subpleural mass peripheral right upper lobe measuring 4.7 x 2.9 cm previously 3.7 x 2.5 cm.  Newly  enlarged right hilar lymph node.  Unchanged left upper lobe nodule.  Multiple additional tiny nodules scattered in the apices unchanged.  No evidence of lymphadenopathy or metastatic disease in the abdomen or pelvis. -PET scan 03/20/2024-right upper lobe pulmonary mass is intensely hypermetabolic.  No other evidence of metastatic disease.  Specifically no hypermetabolic right hilar adenopathy.  Other scattered small pulmonary nodules are stable and without hypermetabolic activity although are suboptimally evaluated based on size. -04/01/2024 biopsy right lung mass-adenocarcinoma with morphologic features typical of metastatic colonic adenocarcinoma; IHC stains show cytokeratin 20 positive, CDX2/TTF-1/Napsin A negative.  MMR with preserved expression; PD-L1 TPS 0%; foundation 1-microsatellite stable, tumor mutation burden 16, KRAS/NRAS wild-type, FGFR1 amplification, RB1 loss -04/08/2024 tumor board discussion-surgical consult for resection of right lung mass, radiation not recommended due to size; continue to monitor left lung lesion -Appointment with Dr. Kerrin 05/05/2024 Delayed nausea following cycle 2 FOLFOX. Aloxi  was added with cycle 3. Persistent delayed nausea. Emend was added with cycle 4. She noted improvement in the nausea following cycle 4. She declined steroid prophylaxis.   Tiny lung nodules seen on a CT of the chest 10/19/2011. Stable on CT of the chest 10/12/2013 and 10/14/2014 History of tobacco use Endometrial thickening on CT of the abdomen 10/02/2011. Progressive endometrial thickening on CT 09/30/2012. Status post removal of an endometrial polyp 02/03/2013 with benign pathology. Port-A-Cath placement 01/09/2012 in interventional radiology. Removed 12/30/2012. 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 The finger numbness has improved.   Mild thrombocytopenia secondary to chemotherapy-resolved. History of neutropenia secondary to chemotherapy. Acute DVT right  internal jugular and subclavian veins 06/16/2012. 08/17/2023 CT chest-3.7 cm right upper lobe mass and 10 mm left  upper lobe nodule CTs 03/07/2024-interval enlargement of a spiculated subpleural mass peripheral right upper lobe measuring 4.7 x 2.9 cm previously 3.7 x 2.5 cm.  Newly enlarged right hilar node consistent with nodal metastatic disease.  Unchanged rounded left upper lobe nodule measuring 1.0 x 0.8 cm.  Multiple additional tiny nodules scattered in the apices unchanged.  No evidence of lymphadenopathy or metastatic disease in the abdomen or pelvis. PET scan 03/20/2024-enlarging right upper lobe pulmonary mass intensely hypermetabolic.  No other evidence of metastatic disease.  Specifically no hypermetabolic right hilar adenopathy.  Other scattered small pulmonary nodules are stable and without hypermetabolic activity. 04/01/2024 biopsy right lung mass-adenocarcinoma with morphologic features typical of metastatic colonic adenocarcinoma; IHC stains show cytokeratin 20 positive, CDX2/TTF-1/Napsin A negative. MMR with preserved expression. Referred to Dr. 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 Postoperative atrial fibrillation November 2025  Disposition: [name] has been diagnosed with stage II non-small cell lung cancer.  [name] reviewed the diagnosis, prognosis and treatment options with her and her son at today's visit.  They understand Dr.  Andriette recommendation for adjuvant chemotherapy.  We specifically discussed Alimta/cisplatin every 3 weeks x 4 cycles followed by immunotherapy.  She was provided with printed information on Alimta and cisplatin.  She would like to consider the information given to her today before making a decision.  She will return for follow-up and further discussion on 06/17/2024.  We are available to see her sooner if needed.  Patient seen with [name].  Olam Ned ANP/GNP-BC   06/08/2024  1:49 PM  This was a shared visit with Olam Ned.  [name] was diagnosed with non-small cell lung cancer on the lung resection specimen.  She has been diagnosed with pathologic stage II disease.  There is a significant chance of developing recurrent lung cancer over the next several years.  We discussed the prognosis and adjuvant treatment options.  I recommend adjuvant chemotherapy followed by immunotherapy.  She was given written materials on Alimta/cisplatin and atezolizumab.  She would like time to think about proceeding with adjuvant therapy.  She will return for an office visit and further discussion next week.  Her case was presented at the GI tumor conference on 05/27/2024.  Review of the surgical pathology is consistent with non-small cell lung cancer.  Adjuvant therapy was recommended.  Arvella Cloretta, MD

## 2024-06-09 ENCOUNTER — Ambulatory Visit: Admitting: Thoracic Surgery (Cardiothoracic Vascular Surgery)

## 2024-06-16 NOTE — Addendum Note (Signed)
 Encounter addended by: Zailen Albarran N on: 06/16/2024 8:51 AM  Actions taken: Imaging Exam ended

## 2024-06-17 ENCOUNTER — Inpatient Hospital Stay: Admitting: Oncology

## 2024-06-17 ENCOUNTER — Inpatient Hospital Stay

## 2024-06-17 VITALS — BP 134/61 | HR 58 | Temp 97.9°F | Resp 14 | Wt 133.7 lb

## 2024-06-17 DIAGNOSIS — C7A1 Malignant poorly differentiated neuroendocrine tumors: Secondary | ICD-10-CM

## 2024-06-17 DIAGNOSIS — Z23 Encounter for immunization: Secondary | ICD-10-CM

## 2024-06-17 DIAGNOSIS — C7801 Secondary malignant neoplasm of right lung: Secondary | ICD-10-CM | POA: Diagnosis not present

## 2024-06-17 LAB — CBC WITH DIFFERENTIAL (CANCER CENTER ONLY)
Abs Immature Granulocytes: 0.01 K/uL (ref 0.00–0.07)
Basophils Absolute: 0 K/uL (ref 0.0–0.1)
Basophils Relative: 1 %
Eosinophils Absolute: 0.3 K/uL (ref 0.0–0.5)
Eosinophils Relative: 4 %
HCT: 34.3 % — ABNORMAL LOW (ref 36.0–46.0)
Hemoglobin: 11.1 g/dL — ABNORMAL LOW (ref 12.0–15.0)
Immature Granulocytes: 0 %
Lymphocytes Relative: 41 %
Lymphs Abs: 2.7 K/uL (ref 0.7–4.0)
MCH: 29.4 pg (ref 26.0–34.0)
MCHC: 32.4 g/dL (ref 30.0–36.0)
MCV: 91 fL (ref 80.0–100.0)
Monocytes Absolute: 0.6 K/uL (ref 0.1–1.0)
Monocytes Relative: 9 %
Neutro Abs: 3 K/uL (ref 1.7–7.7)
Neutrophils Relative %: 45 %
Platelet Count: 220 K/uL (ref 150–400)
RBC: 3.77 MIL/uL — ABNORMAL LOW (ref 3.87–5.11)
RDW: 13.1 % (ref 11.5–15.5)
WBC Count: 6.6 K/uL (ref 4.0–10.5)
nRBC: 0 % (ref 0.0–0.2)

## 2024-06-17 LAB — CMP (CANCER CENTER ONLY)
ALT: 13 U/L (ref 0–44)
AST: 22 U/L (ref 15–41)
Albumin: 4.2 g/dL (ref 3.5–5.0)
Alkaline Phosphatase: 114 U/L (ref 38–126)
Anion gap: 9 (ref 5–15)
BUN: 13 mg/dL (ref 8–23)
CO2: 31 mmol/L (ref 22–32)
Calcium: 10 mg/dL (ref 8.9–10.3)
Chloride: 103 mmol/L (ref 98–111)
Creatinine: 0.79 mg/dL (ref 0.44–1.00)
GFR, Estimated: >60 mL/min
Glucose, Bld: 88 mg/dL (ref 70–99)
Potassium: 3.9 mmol/L (ref 3.5–5.1)
Sodium: 142 mmol/L (ref 135–145)
Total Bilirubin: 0.4 mg/dL (ref 0.0–1.2)
Total Protein: 7.3 g/dL (ref 6.5–8.1)

## 2024-06-17 LAB — MAGNESIUM: Magnesium: 2.4 mg/dL (ref 1.7–2.4)

## 2024-06-17 MED ORDER — FOLIC ACID 1 MG PO TABS: 1.0000 mg | ORAL_TABLET | Freq: Every day | ORAL | 2 refills | Status: AC

## 2024-06-17 MED ORDER — PROCHLORPERAZINE MALEATE 10 MG PO TABS: 10.0000 mg | ORAL_TABLET | Freq: Four times a day (QID) | ORAL | 1 refills | Status: DC | PRN

## 2024-06-17 MED ORDER — DEXAMETHASONE 4 MG PO TABS: ORAL_TABLET | ORAL | 0 refills | Status: AC

## 2024-06-17 MED ORDER — INFLUENZA VAC SPLIT HIGH-DOSE 0.5 ML IM SUSY
0.5000 mL | PREFILLED_SYRINGE | Freq: Once | INTRAMUSCULAR | Status: AC
  Administered 2024-06-17: 0.5 mL via INTRAMUSCULAR
  Filled 2024-06-17: qty 0.5

## 2024-06-17 MED ORDER — CYANOCOBALAMIN 1000 MCG/ML IJ SOLN
1000.0000 ug | Freq: Once | INTRAMUSCULAR | Status: AC
  Administered 2024-06-17: 1000 ug via SUBCUTANEOUS
  Filled 2024-06-17: qty 1

## 2024-06-17 MED ORDER — ONDANSETRON HCL 8 MG PO TABS: 8.0000 mg | ORAL_TABLET | Freq: Three times a day (TID) | ORAL | 1 refills | Status: DC | PRN

## 2024-06-17 NOTE — Progress Notes (Signed)
 " [facility] OFFICE PROGRESS NOTE   Diagnosis: Lung cancer  INTERVAL HISTORY:   [name] returns as scheduled.  She is here with her sons.  She feels well.  No complaint.  She has reviewed reading materials on Alimta, cisplatin, and durvalumab.  She would like to proceed with chemotherapy, but does not wish to receive immunotherapy.  Objective:  Vital signs in last 24 hours:  Blood pressure 134/61, pulse (!) 58, temperature 97.9 F (36.6 C), temperature source Temporal, resp. rate 14, weight 133 lb 11.2 oz (60.6 kg), SpO2 97%.    Resp: End inspiratory coarse rhonchi throughout the left posterior chest, no respiratory distress Cardio: Regular rate and rhythm GI: No hepatosplenomegaly Vascular: No leg edema  Skin: Healed surgical incisions at the right chest   Lab Results:  Lab Results  Component Value Date   WBC 6.6 06/17/2024   HGB 11.1 (L) 06/17/2024   HCT 34.3 (L) 06/17/2024   MCV 91.0 06/17/2024   PLT 220 06/17/2024   NEUTROABS 3.0 06/17/2024    CMP  Lab Results  Component Value Date   NA 136 05/21/2024   K 3.6 05/21/2024   CL 99 05/21/2024   CO2 28 05/21/2024   GLUCOSE 105 (H) 05/21/2024   BUN 9 05/21/2024   CREATININE 0.60 05/21/2024   CALCIUM  9.1 05/21/2024   PROT 5.9 (L) 05/16/2024   ALBUMIN  3.2 (L) 05/16/2024   AST 31 05/16/2024   ALT 16 05/16/2024   ALKPHOS 58 05/16/2024   BILITOT 0.6 05/16/2024   GFRNONAA >60 05/21/2024   GFRAA >60 04/10/2019    Lab Results  Component Value Date   CEA1 1.07 03/01/2020   CEA 1.82 02/28/2024    wed the patient's current medications.   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 Adjuvant FOLFOX chemotherapy was initiated on 01/21/2012. The last cycle was completed on 06/30/2012. Restaging CT scans 09/30/2012 showed no evidence of recurrent/metastatic disease. -Surveillance colonoscopy 02/24/2013  with a single hyperplastic polyp. Next colonoscopy recommended at a 3 year interval.  -Surveillance CT scans 10/12/2013 with no evidence of metastatic disease   -Surveillance CT scans 10/14/2014 with no evidence of metastatic disease -Negative surveillance colonoscopy 03/30/2016  -Negative surveillance colonoscopy 02/28/2021 -Negative colonoscopy 08/15/2021 -CT chest 08/17/2023: 3.7 cm right upper lobe mass, 10 mm left upper lobe nodule -CTs 03/07/2024-enlargement of spiculated subpleural mass peripheral right upper lobe measuring 4.7 x 2.9 cm previously 3.7 x 2.5 cm.  Newly enlarged right hilar lymph node.  Unchanged left upper lobe nodule.  Multiple additional tiny nodules scattered in the apices unchanged.  No evidence of lymphadenopathy or metastatic disease in the abdomen or pelvis. -PET scan 03/20/2024-right upper lobe pulmonary mass is intensely hypermetabolic.  No other evidence of metastatic disease.  Specifically no hypermetabolic right hilar adenopathy.  Other scattered small pulmonary nodules are stable and without hypermetabolic activity although are suboptimally evaluated based on size. -04/01/2024 biopsy right lung mass-adenocarcinoma with morphologic features typical of metastatic colonic adenocarcinoma; IHC stains show cytokeratin 20 positive, CDX2/TTF-1/Napsin A negative.  MMR with preserved expression; PD-L1 TPS 0%; foundation 1-microsatellite stable, tumor mutation burden 16, KRAS/NRAS wild-type, FGFR1 amplification, RB1 loss -04/08/2024 tumor board discussion-surgical consult for resection of right lung mass, radiation not recommended due to size; continue to monitor left lung lesion -Appointment with Dr. Kerrin 05/05/2024 Delayed nausea following cycle 2 FOLFOX. Aloxi  was added with cycle 3. Persistent delayed nausea. Emend was added with cycle  4. She noted improvement in the nausea following cycle 4. She declined steroid prophylaxis.   Tiny lung nodules seen on a CT of the chest  10/19/2011. Stable on CT of the chest 10/12/2013 and 10/14/2014 History of tobacco use Endometrial thickening on CT of the abdomen 10/02/2011. Progressive endometrial thickening on CT 09/30/2012. Status post removal of an endometrial polyp 02/03/2013 with benign pathology. Port-A-Cath placement 01/09/2012 in interventional radiology. Removed 12/30/2012. 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 The finger numbness has improved.   Mild thrombocytopenia secondary to chemotherapy-resolved. History of neutropenia secondary to chemotherapy. 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 08/17/2023 CT chest-3.7 cm right upper lobe mass and 10 mm left upper lobe nodule CTs 03/07/2024-interval enlargement of a spiculated subpleural mass peripheral right upper lobe measuring 4.7 x 2.9 cm previously 3.7 x 2.5 cm.  Newly enlarged right hilar node consistent with nodal metastatic disease.  Unchanged rounded left upper lobe nodule measuring 1.0 x 0.8 cm.  Multiple additional tiny nodules scattered in the apices unchanged.  No evidence of lymphadenopathy or metastatic disease in the abdomen or pelvis. PET scan 03/20/2024-enlarging right upper lobe pulmonary mass intensely hypermetabolic.  No other evidence of metastatic disease.  Specifically no hypermetabolic right hilar adenopathy.  Other scattered small pulmonary nodules are stable and without hypermetabolic activity. 04/01/2024 biopsy right lung mass-adenocarcinoma with morphologic features typical of metastatic colonic adenocarcinoma; IHC stains show cytokeratin 20 positive, CDX2/TTF-1/Napsin A negative. MMR with preserved expression. PD-L1 TPS 0%; foundation 1-microsatellite stable, tumor mutation burden 16, KRAS/NRAS wild-type, FGFR1 amplification, RB1 loss Referred to Dr. Kerrin 05/14/2024 right upper and middle bilobectomy with extrapleural resection at site of tumor; pathology on the lobectomy specimen showed large  cell neuroendocrine carcinoma with direct invasion into adjacent lobe of lung and parietal pleura; STA S present; visceral pleura invasion present; direct invasion of adjacent structures present including adjacent lobe of lung and parietal pleura; lymphatic and/or vascular invasion not identified; margins negative; all regional lymph nodes negative for tumor; T3 N0.   Patient report of weight loss 9/19/202 Postoperative atrial fibrillation November 2025    Disposition: Ms. 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  She was diagnosed with pathologic stage II disease.  We discussed the benefit associated with adjuvant therapy.  She understands the absolute survival benefit is small.  She does not wish to consider immunotherapy based on side effects and the tumor had a PD-L1 score of 0%.  I recommend Alimta/cisplatin.  We also discussed Alimta/carboplatin.  She understands there is data to support a survival benefit with cisplatin based chemotherapy.  She agrees to Alimta/cisplatin.  We reviewed potential toxicities associated with this regimen including the chance of nausea/vomiting, alopecia, hematologic toxicity, infection, and bleeding.  We discussed the rash, swelling, decreased and alteration of the vitamin B12 pathway with Alimta.  We reviewed the neuropathy, ototoxicity, and nephrotoxicity associated with cisplatin.  She will attend a chemotherapy teaching class.  She received vitamin B12 today.  She will begin folic acid .  She will take Decadron  prophylaxis prior to and following chemotherapy.  Ms. Pennywell will be scheduled for cycle 1 Alimta/cisplatin 07/02/2023.  A chemotherapy treatment plan was entered today.  Ms Wakeman received an influenza vaccine today.  Arley Hof, MD  06/17/2024  12:56 PM   "

## 2024-06-17 NOTE — Progress Notes (Signed)
 START ON PATHWAY REGIMEN - Non-Small Cell Lung     A cycle is every 21 days:     Pemetrexed      Cisplatin   **Always confirm dose/schedule in your pharmacy ordering system**  Patient Characteristics: Postoperative without Neoadjuvant Therapy (Pathologic Staging), Stage IB (Tumor Size = 4 cm) or Stage II / III, Adjuvant Chemotherapy Planned, Stage IB (Tumor Size = 4 cm), or Stage II / III, Nonsquamous Cell Therapeutic Status: Postoperative without Neoadjuvant Therapy (Pathologic Staging) Check here if patient was staged using an edition other than AJCC Staging 9th Edition: false AJCC T Category: pT3 AJCC N Category: pN0 AJCC M Category: cM0 AJCC 9 Stage Grouping: IIB Adjuvant Chemotherapy Status: Adjuvant Chemotherapy Planned Histology: Nonsquamous Cell Intent of Therapy: Curative Intent, Discussed with Patient

## 2024-06-18 ENCOUNTER — Other Ambulatory Visit: Payer: Self-pay

## 2024-06-19 ENCOUNTER — Encounter: Payer: Self-pay | Admitting: Oncology

## 2024-06-19 ENCOUNTER — Telehealth: Payer: Self-pay | Admitting: Pharmacist

## 2024-06-19 ENCOUNTER — Other Ambulatory Visit (HOSPITAL_COMMUNITY): Payer: Self-pay

## 2024-06-19 DIAGNOSIS — I4891 Unspecified atrial fibrillation: Secondary | ICD-10-CM

## 2024-06-19 MED ORDER — APIXABAN 5 MG PO TABS: 5.0000 mg | ORAL_TABLET | Freq: Two times a day (BID) | ORAL | 0 refills | Status: DC

## 2024-06-19 NOTE — Telephone Encounter (Signed)
 Received message from pharmacy that patient is unable to afford Eliquis . Gave 2 weeks of samples. Spoke with patient and son and advised benefits will reset on January 1 so price will likely be different. Patient may need more samples in January

## 2024-06-23 ENCOUNTER — Encounter: Payer: Self-pay | Admitting: Oncology

## 2024-06-23 ENCOUNTER — Telehealth: Payer: Self-pay | Admitting: *Deleted

## 2024-06-23 ENCOUNTER — Telehealth: Payer: Self-pay | Admitting: Nurse Practitioner

## 2024-06-23 ENCOUNTER — Other Ambulatory Visit: Payer: Self-pay | Admitting: *Deleted

## 2024-06-23 ENCOUNTER — Inpatient Hospital Stay

## 2024-06-23 DIAGNOSIS — C7A1 Malignant poorly differentiated neuroendocrine tumors: Secondary | ICD-10-CM

## 2024-06-23 NOTE — Telephone Encounter (Signed)
 Spoke with PT about upcoming appts, day and time confirmed.

## 2024-06-23 NOTE — Telephone Encounter (Signed)
 Left message asking if she needs a baseline audiology exam before starting chemo.

## 2024-06-24 ENCOUNTER — Encounter: Payer: Self-pay | Admitting: Oncology

## 2024-06-24 NOTE — Telephone Encounter (Signed)
 Informed Seila to start the folic acid  now. 1st dose of dexamethasone  is on 1/06 (day before chemo). Nurse educator will go over all this in detail on her 1/6 education session and make her a chart of how to take her meds. Audiology referral has been placed and it will be fine to not have it before her 1st treatment as long as we can get it done before the 2nd treatment.

## 2024-06-26 ENCOUNTER — Other Ambulatory Visit: Payer: Self-pay | Admitting: Oncology

## 2024-06-26 NOTE — Progress Notes (Signed)
 Pharmacist Chemotherapy Monitoring - Initial Assessment    Anticipated start date: 07/01/24   The following has been reviewed per standard work regarding the patient's treatment regimen: The patient's diagnosis, treatment plan and drug doses, and organ/hematologic function Lab orders and baseline tests specific to treatment regimen  The treatment plan start date, drug sequencing, and pre-medications Prior authorization status  Patient's documented medication list, including drug-drug interaction screen and prescriptions for anti-emetics and supportive care specific to the treatment regimen The drug concentrations, fluid compatibility, administration routes, and timing of the medications to be used The patient's access for treatment and lifetime cumulative dose history, if applicable  The patient's medication allergies and previous infusion related reactions, if applicable   Changes made to treatment plan:  N/A  Follow up needed:  Pending authorization for treatment    Ashlei Chinchilla Kreul, [facility], 06/26/2024  1:34 PM

## 2024-06-30 ENCOUNTER — Inpatient Hospital Stay: Attending: Oncology

## 2024-06-30 ENCOUNTER — Encounter: Payer: Self-pay | Admitting: Nurse Practitioner

## 2024-06-30 ENCOUNTER — Inpatient Hospital Stay: Admitting: Nurse Practitioner

## 2024-06-30 VITALS — BP 154/63 | HR 65 | Temp 97.8°F | Resp 18 | Ht 65.0 in | Wt 132.7 lb

## 2024-06-30 DIAGNOSIS — C189 Malignant neoplasm of colon, unspecified: Secondary | ICD-10-CM | POA: Diagnosis not present

## 2024-06-30 DIAGNOSIS — T451X5A Adverse effect of antineoplastic and immunosuppressive drugs, initial encounter: Secondary | ICD-10-CM | POA: Insufficient documentation

## 2024-06-30 DIAGNOSIS — G620 Drug-induced polyneuropathy: Secondary | ICD-10-CM | POA: Insufficient documentation

## 2024-06-30 DIAGNOSIS — Z5111 Encounter for antineoplastic chemotherapy: Secondary | ICD-10-CM | POA: Diagnosis present

## 2024-06-30 DIAGNOSIS — I4891 Unspecified atrial fibrillation: Secondary | ICD-10-CM | POA: Diagnosis not present

## 2024-06-30 DIAGNOSIS — Z87891 Personal history of nicotine dependence: Secondary | ICD-10-CM | POA: Diagnosis not present

## 2024-06-30 DIAGNOSIS — Z86718 Personal history of other venous thrombosis and embolism: Secondary | ICD-10-CM | POA: Insufficient documentation

## 2024-06-30 DIAGNOSIS — C7801 Secondary malignant neoplasm of right lung: Secondary | ICD-10-CM | POA: Insufficient documentation

## 2024-06-30 DIAGNOSIS — C7A1 Malignant poorly differentiated neuroendocrine tumors: Secondary | ICD-10-CM | POA: Diagnosis not present

## 2024-06-30 NOTE — Progress Notes (Signed)
 " [facility] OFFICE PROGRESS NOTE   Diagnosis: Lung cancer  INTERVAL HISTORY:   Ms. Corsi returns for follow-up.  She is scheduled to begin treatment with Alimta /cisplatin  tomorrow.  She feels well.  Appetite described as okay.  No nausea or vomiting.  No constipation or diarrhea.  No shortness of breath.  No cough or fever.  She has occasional pain in the region of the right axilla.  She confirms she is taking folic acid .  Objective:  Vital signs in last 24 hours:  Blood pressure (!) 154/63, pulse 65, temperature 97.8 F (36.6 C), temperature source Temporal, resp. rate 18, height 5' 5 (1.651 m), weight 132 lb 11.2 oz (60.2 kg), SpO2 100%.    HEENT: No thrush or ulcers. Resp: Lungs clear bilaterally. Cardio: Regular rate and rhythm. GI: No hepatosplenomegaly.  Nontender. Vascular: No leg edema. Skin: Healed surgical incisions right chest.   Lab Results:  Lab Results  Component Value Date   WBC 6.6 06/17/2024   HGB 11.1 (L) 06/17/2024   HCT 34.3 (L) 06/17/2024   MCV 91.0 06/17/2024   PLT 220 06/17/2024   NEUTROABS 3.0 06/17/2024    Imaging:  No results found.  Medications: I have reviewed the patient's current medications.  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 Adjuvant FOLFOX chemotherapy was initiated on 01/21/2012. The last cycle was completed on 06/30/2012. Restaging CT scans 09/30/2012 showed no evidence of recurrent/metastatic disease. -Surveillance colonoscopy 02/24/2013 with a single hyperplastic polyp. Next colonoscopy recommended at a 3 year interval.  -Surveillance CT scans 10/12/2013 with no evidence of metastatic disease   -Surveillance CT scans 10/14/2014 with no evidence of metastatic disease -Negative surveillance colonoscopy 03/30/2016  -Negative surveillance colonoscopy 02/28/2021 -Negative colonoscopy 08/15/2021 -CT chest  08/17/2023: 3.7 cm right upper lobe mass, 10 mm left upper lobe nodule -CTs 03/07/2024-enlargement of spiculated subpleural mass peripheral right upper lobe measuring 4.7 x 2.9 cm previously 3.7 x 2.5 cm.  Newly enlarged right hilar lymph node.  Unchanged left upper lobe nodule.  Multiple additional tiny nodules scattered in the apices unchanged.  No evidence of lymphadenopathy or metastatic disease in the abdomen or pelvis. -PET scan 03/20/2024-right upper lobe pulmonary mass is intensely hypermetabolic.  No other evidence of metastatic disease.  Specifically no hypermetabolic right hilar adenopathy.  Other scattered small pulmonary nodules are stable and without hypermetabolic activity although are suboptimally evaluated based on size. -04/01/2024 biopsy right lung mass-adenocarcinoma with morphologic features typical of metastatic colonic adenocarcinoma; IHC stains show cytokeratin 20 positive, CDX2/TTF-1/Napsin A negative.  MMR with preserved expression; PD-L1 TPS 0%; foundation 1-microsatellite stable, tumor mutation burden 16, KRAS/NRAS wild-type, FGFR1 amplification, RB1 loss -04/08/2024 tumor board discussion-surgical consult for resection of right lung mass, radiation not recommended due to size; continue to monitor left lung lesion -Appointment with Dr. Kerrin 05/05/2024 Delayed nausea following cycle 2 FOLFOX. Aloxi  was added with cycle 3. Persistent delayed nausea. Emend was added with cycle 4. She noted improvement in the nausea following cycle 4. She declined steroid prophylaxis.   Tiny lung nodules seen on a CT of the chest 10/19/2011. Stable on CT of the chest 10/12/2013 and 10/14/2014 History of tobacco use Endometrial thickening on CT of the abdomen 10/02/2011. Progressive endometrial thickening on CT 09/30/2012. Status post removal of an endometrial polyp 02/03/2013 with benign pathology. Port-A-Cath placement 01/09/2012 in interventional radiology. Removed 12/30/2012. 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 The finger numbness has  improved.   Mild thrombocytopenia secondary to chemotherapy-resolved. History of neutropenia secondary to chemotherapy. 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 08/17/2023 CT chest-3.7 cm right upper lobe mass and 10 mm left upper lobe nodule CTs 03/07/2024-interval enlargement of a spiculated subpleural mass peripheral right upper lobe measuring 4.7 x 2.9 cm previously 3.7 x 2.5 cm.  Newly enlarged right hilar node consistent with nodal metastatic disease.  Unchanged rounded left upper lobe nodule measuring 1.0 x 0.8 cm.  Multiple additional tiny nodules scattered in the apices unchanged.  No evidence of lymphadenopathy or metastatic disease in the abdomen or pelvis. PET scan 03/20/2024-enlarging right upper lobe pulmonary mass intensely hypermetabolic.  No other evidence of metastatic disease.  Specifically no hypermetabolic right hilar adenopathy.  Other scattered small pulmonary nodules are stable and without hypermetabolic activity. 04/01/2024 biopsy right lung mass-adenocarcinoma with morphologic features typical of metastatic colonic adenocarcinoma; IHC stains show cytokeratin 20 positive, CDX2/TTF-1/Napsin A negative. MMR with preserved expression. PD-L1 TPS 0%; foundation 1-microsatellite stable, tumor mutation burden 16, KRAS/NRAS wild-type, FGFR1 amplification, RB1 loss Referred to Dr. 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    Patient report of weight loss 9/19/202 Postoperative atrial fibrillation November 2025      Disposition: Ms. Straker appears stable.  She is scheduled to begin treatment tomorrow with Alimta /cisplatin .  We again reviewed potential toxicities.  She agrees to proceed.  She is attending a chemotherapy education class today.  She received a B12 injection 06/17/2024.  She confirms she is taking daily folic acid .  She has Compazine  and Zofran  at home if needed.    She will return for follow-up as scheduled in 3 weeks.  We are available to see her sooner if needed.    Olam Ned ANP/GNP-BC   06/30/2024  11:00 AM        "

## 2024-07-01 ENCOUNTER — Inpatient Hospital Stay

## 2024-07-01 VITALS — BP 118/86 | HR 72 | Temp 97.7°F | Resp 18

## 2024-07-01 DIAGNOSIS — C7A1 Malignant poorly differentiated neuroendocrine tumors: Secondary | ICD-10-CM

## 2024-07-01 DIAGNOSIS — Z5111 Encounter for antineoplastic chemotherapy: Secondary | ICD-10-CM | POA: Diagnosis not present

## 2024-07-01 MED ORDER — DEXAMETHASONE SOD PHOSPHATE PF 10 MG/ML IJ SOLN
10.0000 mg | Freq: Once | INTRAMUSCULAR | Status: AC
  Administered 2024-07-01: 10 mg via INTRAVENOUS
  Filled 2024-07-01: qty 1

## 2024-07-01 MED ORDER — SODIUM CHLORIDE 0.9 % IV SOLN
500.0000 mg/m2 | Freq: Once | INTRAVENOUS | Status: AC
  Administered 2024-07-01: 800 mg via INTRAVENOUS
  Filled 2024-07-01: qty 20

## 2024-07-01 MED ORDER — SODIUM CHLORIDE 0.9 % IV SOLN
150.0000 mg | Freq: Once | INTRAVENOUS | Status: AC
  Administered 2024-07-01: 150 mg via INTRAVENOUS
  Filled 2024-07-01: qty 150

## 2024-07-01 MED ORDER — SODIUM CHLORIDE 0.9 % IV SOLN
50.0000 mg/m2 | Freq: Once | INTRAVENOUS | Status: AC
  Administered 2024-07-01: 84 mg via INTRAVENOUS
  Filled 2024-07-01: qty 34

## 2024-07-01 MED ORDER — LORAZEPAM 2 MG/ML IJ SOLN
0.5000 mg | Freq: Once | INTRAMUSCULAR | Status: AC
  Administered 2024-07-01: 0.5 mg via INTRAVENOUS
  Filled 2024-07-01: qty 1

## 2024-07-01 MED ORDER — PALONOSETRON HCL INJECTION 0.25 MG/5ML
0.2500 mg | Freq: Once | INTRAVENOUS | Status: AC
  Administered 2024-07-01: 0.25 mg via INTRAVENOUS
  Filled 2024-07-01: qty 5

## 2024-07-01 MED ORDER — POTASSIUM CHLORIDE IN NACL 20-0.9 MEQ/L-% IV SOLN
Freq: Once | INTRAVENOUS | Status: AC
  Filled 2024-07-01: qty 1000

## 2024-07-01 MED ORDER — SODIUM CHLORIDE 0.9 % IV SOLN: INTRAVENOUS | Status: DC

## 2024-07-01 MED ORDER — MAGNESIUM SULFATE 2 GM/50ML IV SOLN
2.0000 g | Freq: Once | INTRAVENOUS | Status: AC
  Administered 2024-07-01: 2 g via INTRAVENOUS
  Filled 2024-07-01: qty 50

## 2024-07-01 NOTE — Patient Instructions (Signed)
 [facility] - A DEPT OF [facility]   Discharge Instructions: Thank you for choosing [facility] to provide your oncology and hematology care.   If you have a lab appointment with the Cancer Center, please go directly to the Cancer Center and check in at the registration area.   Wear comfortable clothing and clothing appropriate for easy access to any Portacath or PICC line.   We strive to give you quality time with your provider. You may need to reschedule your appointment if you arrive late (15 or more minutes).  Arriving late affects you and other patients whose appointments are after yours.  Also, if you miss three or more appointments without notifying the office, you may be dismissed from the clinic at the providers discretion.      For prescription refill requests, have your pharmacy contact our office and allow 72 hours for refills to be completed.    Today you received the following chemotherapy and/or immunotherapy agents Pemetrexed  (ALIMTA ) & Cisplatin  (PLATINOL ).      To help prevent nausea and vomiting after your treatment, we encourage you to take your nausea medication as directed.  BELOW ARE SYMPTOMS THAT SHOULD BE REPORTED IMMEDIATELY: *FEVER GREATER THAN 100.4 F (38 C) OR HIGHER *CHILLS OR SWEATING *NAUSEA AND VOMITING THAT IS NOT CONTROLLED WITH YOUR NAUSEA MEDICATION *UNUSUAL SHORTNESS OF BREATH *UNUSUAL BRUISING OR BLEEDING *URINARY PROBLEMS (pain or burning when urinating, or frequent urination) *BOWEL PROBLEMS (unusual diarrhea, constipation, pain near the anus) TENDERNESS IN MOUTH AND THROAT WITH OR WITHOUT PRESENCE OF ULCERS (sore throat, sores in mouth, or a toothache) UNUSUAL RASH, SWELLING OR PAIN  UNUSUAL VAGINAL DISCHARGE OR ITCHING   Items with * indicate a potential emergency and should be followed up as soon as possible or go to the Emergency Department if any problems should occur.  Please show the  CHEMOTHERAPY ALERT CARD or IMMUNOTHERAPY ALERT CARD at check-in to the Emergency Department and triage nurse.  Should you have questions after your visit or need to cancel or reschedule your appointment, please contact [facility] - A DEPT OF [facility]  Dept: [phone]  and follow the prompts.  Office hours are 8:00 a.m. to 4:30 p.m. Monday - Friday. Please note that voicemails left after 4:00 p.m. may not be returned until the following business day.  We are closed weekends and major holidays. You have access to a nurse at all times for urgent questions. Please call the main number to the clinic Dept: [phone] and follow the prompts.   For any non-urgent questions, you may also contact your provider using MyChart. We now offer e-Visits for anyone 69 and older to request care online for non-urgent symptoms. For details visit [email or web address].   Also download the MyChart app! Go to the app store, search MyChart, open the app, select [facility], and log in with your MyChart username and password.  Pemetrexed  Injection What is this medication? PEMETREXED  (PEM e TREX ed) treats some types of cancer. It works by slowing down the growth of cancer cells. This medicine may be used for other purposes; ask your health care provider or pharmacist if you have questions. COMMON BRAND NAME(S): Alimta , PEMFEXY, PEMRYDI RTU What should I tell my care team before I take this medication? They need to know if you have any of these conditions: Infection, such as chickenpox, cold sores, or herpes Kidney disease Low blood cell levels (white cells,  red cells, and platelets) Lung or breathing disease, such as asthma Radiation therapy An unusual or allergic reaction to pemetrexed , other medications, foods, dyes, or preservatives If you or your partner are pregnant or trying to get pregnant Breast-feeding How should I use this medication? This medication is injected  into a vein. It is given by your care team in a hospital or clinic setting. Talk to your care team about the use of this medication in children. Special care may be needed. Overdosage: If you think you have taken too much of this medicine contact a poison control center or emergency room at once. NOTE: This medicine is only for you. Do not share this medicine with others. What if I miss a dose? Keep appointments for follow-up doses. It is important not to miss your dose. Call your care team if you are unable to keep an appointment. What may interact with this medication? Do not take this medication with any of the following: Live virus vaccines This medication may also interact with the following: Ibuprofen  This list may not describe all possible interactions. Give your health care provider a list of all the medicines, herbs, non-prescription drugs, or dietary supplements you use. Also tell them if you smoke, drink alcohol, or use illegal drugs. Some items may interact with your medicine. What should I watch for while using this medication? Your condition will be monitored carefully while you are receiving this medication. This medication may make you feel generally unwell. This is not uncommon as chemotherapy can affect healthy cells as well as cancer cells. Report any side effects. Continue your course of treatment even though you feel ill unless your care team tells you to stop. This medication can cause serious side effects. To reduce the risk, your care team may give you other medications to take before receiving this one. Be sure to follow the directions from your care team. This medication can cause a rash or redness in areas of the body that have previously had radiation therapy. If you have had radiation therapy, tell your care team if you notice a rash in this area. This medication may increase your risk of getting an infection. Call your care team for advice if you get a fever, chills,  sore throat, or other symptoms of a cold or flu. Do not treat yourself. Try to avoid being around people who are sick. Be careful brushing or flossing your teeth or using a toothpick because you may get an infection or bleed more easily. If you have any dental work done, tell your dentist you are receiving this medication. Avoid taking medications that contain aspirin, acetaminophen , ibuprofen , naproxen, or ketoprofen unless instructed by your care team. These medications may hide a fever. Check with your care team if you have severe diarrhea, nausea, and vomiting, or if you sweat a lot. The loss of too much body fluid may make it dangerous for you to take this medication. Talk to your care team if you or your partner wish to become pregnant or think either of you might be pregnant. This medication can cause serious birth defects if taken during pregnancy and for 6 months after the last dose. A negative pregnancy test is required before starting this medication. A reliable form of contraception is recommended while taking this medication and for 6 months after the last dose. Talk to your care team about reliable forms of contraception. Do not father a child while taking this medication and for 3 months after the last  dose. Use a condom while having sex during this time period. Do not breastfeed while taking this medication and for 1 week after the last dose. This medication may cause infertility. Talk to your care team if you are concerned about your fertility. What side effects may I notice from receiving this medication? Side effects that you should report to your care team as soon as possible: Allergic reactions--skin rash, itching, hives, swelling of the face, lips, tongue, or throat Dry cough, shortness of breath or trouble breathing Infection--fever, chills, cough, sore throat, wounds that don't heal, pain or trouble when passing urine, general feeling of discomfort or being unwell Kidney  injury--decrease in the amount of urine, swelling of the ankles, hands, or feet Low red blood cell level--unusual weakness or fatigue, dizziness, headache, trouble breathing Redness, blistering, peeling, or loosening of the skin, including inside the mouth Unusual bruising or bleeding Side effects that usually do not require medical attention (report to your care team if they continue or are bothersome): Fatigue Loss of appetite Nausea Vomiting This list may not describe all possible side effects. Call your doctor for medical advice about side effects. You may report side effects to FDA at 1-800-FDA-1088. Where should I keep my medication? This medication is given in a hospital or clinic. It will not be stored at home. NOTE: This sheet is a summary. It may not cover all possible information. If you have questions about this medicine, talk to your doctor, pharmacist, or health care provider.  2024 Elsevier/Gold Standard (2021-10-17 00:00:00)  Cisplatin  Injection What is this medication? CISPLATIN  (SIS pla tin) treats some types of cancer. It works by slowing down the growth of cancer cells. This medicine may be used for other purposes; ask your health care provider or pharmacist if you have questions. COMMON BRAND NAME(S): Platinol , Platinol  -AQ What should I tell my care team before I take this medication? They need to know if you have any of these conditions: Eye disease, vision problems Hearing problems Kidney disease Low blood counts, such as low white cells, platelets, or red blood cells Tingling of the fingers or toes, or other nerve disorder An unusual or allergic reaction to cisplatin , carboplatin, oxaliplatin , other medications, foods, dyes, or preservatives If you or your partner are pregnant or trying to get pregnant Breast-feeding How should I use this medication? This medication is injected into a vein. It is given by your care team in a hospital or clinic setting. Talk  to your care team about the use of this medication in children. Special care may be needed. Overdosage: If you think you have taken too much of this medicine contact a poison control center or emergency room at once. NOTE: This medicine is only for you. Do not share this medicine with others. What if I miss a dose? Keep appointments for follow-up doses. It is important not to miss your dose. Call your care team if you are unable to keep an appointment. What may interact with this medication? Do not take this medication with any of the following: Live virus vaccines This medication may also interact with the following: Certain antibiotics, such as amikacin, gentamicin, neomycin, polymyxin B, streptomycin, tobramycin, vancomycin  Foscarnet This list may not describe all possible interactions. Give your health care provider a list of all the medicines, herbs, non-prescription drugs, or dietary supplements you use. Also tell them if you smoke, drink alcohol, or use illegal drugs. Some items may interact with your medicine. What should I watch for while  using this medication? Your condition will be monitored carefully while you are receiving this medication. You may need blood work done while taking this medication. This medication may make you feel generally unwell. This is not uncommon, as chemotherapy can affect healthy cells as well as cancer cells. Report any side effects. Continue your course of treatment even though you feel ill unless your care team tells you to stop. This medication may increase your risk of getting an infection. Call your care team for advice if you get a fever, chills, sore throat, or other symptoms of a cold or flu. Do not treat yourself. Try to avoid being around people who are sick. Avoid taking medications that contain aspirin, acetaminophen , ibuprofen , naproxen, or ketoprofen unless instructed by your care team. These medications may hide a fever. This medication may  increase your risk to bruise or bleed. Call your care team if you notice any unusual bleeding. Be careful brushing or flossing your teeth or using a toothpick because you may get an infection or bleed more easily. If you have any dental work done, tell your dentist you are receiving this medication. Drink fluids as directed while you are taking this medication. This will help protect your kidneys. Call your care team if you get diarrhea. Do not treat yourself. Talk to your care team if you or your partner wish to become pregnant or think you might be pregnant. This medication can cause serious birth defects if taken during pregnancy and for 14 months after the last dose. A negative pregnancy test is required before starting this medication. A reliable form of contraception is recommended while taking this medication and for 14 months after the last dose. Talk to your care team about effective forms of contraception. Do not father a child while taking this medication and for 11 months after the last dose. Use a condom during sex during this time period. Do not breast-feed while taking this medication. This medication may cause infertility. Talk to your care team if you are concerned about your fertility. What side effects may I notice from receiving this medication? Side effects that you should report to your care team as soon as possible: Allergic reactions--skin rash, itching, hives, swelling of the face, lips, tongue, or throat Eye pain, change in vision, vision loss Hearing loss, ringing in ears Infection--fever, chills, cough, sore throat, wounds that don't heal, pain or trouble when passing urine, general feeling of discomfort or being unwell Kidney injury--decrease in the amount of urine, swelling of the ankles, hands, or feet Low red blood cell level--unusual weakness or fatigue, dizziness, headache, trouble breathing Painful swelling, warmth, or redness of the skin, blisters or sores at the  infusion site Pain, tingling, or numbness in the hands or feet Unusual bruising or bleeding Side effects that usually do not require medical attention (report to your care team if they continue or are bothersome): Hair loss Nausea Vomiting This list may not describe all possible side effects. Call your doctor for medical advice about side effects. You may report side effects to FDA at 1-800-FDA-1088. Where should I keep my medication? This medication is given in a hospital or clinic. It will not be stored at home. NOTE: This sheet is a summary. It may not cover all possible information. If you have questions about this medicine, talk to your doctor, pharmacist, or health care provider.  2024 Elsevier/Gold Standard (2021-10-13 00:00:00)

## 2024-07-02 ENCOUNTER — Telehealth: Payer: Self-pay | Admitting: Pharmacist

## 2024-07-02 ENCOUNTER — Telehealth: Payer: Self-pay

## 2024-07-02 DIAGNOSIS — I4891 Unspecified atrial fibrillation: Secondary | ICD-10-CM

## 2024-07-02 MED ORDER — APIXABAN 5 MG PO TABS: 5.0000 mg | ORAL_TABLET | Freq: Two times a day (BID) | ORAL | 0 refills | Status: DC

## 2024-07-02 NOTE — Telephone Encounter (Signed)
 Patient called for sample request. Will give 2 weeks and forward to pharmacy team for updated copay since new year has started.

## 2024-07-02 NOTE — Telephone Encounter (Signed)
 24 hr call back:  Called pt to see how she was doing after 1st time treatment yesterday. Pt states she is doing fine, no complaints. Pt states she is drinking plenty of water. Informed pt to call clinic if she has any concerns or questions.

## 2024-07-03 ENCOUNTER — Encounter: Payer: Self-pay | Admitting: Oncology

## 2024-07-03 ENCOUNTER — Other Ambulatory Visit (HOSPITAL_COMMUNITY): Payer: Self-pay

## 2024-07-09 ENCOUNTER — Encounter: Payer: Self-pay | Admitting: Audiology

## 2024-07-09 ENCOUNTER — Ambulatory Visit: Attending: Oncology | Admitting: Audiology

## 2024-07-09 DIAGNOSIS — H903 Sensorineural hearing loss, bilateral: Secondary | ICD-10-CM | POA: Insufficient documentation

## 2024-07-10 ENCOUNTER — Other Ambulatory Visit: Payer: Self-pay | Admitting: Thoracic Surgery (Cardiothoracic Vascular Surgery)

## 2024-07-10 ENCOUNTER — Other Ambulatory Visit: Payer: Self-pay

## 2024-07-10 DIAGNOSIS — R918 Other nonspecific abnormal finding of lung field: Secondary | ICD-10-CM

## 2024-07-10 NOTE — Procedures (Signed)
 " Outpatient Audiology and [facility] [address] [place], [place]  [address] [phone]  Ototoxic Monitoring Baseline Audiologic Evaluation   NAME: [name]     DOB:   04/10/1946      MRN: [id number]                                                                                     DATE: 07/10/2024     REFERENT: Cloretta STATUS: Outpatient DIAGNOSIS: Audiologic Evaluation for the Purpose of Ototoxic Monitoring  History: Karima was seen for an audiological evaluation. Aasiyah is receiving a hearing evaluation to establish their baseline hearing thresholds before treatment with ototoxic mediation. 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 Manjit is receiving treatment with Alimta /cisplatin . Denyla denies concerns regarding her hearing sensitivity. Amayia  denies otalgia, aural fullness, tinnitus, and dizziness.   Results from today's evaluation will be used to measure changes in patient's hearing thresholds during and six months after ototoxic medication exposure.   Evaluation:  Otoscopy showed a clear view of the tympanic membranes, bilaterally Tympanometry results were consistent with normal middle ear function (Type A) , bilaterally  Audiometric testing was completed using conventional and high frequency audiometry with headphones transducer. Speech Detection Thresholds were at 15 dB HL in the right ear and 10 dB HL in the left ear. Word Recognition was obtained at 60 db HL and was 100% in both ears.  Pure tone thresholds were obtained in the normal hearing range sloping to a moderate sensorineural hearing loss. High frequency audiometry showed a moderate to profound sensorineural hearing loss.   Sensitive Range to Ototoxicity (SRO)  is 10,000 k to 12,000 k Hz in the right ear and 9000-11,000 Hz in the left ear. three highest frequency thresholds below 100dB    Results:  The test results were reviewed with Erminio. 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   At future appointments only SRO threshold testing, DPOAEs and tympanometry will be performed to monitor hearing. Results will be compared to monitor change in hearing due to ototoxic medication.   During treatment with ototoxic medication hearing is more vulnerable to damage from high levels of noise. Make sure to keep headphone volume below 60% and wear headphones no longer than 60 minutes at a time. Avoid any noise louder than 85B, which is roughly equivalent to busy traffic noise. Wear an earplug with NRR of 25 to reduce noise levels. Proper insertion of a generic foam earplug is necessary to get the full reduction in volume. Over the ear muffs with an NRR of 25 are also recommended and are not susceptible to leakage from improper insertion.  For guidance on how to wear earplugs the correct way visit: [email or web address]    Recommendations: 1.   Return for a hearing evaluation in April. Hearing evaluation should be performed earlier if a change in hearing is perceived or patient develops tinnitus.   2.  Wear hearing protection with an NRR of at least 25dB whenever exposed to loud noise. Loud noise is any level above 85dB. For example if people are raising their voices to be  heard, the volume is louder than 85dB.   3.  Wear headphones for only 60 minutes at a time, and never louder than 60% volume.   25 minutes spent testing and counseling on results.   Darryle Posey, Au.D., CCC-A Audiologist  "

## 2024-07-13 DIAGNOSIS — I4891 Unspecified atrial fibrillation: Secondary | ICD-10-CM | POA: Diagnosis not present

## 2024-07-14 ENCOUNTER — Ambulatory Visit (INDEPENDENT_AMBULATORY_CARE_PROVIDER_SITE_OTHER): Payer: Self-pay | Admitting: Thoracic Surgery (Cardiothoracic Vascular Surgery)

## 2024-07-14 ENCOUNTER — Ambulatory Visit
Admission: RE | Admit: 2024-07-14 | Discharge: 2024-07-14 | Disposition: A | Discharge status: Home / Self Care | Payer: Self-pay | Source: Ambulatory Visit | Attending: Cardiology | Admitting: Cardiology

## 2024-07-14 VITALS — BP 180/79 | HR 68 | Resp 20 | Ht 65.0 in | Wt 130.0 lb

## 2024-07-14 DIAGNOSIS — R918 Other nonspecific abnormal finding of lung field: Secondary | ICD-10-CM | POA: Insufficient documentation

## 2024-07-14 DIAGNOSIS — C7A1 Malignant poorly differentiated neuroendocrine tumors: Secondary | ICD-10-CM

## 2024-07-14 DIAGNOSIS — Z09 Encounter for follow-up examination after completed treatment for conditions other than malignant neoplasm: Secondary | ICD-10-CM | POA: Insufficient documentation
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
## 2024-07-16 ENCOUNTER — Ambulatory Visit (HOSPITAL_BASED_OUTPATIENT_CLINIC_OR_DEPARTMENT_OTHER): Admitting: Family Medicine

## 2024-07-16 ENCOUNTER — Telehealth (HOSPITAL_BASED_OUTPATIENT_CLINIC_OR_DEPARTMENT_OTHER): Payer: Self-pay | Admitting: *Deleted

## 2024-07-16 VITALS — BP 140/86 | HR 61 | Ht 65.0 in | Wt 132.2 lb

## 2024-07-16 DIAGNOSIS — Z902 Acquired absence of lung [part of]: Secondary | ICD-10-CM

## 2024-07-16 DIAGNOSIS — C7801 Secondary malignant neoplasm of right lung: Secondary | ICD-10-CM

## 2024-07-16 DIAGNOSIS — I4891 Unspecified atrial fibrillation: Secondary | ICD-10-CM

## 2024-07-16 DIAGNOSIS — I10 Essential (primary) hypertension: Secondary | ICD-10-CM

## 2024-07-16 DIAGNOSIS — I9789 Other postprocedural complications and disorders of the circulatory system, not elsewhere classified: Secondary | ICD-10-CM | POA: Diagnosis not present

## 2024-07-16 MED ORDER — APIXABAN 5 MG PO TABS: 5.0000 mg | ORAL_TABLET | Freq: Two times a day (BID) | ORAL | 0 refills | Status: AC

## 2024-07-16 NOTE — Patient Instructions (Addendum)
 Go to 2nd Floor Cardiology to pick up Eliquis  Sample

## 2024-07-16 NOTE — Telephone Encounter (Signed)
 Message below copied from secure chat sent from pts PCP to Reche Finder, NP:   Hey girl, you saw [name] a few months ago and she has an upcoming appt with [name] in Feb. She is running out of her Eliquis  in 2 days and cannot continue due to cost. She had afibb post surgery for removal of tumor from lung and placed on Amiodarone  and Eliquis  until follow up. Do you guys have any samples she could get to continue until follow up?   We do! Let me add [name] and we can pull samples for her. Porter- okay to give 4 boxes    4 boxes of Eliquis  5 mg left at the front desk for the pt to come down to pick up.
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
## 2024-07-17 ENCOUNTER — Other Ambulatory Visit: Payer: Self-pay

## 2024-07-17 NOTE — Telephone Encounter (Signed)
 Her insurance plan has a prescription deductible. She will have to meet this deductible then price for Xarelto will drop to $45 per month.   Will route to nursing team to please update patient that she will have prescription deductible and once she pays that deductible, the price will drop. If she needs to split up the prescription deductible, can enroll in [facility] Prescription Payment Plan by contacting Medicare.  If additional cost concerns can reach out to [facility] to see if she qualifies for extra assistance through Medicare. Their contact for [facility] ([facility]) is below:  [employer]. for [employer] [address] [place]  [place] [address] [phone]   Reche GORMAN Finder, NP

## 2024-07-17 NOTE — Telephone Encounter (Signed)
 Pt contacted and provided all recommendations as indicated in this message from Reche Finder, NP.   Pt aware I also sent her these recommendations to her mychart account.  Pt verbalized understanding and was appreciative for all the information and assistance provided.

## 2024-07-19 ENCOUNTER — Other Ambulatory Visit: Payer: Self-pay | Admitting: Oncology

## 2024-07-22 ENCOUNTER — Inpatient Hospital Stay

## 2024-07-22 ENCOUNTER — Inpatient Hospital Stay: Admitting: Nurse Practitioner

## 2024-07-22 ENCOUNTER — Encounter: Payer: Self-pay | Admitting: Nurse Practitioner

## 2024-07-22 VITALS — BP 158/68 | HR 72 | Temp 97.8°F | Resp 18 | Ht 65.0 in | Wt 131.9 lb

## 2024-07-22 DIAGNOSIS — C7A1 Malignant poorly differentiated neuroendocrine tumors: Secondary | ICD-10-CM | POA: Diagnosis not present

## 2024-07-22 DIAGNOSIS — Z5111 Encounter for antineoplastic chemotherapy: Secondary | ICD-10-CM | POA: Diagnosis not present

## 2024-07-22 LAB — CMP (CANCER CENTER ONLY)
ALT: 13 U/L (ref 0–44)
AST: 24 U/L (ref 15–41)
Albumin: 4.3 g/dL (ref 3.5–5.0)
Alkaline Phosphatase: 114 U/L (ref 38–126)
Anion gap: 11 (ref 5–15)
BUN: 14 mg/dL (ref 8–23)
CO2: 28 mmol/L (ref 22–32)
Calcium: 10.1 mg/dL (ref 8.9–10.3)
Chloride: 104 mmol/L (ref 98–111)
Creatinine: 0.68 mg/dL (ref 0.44–1.00)
GFR, Estimated: >60 mL/min
Glucose, Bld: 152 mg/dL — ABNORMAL HIGH (ref 70–99)
Potassium: 3.5 mmol/L (ref 3.5–5.1)
Sodium: 143 mmol/L (ref 135–145)
Total Bilirubin: 0.2 mg/dL (ref 0.0–1.2)
Total Protein: 6.8 g/dL (ref 6.5–8.1)

## 2024-07-22 LAB — CBC WITH DIFFERENTIAL (CANCER CENTER ONLY)
Abs Immature Granulocytes: 0.04 10*3/uL (ref 0.00–0.07)
Basophils Absolute: 0 10*3/uL (ref 0.0–0.1)
Basophils Relative: 0 %
Eosinophils Absolute: 0 10*3/uL (ref 0.0–0.5)
Eosinophils Relative: 0 %
HCT: 34.3 % — ABNORMAL LOW (ref 36.0–46.0)
Hemoglobin: 11.2 g/dL — ABNORMAL LOW (ref 12.0–15.0)
Immature Granulocytes: 1 %
Lymphocytes Relative: 21 %
Lymphs Abs: 0.9 10*3/uL (ref 0.7–4.0)
MCH: 29.6 pg (ref 26.0–34.0)
MCHC: 32.7 g/dL (ref 30.0–36.0)
MCV: 90.5 fL (ref 80.0–100.0)
Monocytes Absolute: 0.5 10*3/uL (ref 0.1–1.0)
Monocytes Relative: 10 %
Neutro Abs: 3 10*3/uL (ref 1.7–7.7)
Neutrophils Relative %: 68 %
Platelet Count: 426 10*3/uL — ABNORMAL HIGH (ref 150–400)
RBC: 3.79 MIL/uL — ABNORMAL LOW (ref 3.87–5.11)
RDW: 14.2 % (ref 11.5–15.5)
WBC Count: 4.4 10*3/uL (ref 4.0–10.5)
nRBC: 0 % (ref 0.0–0.2)

## 2024-07-22 LAB — MAGNESIUM: Magnesium: 2.3 mg/dL (ref 1.7–2.4)

## 2024-07-22 NOTE — Progress Notes (Signed)
 "  [facility] OFFICE PROGRESS NOTE   Diagnosis: Lung cancer  INTERVAL HISTORY:   [name] returns as scheduled.  She completed cycle 1 Alimta /cisplatin  07/01/2024.  She denies nausea/vomiting.  No mouth sores.  No diarrhea.  No rash.  No decrease in hearing.  No tinnitus.  No numbness or tingling in the hands or feet.  She denies fever, cough, shortness of breath.  She has a good appetite.  Objective:  Vital signs in last 24 hours:  Blood pressure (!) 158/68, pulse 72, temperature 97.8 F (36.6 C), temperature source Temporal, resp. rate 18, height 5' 5 (1.651 m), weight 131 lb 14.4 oz (59.8 kg), SpO2 98%.    HEENT: No thrush or ulcers. Resp: Lungs clear bilaterally. Cardio: Regular rate and rhythm. GI: No hepatosplenomegaly.  Nontender. Vascular: No leg edema.   Lab Results:  Lab Results  Component Value Date   WBC 4.4 07/22/2024   HGB 11.2 (L) 07/22/2024   HCT 34.3 (L) 07/22/2024   MCV 90.5 07/22/2024   PLT 426 (H) 07/22/2024   NEUTROABS 3.0 07/22/2024    Imaging:  No results found.  Medications: I have reviewed the patient's current medications.  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 Adjuvant FOLFOX chemotherapy was initiated on 01/21/2012. The last cycle was completed on 06/30/2012. Restaging CT scans 09/30/2012 showed no evidence of recurrent/metastatic disease. -Surveillance colonoscopy 02/24/2013 with a single hyperplastic polyp. Next colonoscopy recommended at a 3 year interval.  -Surveillance CT scans 10/12/2013 with no evidence of metastatic disease   -Surveillance CT scans 10/14/2014 with no evidence of metastatic disease -Negative surveillance colonoscopy 03/30/2016  -Negative surveillance colonoscopy 02/28/2021 -Negative colonoscopy 08/15/2021 -CT chest 08/17/2023: 3.7 cm right upper lobe mass, 10 mm left upper lobe nodule -CTs  03/07/2024-enlargement of spiculated subpleural mass peripheral right upper lobe measuring 4.7 x 2.9 cm previously 3.7 x 2.5 cm.  Newly enlarged right hilar lymph node.  Unchanged left upper lobe nodule.  Multiple additional tiny nodules scattered in the apices unchanged.  No evidence of lymphadenopathy or metastatic disease in the abdomen or pelvis. -PET scan 03/20/2024-right upper lobe pulmonary mass is intensely hypermetabolic.  No other evidence of metastatic disease.  Specifically no hypermetabolic right hilar adenopathy.  Other scattered small pulmonary nodules are stable and without hypermetabolic activity although are suboptimally evaluated based on size. -04/01/2024 biopsy right lung mass-adenocarcinoma with morphologic features typical of metastatic colonic adenocarcinoma; IHC stains show cytokeratin 20 positive, CDX2/TTF-1/Napsin A negative.  MMR with preserved expression; PD-L1 TPS 0%; foundation 1-microsatellite stable, tumor mutation burden 16, KRAS/NRAS wild-type, FGFR1 amplification, RB1 loss -04/08/2024 tumor board discussion-surgical consult for resection of right lung mass, radiation not recommended due to size; continue to monitor left lung lesion -Appointment with Dr. Kerrin 05/05/2024 Delayed nausea following cycle 2 FOLFOX. Aloxi  was added with cycle 3. Persistent delayed nausea. Emend was added with cycle 4. She noted improvement in the nausea following cycle 4. She declined steroid prophylaxis.   Tiny lung nodules seen on a CT of the chest 10/19/2011. Stable on CT of the chest 10/12/2013 and 10/14/2014 History of tobacco use Endometrial thickening on CT of the abdomen 10/02/2011. Progressive endometrial thickening on CT 09/30/2012. Status post removal of an endometrial polyp 02/03/2013 with benign pathology. Port-A-Cath placement 01/09/2012 in interventional radiology. Removed 12/30/2012. 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 The finger numbness has  improved.   Mild thrombocytopenia secondary to chemotherapy-resolved. History of neutropenia  secondary to chemotherapy. 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 08/17/2023 CT chest-3.7 cm right upper lobe mass and 10 mm left upper lobe nodule CTs 03/07/2024-interval enlargement of a spiculated subpleural mass peripheral right upper lobe measuring 4.7 x 2.9 cm previously 3.7 x 2.5 cm.  Newly enlarged right hilar node consistent with nodal metastatic disease.  Unchanged rounded left upper lobe nodule measuring 1.0 x 0.8 cm.  Multiple additional tiny nodules scattered in the apices unchanged.  No evidence of lymphadenopathy or metastatic disease in the abdomen or pelvis. PET scan 03/20/2024-enlarging right upper lobe pulmonary mass intensely hypermetabolic.  No other evidence of metastatic disease.  Specifically no hypermetabolic right hilar adenopathy.  Other scattered small pulmonary nodules are stable and without hypermetabolic activity. 04/01/2024 biopsy right lung mass-adenocarcinoma with morphologic features typical of metastatic colonic adenocarcinoma; IHC stains show cytokeratin 20 positive, CDX2/TTF-1/Napsin A negative. MMR with preserved expression. PD-L1 TPS 0%; foundation 1-microsatellite stable, tumor mutation burden 16, KRAS/NRAS wild-type, FGFR1 amplification, RB1 loss Referred to Dr. 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 Cycle 1 Alimta /cisplatin  07/01/2024 Cycle 2 Alimta /cisplatin   07/23/2024  Postoperative atrial fibrillation November 2025 Patient report of weight loss 03/13/2024  Disposition: Ms. Brandner appears stable.  She has completed 1 cycle of Alimta /cisplatin .  She seems to have tolerated well.  Plan to proceed with cycle 2 as scheduled 07/23/2024.  CBC and chemistry panel reviewed.  Labs are adequate for treatment.  She will return for follow-up and cycle 3 chemotherapy in 3 weeks.  We are available to see her sooner if needed.    Olam Ned ANP/GNP-BC   07/22/2024  1:09 PM        "

## 2024-07-23 ENCOUNTER — Inpatient Hospital Stay

## 2024-07-23 VITALS — BP 171/68 | HR 53 | Temp 98.1°F | Resp 18

## 2024-07-23 DIAGNOSIS — Z5111 Encounter for antineoplastic chemotherapy: Secondary | ICD-10-CM | POA: Diagnosis not present

## 2024-07-23 DIAGNOSIS — C7A1 Malignant poorly differentiated neuroendocrine tumors: Secondary | ICD-10-CM

## 2024-07-23 MED ORDER — DEXAMETHASONE SOD PHOSPHATE PF 10 MG/ML IJ SOLN
10.0000 mg | Freq: Once | INTRAMUSCULAR | Status: AC
  Administered 2024-07-23: 10 mg via INTRAVENOUS

## 2024-07-23 MED ORDER — SODIUM CHLORIDE 0.9 % IV SOLN
50.0000 mg/m2 | Freq: Once | INTRAVENOUS | Status: AC
  Administered 2024-07-23: 84 mg via INTRAVENOUS
  Filled 2024-07-23: qty 84

## 2024-07-23 MED ORDER — MAGNESIUM SULFATE 2 GM/50ML IV SOLN
2.0000 g | Freq: Once | INTRAVENOUS | Status: AC
  Administered 2024-07-23: 2 g via INTRAVENOUS
  Filled 2024-07-23: qty 50

## 2024-07-23 MED ORDER — PALONOSETRON HCL INJECTION 0.25 MG/5ML
0.2500 mg | Freq: Once | INTRAVENOUS | Status: AC
  Administered 2024-07-23: 0.25 mg via INTRAVENOUS
  Filled 2024-07-23: qty 5

## 2024-07-23 MED ORDER — SODIUM CHLORIDE 0.9 % IV SOLN: INTRAVENOUS | Status: DC

## 2024-07-23 MED ORDER — SODIUM CHLORIDE 0.9 % IV SOLN
150.0000 mg | Freq: Once | INTRAVENOUS | Status: AC
  Administered 2024-07-23: 150 mg via INTRAVENOUS
  Filled 2024-07-23: qty 150

## 2024-07-23 MED ORDER — POTASSIUM CHLORIDE IN NACL 20-0.9 MEQ/L-% IV SOLN
Freq: Once | INTRAVENOUS | Status: AC
  Filled 2024-07-23: qty 1000

## 2024-07-23 MED ORDER — DEXAMETHASONE SOD PHOSPHATE PF 10 MG/ML IJ SOLN: 10.0000 mg | Freq: Once | INTRAMUSCULAR | Status: DC

## 2024-07-23 MED ORDER — SODIUM CHLORIDE 0.9 % IV SOLN
500.0000 mg/m2 | Freq: Once | INTRAVENOUS | Status: AC
  Administered 2024-07-23: 800 mg via INTRAVENOUS
  Filled 2024-07-23: qty 20

## 2024-07-23 NOTE — Patient Instructions (Signed)
 Cisplatin Injection What is this medication? CISPLATIN (SIS pla tin) treats some types of cancer. It works by slowing down the growth of cancer cells. This medicine may be used for other purposes; ask your health care provider or pharmacist if you have questions. COMMON BRAND NAME(S): Platinol, Platinol -AQ What should I tell my care team before I take this medication? They need to know if you have any of these conditions: Eye disease, vision problems Hearing problems Kidney disease Low blood counts, such as low white cells, platelets, or red blood cells Tingling of the fingers or toes, or other nerve disorder An unusual or allergic reaction to cisplatin, carboplatin, oxaliplatin, other medications, foods, dyes, or preservatives If you or your partner are pregnant or trying to get pregnant Breast-feeding How should I use this medication? This medication is injected into a vein. It is given by your care team in a hospital or clinic setting. Talk to your care team about the use of this medication in children. Special care may be needed. Overdosage: If you think you have taken too much of this medicine contact a poison control center or emergency room at once. NOTE: This medicine is only for you. Do not share this medicine with others. What if I miss a dose? Keep appointments for follow-up doses. It is important not to miss your dose. Call your care team if you are unable to keep an appointment. What may interact with this medication? Do not take this medication with any of the following: Live virus vaccines This medication may also interact with the following: Certain antibiotics, such as amikacin, gentamicin, neomycin, polymyxin B, streptomycin, tobramycin, vancomycin Foscarnet This list may not describe all possible interactions. Give your health care provider a list of all the medicines, herbs, non-prescription drugs, or dietary supplements you use. Also tell them if you smoke, drink  alcohol, or use illegal drugs. Some items may interact with your medicine. What should I watch for while using this medication? Your condition will be monitored carefully while you are receiving this medication. You may need blood work done while taking this medication. This medication may make you feel generally unwell. This is not uncommon, as chemotherapy can affect healthy cells as well as cancer cells. Report any side effects. Continue your course of treatment even though you feel ill unless your care team tells you to stop. This medication may increase your risk of getting an infection. Call your care team for advice if you get a fever, chills, sore throat, or other symptoms of a cold or flu. Do not treat yourself. Try to avoid being around people who are sick. Avoid taking medications that contain aspirin, acetaminophen, ibuprofen, naproxen, or ketoprofen unless instructed by your care team. These medications may hide a fever. This medication may increase your risk to bruise or bleed. Call your care team if you notice any unusual bleeding. Be careful brushing or flossing your teeth or using a toothpick because you may get an infection or bleed more easily. If you have any dental work done, tell your dentist you are receiving this medication. Drink fluids as directed while you are taking this medication. This will help protect your kidneys. Call your care team if you get diarrhea. Do not treat yourself. Talk to your care team if you or your partner wish to become pregnant or think you might be pregnant. This medication can cause serious birth defects if taken during pregnancy and for 14 months after the last dose. A negative pregnancy  test is required before starting this medication. A reliable form of contraception is recommended while taking this medication and for 14 months after the last dose. Talk to your care team about effective forms of contraception. Do not father a child while taking this  medication and for 11 months after the last dose. Use a condom during sex during this time period. Do not breast-feed while taking this medication. This medication may cause infertility. Talk to your care team if you are concerned about your fertility. What side effects may I notice from receiving this medication? Side effects that you should report to your care team as soon as possible: Allergic reactions--skin rash, itching, hives, swelling of the face, lips, tongue, or throat Eye pain, change in vision, vision loss Hearing loss, ringing in ears Infection--fever, chills, cough, sore throat, wounds that don't heal, pain or trouble when passing urine, general feeling of discomfort or being unwell Kidney injury--decrease in the amount of urine, swelling of the ankles, hands, or feet Low red blood cell level--unusual weakness or fatigue, dizziness, headache, trouble breathing Painful swelling, warmth, or redness of the skin, blisters or sores at the infusion site Pain, tingling, or numbness in the hands or feet Unusual bruising or bleeding Side effects that usually do not require medical attention (report to your care team if they continue or are bothersome): Hair loss Nausea Vomiting This list may not describe all possible side effects. Call your doctor for medical advice about side effects. You may report side effects to FDA at 1-800-FDA-1088. Where should I keep my medication? This medication is given in a hospital or clinic. It will not be stored at home. NOTE: This sheet is a summary. It may not cover all possible information. If you have questions about this medicine, talk to your doctor, pharmacist, or health care provider.  2024 [employer]/Gold Standard (2021-10-13 00:00:00)

## 2024-08-11 ENCOUNTER — Inpatient Hospital Stay: Admitting: Oncology

## 2024-08-11 ENCOUNTER — Inpatient Hospital Stay

## 2024-08-12 ENCOUNTER — Inpatient Hospital Stay

## 2024-08-13 ENCOUNTER — Ambulatory Visit (HOSPITAL_BASED_OUTPATIENT_CLINIC_OR_DEPARTMENT_OTHER): Admitting: Cardiology

## 2024-09-01 ENCOUNTER — Inpatient Hospital Stay: Admitting: Nurse Practitioner

## 2024-09-01 ENCOUNTER — Inpatient Hospital Stay

## 2024-09-02 ENCOUNTER — Inpatient Hospital Stay

## 2024-09-24 ENCOUNTER — Ambulatory Visit: Admitting: Audiology

## 2025-01-13 ENCOUNTER — Ambulatory Visit (HOSPITAL_BASED_OUTPATIENT_CLINIC_OR_DEPARTMENT_OTHER): Admitting: Family Medicine
